# Patient Record
Sex: Female | Born: 1985
Health system: Southern US, Community
[De-identification: ages and names within clinical notes are randomized; demographics above are authoritative.]

## PROBLEM LIST (undated history)

## (undated) DIAGNOSIS — E785 Hyperlipidemia, unspecified: Secondary | ICD-10-CM

## (undated) DIAGNOSIS — E119 Type 2 diabetes mellitus without complications: Secondary | ICD-10-CM

## (undated) DIAGNOSIS — D259 Leiomyoma of uterus, unspecified: Secondary | ICD-10-CM

## (undated) DIAGNOSIS — N920 Excessive and frequent menstruation with regular cycle: Secondary | ICD-10-CM

## (undated) DIAGNOSIS — D649 Anemia, unspecified: Secondary | ICD-10-CM

## (undated) DIAGNOSIS — F909 Attention-deficit hyperactivity disorder, unspecified type: Secondary | ICD-10-CM

## (undated) DIAGNOSIS — D219 Benign neoplasm of connective and other soft tissue, unspecified: Secondary | ICD-10-CM

## (undated) DIAGNOSIS — N946 Dysmenorrhea, unspecified: Secondary | ICD-10-CM

## (undated) DIAGNOSIS — D500 Iron deficiency anemia secondary to blood loss (chronic): Secondary | ICD-10-CM

## (undated) HISTORY — DX: Excessive and frequent menstruation with regular cycle: N92.0

## (undated) HISTORY — PX: WISDOM TOOTH EXTRACTION: SHX21

## (undated) HISTORY — DX: Type 2 diabetes mellitus without complications: E11.9

## (undated) HISTORY — PX: NO PAST SURGERIES: SHX2092

## (undated) HISTORY — DX: Benign neoplasm of connective and other soft tissue, unspecified: D21.9

## (undated) HISTORY — DX: Anemia, unspecified: D64.9

---

## 2004-05-11 ENCOUNTER — Ambulatory Visit: Payer: Self-pay | Admitting: Nurse Practitioner

## 2005-07-09 ENCOUNTER — Ambulatory Visit: Payer: Self-pay | Admitting: Nurse Practitioner

## 2005-07-09 ENCOUNTER — Other Ambulatory Visit: Admission: RE | Admit: 2005-07-09 | Discharge: 2005-07-09 | Payer: Self-pay | Admitting: Family Medicine

## 2005-11-07 ENCOUNTER — Ambulatory Visit: Payer: Self-pay | Admitting: Nurse Practitioner

## 2005-11-09 ENCOUNTER — Ambulatory Visit: Payer: Self-pay | Admitting: Nurse Practitioner

## 2008-01-12 ENCOUNTER — Encounter (INDEPENDENT_AMBULATORY_CARE_PROVIDER_SITE_OTHER): Payer: Self-pay | Admitting: Family Medicine

## 2008-01-12 ENCOUNTER — Ambulatory Visit: Payer: Self-pay | Admitting: Internal Medicine

## 2008-01-12 LAB — CONVERTED CEMR LAB
Basophils Absolute: 0 10*3/uL (ref 0.0–0.1)
Basophils Relative: 1 % (ref 0–1)
Eosinophils Absolute: 0.1 10*3/uL (ref 0.0–0.7)
Eosinophils Relative: 2 % (ref 0–5)
HCT: 30.8 % — ABNORMAL LOW (ref 36.0–46.0)
Hemoglobin: 9.2 g/dL — ABNORMAL LOW (ref 12.0–15.0)
Lymphocytes Relative: 32 % (ref 12–46)
Lymphs Abs: 1.6 10*3/uL (ref 0.7–4.0)
MCHC: 29.9 g/dL — ABNORMAL LOW (ref 30.0–36.0)
MCV: 70 fL — ABNORMAL LOW (ref 78.0–100.0)
Monocytes Absolute: 0.5 10*3/uL (ref 0.1–1.0)
Monocytes Relative: 10 % (ref 3–12)
Neutro Abs: 2.8 10*3/uL (ref 1.7–7.7)
Neutrophils Relative %: 56 % (ref 43–77)
Platelets: 276 10*3/uL (ref 150–400)
RBC: 4.4 M/uL (ref 3.87–5.11)
RDW: 17.2 % — ABNORMAL HIGH (ref 11.5–15.5)
WBC: 4.9 10*3/uL (ref 4.0–10.5)

## 2008-01-13 ENCOUNTER — Ambulatory Visit: Payer: Self-pay | Admitting: *Deleted

## 2008-01-15 ENCOUNTER — Ambulatory Visit (HOSPITAL_COMMUNITY): Admission: RE | Admit: 2008-01-15 | Discharge: 2008-01-15 | Payer: Self-pay | Admitting: Internal Medicine

## 2008-03-05 ENCOUNTER — Ambulatory Visit: Payer: Self-pay | Admitting: Family Medicine

## 2008-03-05 ENCOUNTER — Encounter (INDEPENDENT_AMBULATORY_CARE_PROVIDER_SITE_OTHER): Payer: Self-pay | Admitting: Family Medicine

## 2008-03-05 LAB — CONVERTED CEMR LAB
ALT: 11 units/L (ref 0–35)
AST: 16 units/L (ref 0–37)
Albumin: 4.5 g/dL (ref 3.5–5.2)
Alkaline Phosphatase: 41 units/L (ref 39–117)
BUN: 11 mg/dL (ref 6–23)
Basophils Absolute: 0 10*3/uL (ref 0.0–0.1)
Basophils Relative: 1 % (ref 0–1)
CO2: 23 meq/L (ref 19–32)
Calcium: 9.2 mg/dL (ref 8.4–10.5)
Chlamydia, DNA Probe: NEGATIVE
Chloride: 104 meq/L (ref 96–112)
Creatinine, Ser: 1.15 mg/dL (ref 0.40–1.20)
Eosinophils Absolute: 0.1 10*3/uL (ref 0.0–0.7)
Eosinophils Relative: 1 % (ref 0–5)
GC Probe Amp, Genital: NEGATIVE
Glucose, Bld: 80 mg/dL (ref 70–99)
HCT: 29.7 % — ABNORMAL LOW (ref 36.0–46.0)
Hemoglobin: 8.6 g/dL — ABNORMAL LOW (ref 12.0–15.0)
Lymphocytes Relative: 30 % (ref 12–46)
Lymphs Abs: 1.2 10*3/uL (ref 0.7–4.0)
MCHC: 29 g/dL — ABNORMAL LOW (ref 30.0–36.0)
MCV: 70.2 fL — ABNORMAL LOW (ref 78.0–100.0)
Monocytes Absolute: 0.5 10*3/uL (ref 0.1–1.0)
Monocytes Relative: 13 % — ABNORMAL HIGH (ref 3–12)
Neutro Abs: 2.2 10*3/uL (ref 1.7–7.7)
Neutrophils Relative %: 55 % (ref 43–77)
Platelets: 238 10*3/uL (ref 150–400)
Potassium: 4 meq/L (ref 3.5–5.3)
RBC: 4.23 M/uL (ref 3.87–5.11)
RDW: 17.4 % — ABNORMAL HIGH (ref 11.5–15.5)
Sodium: 139 meq/L (ref 135–145)
Total Bilirubin: 0.3 mg/dL (ref 0.3–1.2)
Total Protein: 7.8 g/dL (ref 6.0–8.3)
WBC: 4 10*3/uL (ref 4.0–10.5)

## 2008-10-06 IMAGING — RF DG UGI W/ SMALL BOWEL HIGH DENSITY
14 of 24 series · 14 of 24 positions shown · non-contrast
Comparison: None available

CLINICAL DATA: Positive H pylori, persistent reflux

UPPER GI W/ SMALL BOWEL HIGH DENSITY
TECHNIQUE: Upper GI series performed with high density barium and
effervescent agent. Thin barium also used. Subsequently, serial
images of the small bowel were obtained including spot views of the
terminal ileum.

[Series 1: run · 1 of 1 slices shown (1 of 14)]
[im 1/1]
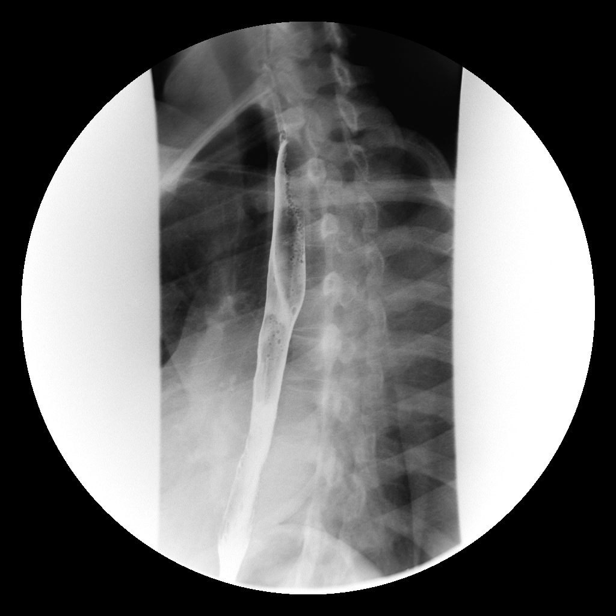

[Series 3: run · 1 of 1 slices shown (2 of 14)]
[im 1/1]
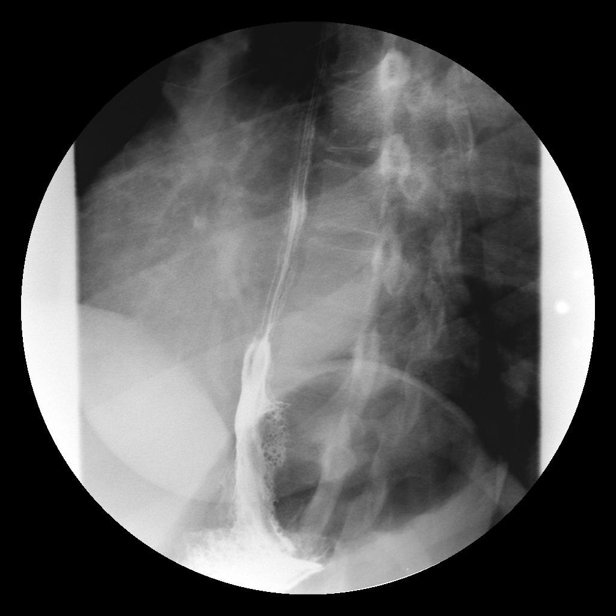

[Series 5: run · 1 of 1 slices shown (3 of 14)]
[im 1/1]
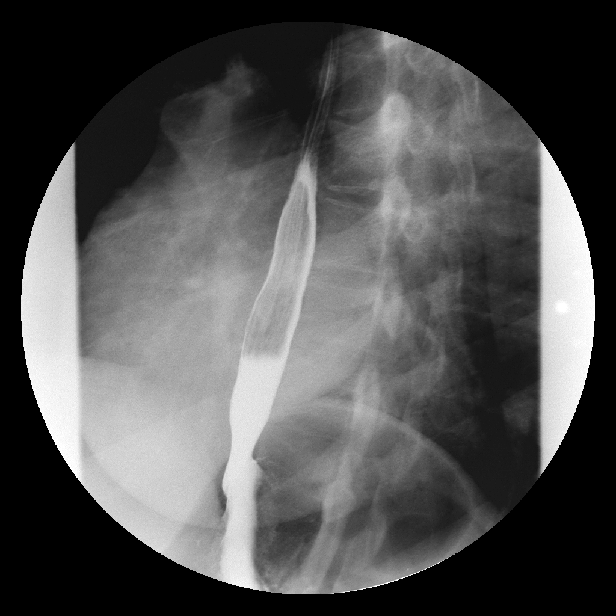

[Series 7: run · 1 of 1 slices shown (4 of 14)]
[im 1/1]
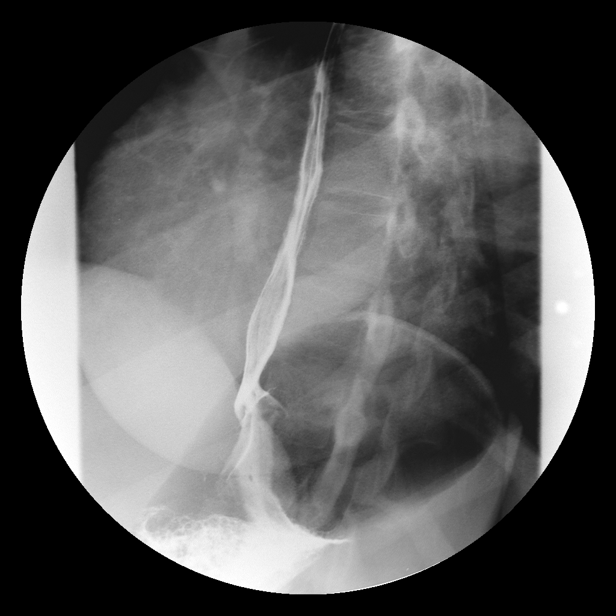

[Series 8: run · 1 of 1 slices shown (5 of 14)]
[im 1/1]
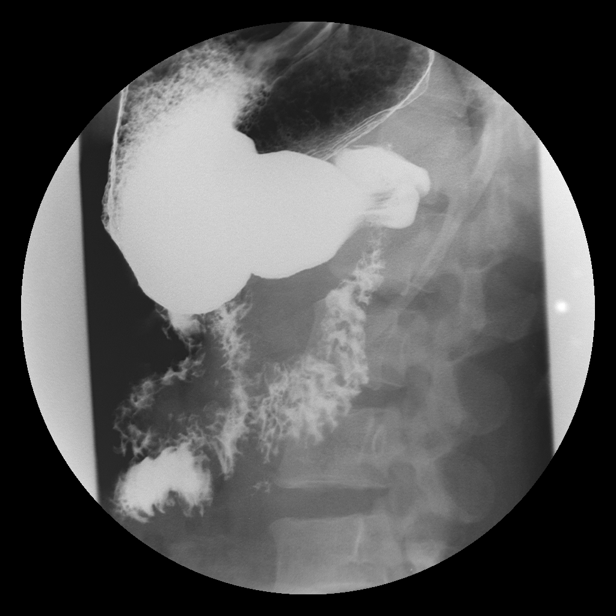

[Series 10: run · 1 of 1 slices shown (6 of 14)]
[im 1/1]
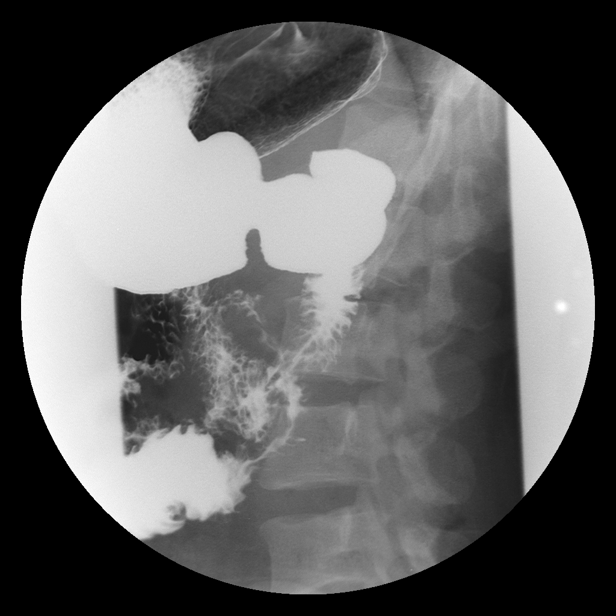

[Series 12: run · 1 of 1 slices shown (7 of 14)]
[im 1/1]
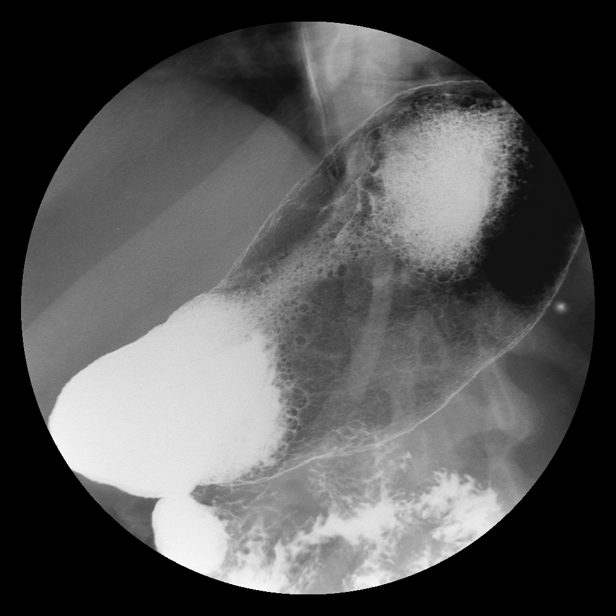

[Series 13: run · 1 of 1 slices shown (8 of 14)]
[im 1/1]
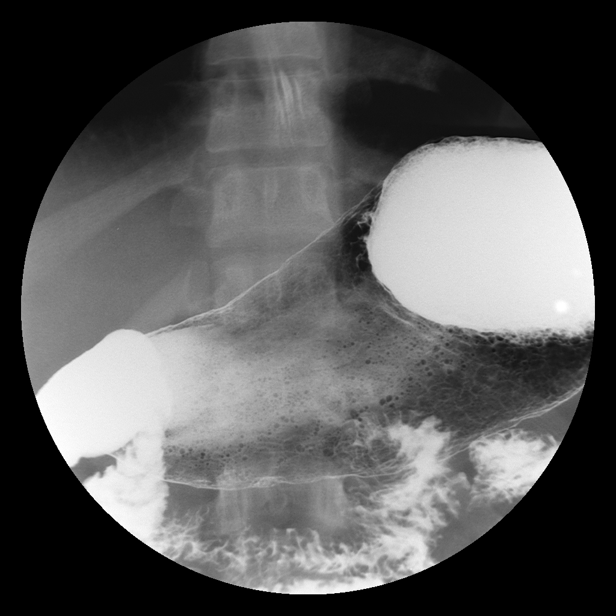

[Series 15: run · 1 of 1 slices shown (9 of 14)]
[im 1/1]
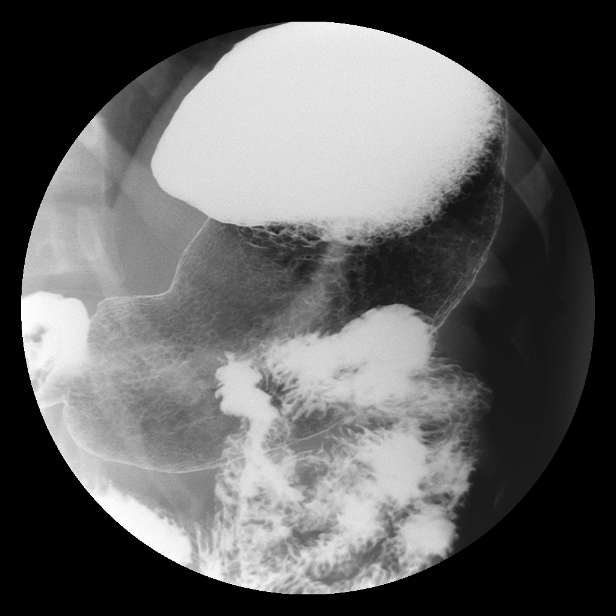

[Series 17: run · 1 of 1 slices shown (10 of 14)]
[im 1/1]
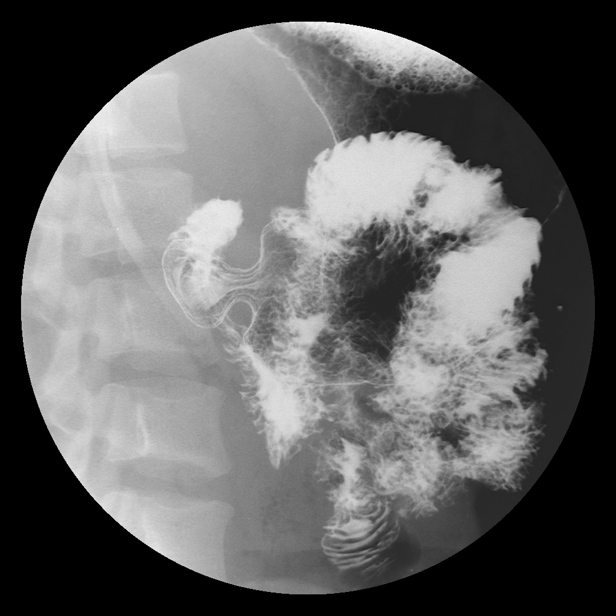

[Series 19: run · 1 of 1 slices shown (11 of 14)]
[im 1/1]
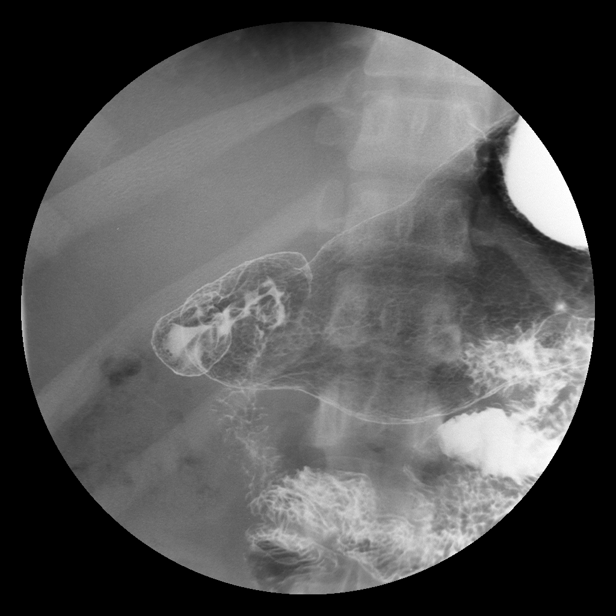

[Series 20: run · 1 of 1 slices shown (12 of 14)]
[im 1/1]
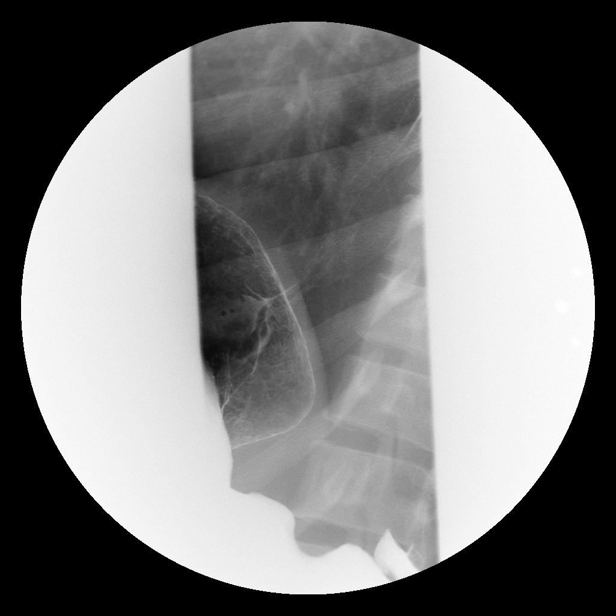

[Series 22: run · 1 of 1 slices shown (13 of 14)]
[im 1/1]
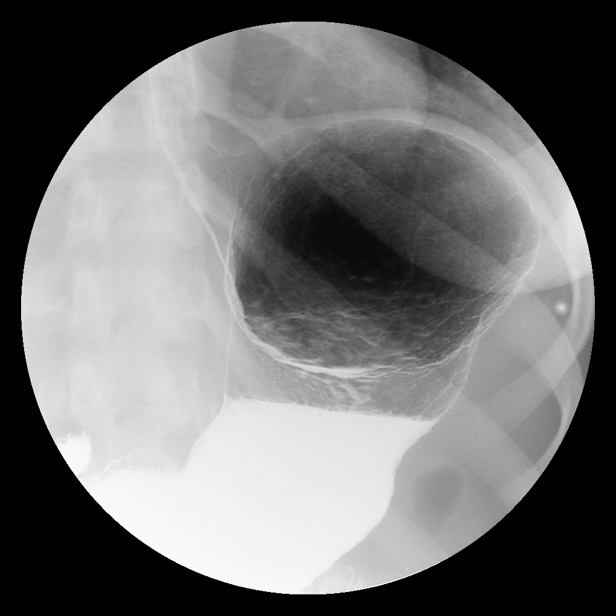

[Series 24: run · 1 of 1 slices shown (14 of 14)]
[im 1/1]
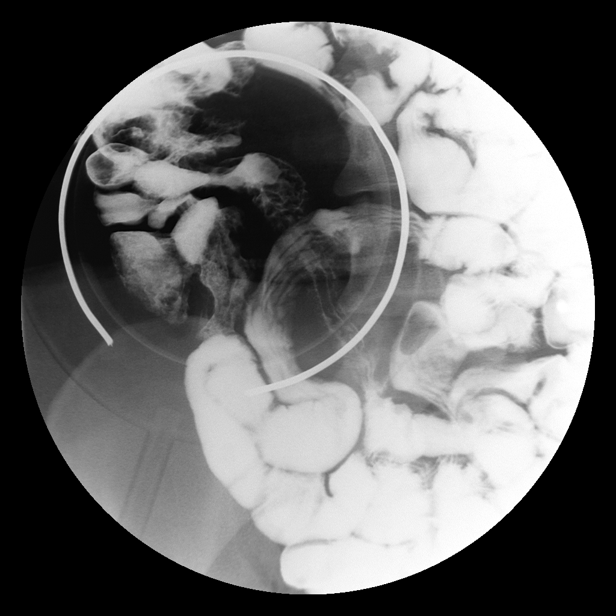

[14 of 24 positions shown; findings below may reference images not displayed]

FINDINGS: Esophagus:  No esophageal mucosal irregularity.  Normal esophageal
motility.  No evidence of distal stricture.  No hiatal hernia.  No
reflux demonstrated during this exam.  13 mm barium tablet passed
GE junction easily.

The stomach - duodenum:  No mucosal irregularity or mass in the
stomach or duodenum.  No evidence of obstruction.  Normal duodenal
C-loop.

Small bowel:  The small bowel transit time equal 40 minutes within
the with normal limits. No evidence of mass or mucosal irregularity
of the small bowel.  The terminal ileum is normal without evidence
of inflammation.
IMPRESSION: 1.  Normal upper GI series and small-bowel follow-through.
2..  No evidence of gastroesophageal reflux during this exam.

## 2009-12-27 ENCOUNTER — Ambulatory Visit: Payer: Self-pay | Admitting: Oncology

## 2009-12-30 LAB — MORPHOLOGY: PLT EST: ADEQUATE

## 2009-12-30 LAB — COMPREHENSIVE METABOLIC PANEL
ALT: 15 U/L (ref 0–35)
AST: 19 U/L (ref 0–37)
Chloride: 103 mEq/L (ref 96–112)
Creatinine, Ser: 0.95 mg/dL (ref 0.40–1.20)
Sodium: 137 mEq/L (ref 135–145)
Total Bilirubin: 0.4 mg/dL (ref 0.3–1.2)
Total Protein: 7.3 g/dL (ref 6.0–8.3)

## 2009-12-30 LAB — IRON AND TIBC
TIBC: 399 ug/dL (ref 250–470)
UIBC: 339 ug/dL

## 2009-12-30 LAB — TSH: TSH: 1.788 u[IU]/mL (ref 0.350–4.500)

## 2009-12-30 LAB — CBC WITH DIFFERENTIAL/PLATELET
BASO%: 0.6 % (ref 0.0–2.0)
LYMPH%: 32.7 % (ref 14.0–49.7)
MCHC: 29.9 g/dL — ABNORMAL LOW (ref 31.5–36.0)
MONO#: 0.3 10*3/uL (ref 0.1–0.9)
MONO%: 8 % (ref 0.0–14.0)
NEUT#: 1.9 10*3/uL (ref 1.5–6.5)
Platelets: 243 10*3/uL (ref 145–400)
RBC: 4.54 10*6/uL (ref 3.70–5.45)
RDW: 24.8 % — ABNORMAL HIGH (ref 11.2–14.5)
WBC: 3.4 10*3/uL — ABNORMAL LOW (ref 3.9–10.3)
nRBC: 0 % (ref 0–0)

## 2009-12-30 LAB — FERRITIN: Ferritin: 14 ng/mL (ref 10–291)

## 2009-12-30 LAB — FOLATE: Folate: >20 ng/mL

## 2010-01-03 LAB — HEMOGLOBINOPATHY EVALUATION
Hemoglobin Other: 0 % (ref 0.0–0.0)
Hgb A2 Quant: 2.2 % (ref 2.2–3.2)
Hgb A: 97.8 % (ref 96.8–97.8)
Hgb F Quant: 0 % (ref 0.0–2.0)
Hgb S Quant: 0 % (ref 0.0–0.0)

## 2010-03-29 ENCOUNTER — Ambulatory Visit: Payer: Self-pay | Admitting: Oncology

## 2010-03-31 LAB — COMPREHENSIVE METABOLIC PANEL
ALT: 15 U/L (ref 0–35)
AST: 18 U/L (ref 0–37)
Albumin: 4 g/dL (ref 3.5–5.2)
Alkaline Phosphatase: 29 U/L — ABNORMAL LOW (ref 39–117)
BUN: 9 mg/dL (ref 6–23)
CO2: 24 mEq/L (ref 19–32)
Calcium: 9.2 mg/dL (ref 8.4–10.5)
Chloride: 104 mEq/L (ref 96–112)
Creatinine, Ser: 0.91 mg/dL (ref 0.40–1.20)
Glucose, Bld: 100 mg/dL — ABNORMAL HIGH (ref 70–99)
Potassium: 4 mEq/L (ref 3.5–5.3)
Sodium: 136 mEq/L (ref 135–145)
Total Bilirubin: 0.2 mg/dL — ABNORMAL LOW (ref 0.3–1.2)
Total Protein: 6.7 g/dL (ref 6.0–8.3)

## 2010-03-31 LAB — CBC WITH DIFFERENTIAL/PLATELET
BASO%: 0.4 % (ref 0.0–2.0)
Basophils Absolute: 0 10*3/uL (ref 0.0–0.1)
EOS%: 1.8 % (ref 0.0–7.0)
Eosinophils Absolute: 0.1 10*3/uL (ref 0.0–0.5)
HCT: 33.7 % — ABNORMAL LOW (ref 34.8–46.6)
HGB: 11.6 g/dL (ref 11.6–15.9)
LYMPH%: 31.2 % (ref 14.0–49.7)
MCH: 28.6 pg (ref 25.1–34.0)
MCHC: 34.5 g/dL (ref 31.5–36.0)
MCV: 82.9 fL (ref 79.5–101.0)
MONO#: 0.5 10*3/uL (ref 0.1–0.9)
MONO%: 9.4 % (ref 0.0–14.0)
NEUT#: 2.9 10*3/uL (ref 1.5–6.5)
NEUT%: 57.2 % (ref 38.4–76.8)
Platelets: 269 10*3/uL (ref 145–400)
RBC: 4.06 10*6/uL (ref 3.70–5.45)
RDW: 13.9 % (ref 11.2–14.5)
WBC: 5.1 10*3/uL (ref 3.9–10.3)
lymph#: 1.6 10*3/uL (ref 0.9–3.3)

## 2010-03-31 LAB — IRON AND TIBC
%SAT: 72 % — ABNORMAL HIGH (ref 20–55)
Iron: 291 ug/dL — ABNORMAL HIGH (ref 42–145)
TIBC: 404 ug/dL (ref 250–470)
UIBC: 113 ug/dL

## 2010-03-31 LAB — HCG, SERUM, QUALITATIVE: Preg, Serum: NEGATIVE

## 2010-03-31 LAB — FERRITIN: Ferritin: 8 ng/mL — ABNORMAL LOW (ref 10–291)

## 2010-06-23 ENCOUNTER — Ambulatory Visit: Payer: Self-pay | Admitting: Oncology

## 2010-06-30 LAB — IRON AND TIBC
%SAT: 11 % — ABNORMAL LOW (ref 20–55)
Iron: 44 ug/dL (ref 42–145)
TIBC: 387 ug/dL (ref 250–470)
UIBC: 343 ug/dL

## 2010-06-30 LAB — FERRITIN: Ferritin: 14 ng/mL (ref 10–291)

## 2010-06-30 LAB — CBC WITH DIFFERENTIAL/PLATELET
BASO%: 0.3 % (ref 0.0–2.0)
Basophils Absolute: 0 10*3/uL (ref 0.0–0.1)
EOS%: 1.4 % (ref 0.0–7.0)
Eosinophils Absolute: 0.1 10*3/uL (ref 0.0–0.5)
HCT: 33 % — ABNORMAL LOW (ref 34.8–46.6)
HGB: 11.2 g/dL — ABNORMAL LOW (ref 11.6–15.9)
LYMPH%: 27.4 % (ref 14.0–49.7)
MCH: 29 pg (ref 25.1–34.0)
MCHC: 34 g/dL (ref 31.5–36.0)
MCV: 85.1 fL (ref 79.5–101.0)
MONO#: 0.3 10*3/uL (ref 0.1–0.9)
MONO%: 6.2 % (ref 0.0–14.0)
NEUT#: 3 10*3/uL (ref 1.5–6.5)
NEUT%: 64.7 % (ref 38.4–76.8)
Platelets: 303 10*3/uL (ref 145–400)
RBC: 3.87 10*6/uL (ref 3.70–5.45)
RDW: 12.3 % (ref 11.2–14.5)
WBC: 4.6 10*3/uL (ref 3.9–10.3)
lymph#: 1.3 10*3/uL (ref 0.9–3.3)

## 2010-06-30 LAB — COMPREHENSIVE METABOLIC PANEL
ALT: 15 U/L (ref 0–35)
AST: 15 U/L (ref 0–37)
Albumin: 4.2 g/dL (ref 3.5–5.2)
Alkaline Phosphatase: 30 U/L — ABNORMAL LOW (ref 39–117)
BUN: 12 mg/dL (ref 6–23)
CO2: 24 mEq/L (ref 19–32)
Calcium: 8.9 mg/dL (ref 8.4–10.5)
Chloride: 104 mEq/L (ref 96–112)
Creatinine, Ser: 1.07 mg/dL (ref 0.40–1.20)
Glucose, Bld: 121 mg/dL — ABNORMAL HIGH (ref 70–99)
Potassium: 3.9 mEq/L (ref 3.5–5.3)
Sodium: 139 mEq/L (ref 135–145)
Total Bilirubin: 0.3 mg/dL (ref 0.3–1.2)
Total Protein: 6.9 g/dL (ref 6.0–8.3)

## 2011-08-23 ENCOUNTER — Other Ambulatory Visit: Payer: Self-pay | Admitting: Family Medicine

## 2012-08-14 ENCOUNTER — Encounter (HOSPITAL_COMMUNITY): Payer: Self-pay | Admitting: Cardiology

## 2012-08-14 ENCOUNTER — Emergency Department (HOSPITAL_COMMUNITY)
Admission: EM | Admit: 2012-08-14 | Discharge: 2012-08-14 | Disposition: A | Discharge status: Home / Self Care | Payer: No Typology Code available for payment source | Attending: Emergency Medicine | Admitting: Emergency Medicine

## 2012-08-14 DIAGNOSIS — Y9389 Activity, other specified: Secondary | ICD-10-CM | POA: Insufficient documentation

## 2012-08-14 DIAGNOSIS — Y92410 Unspecified street and highway as the place of occurrence of the external cause: Secondary | ICD-10-CM | POA: Insufficient documentation

## 2012-08-14 DIAGNOSIS — IMO0002 Reserved for concepts with insufficient information to code with codable children: Secondary | ICD-10-CM | POA: Insufficient documentation

## 2012-08-14 MED ORDER — MELOXICAM 15 MG PO TABS: 15.0000 mg | ORAL_TABLET | Freq: Every day | ORAL | Status: DC
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
## 2012-08-15 NOTE — ED Provider Notes (Signed)
Medical screening examination/treatment/procedure(s) were performed by non-physician practitioner and as supervising physician I was immediately available for consultation/collaboration.  Rosell Khouri, MD 08/15/12 0049 

## 2013-02-07 ENCOUNTER — Encounter (HOSPITAL_COMMUNITY): Payer: Self-pay | Admitting: *Deleted

## 2013-02-07 ENCOUNTER — Emergency Department (HOSPITAL_COMMUNITY)
Admission: EM | Admit: 2013-02-07 | Discharge: 2013-02-07 | Disposition: A | Discharge status: Home / Self Care | Payer: PRIVATE HEALTH INSURANCE | Attending: Emergency Medicine | Admitting: Emergency Medicine

## 2013-02-07 DIAGNOSIS — Y9210 Unspecified residential institution as the place of occurrence of the external cause: Secondary | ICD-10-CM | POA: Insufficient documentation

## 2013-02-07 DIAGNOSIS — X500XXA Overexertion from strenuous movement or load, initial encounter: Secondary | ICD-10-CM | POA: Insufficient documentation

## 2013-02-07 DIAGNOSIS — S63509A Unspecified sprain of unspecified wrist, initial encounter: Secondary | ICD-10-CM | POA: Insufficient documentation

## 2013-02-07 DIAGNOSIS — Y93F2 Activity, caregiving, lifting: Secondary | ICD-10-CM | POA: Insufficient documentation

## 2013-02-07 DIAGNOSIS — S66911A Strain of unspecified muscle, fascia and tendon at wrist and hand level, right hand, initial encounter: Secondary | ICD-10-CM

## 2013-02-07 MED ORDER — IBUPROFEN 800 MG PO TABS: 800.0000 mg | ORAL_TABLET | Freq: Three times a day (TID) | ORAL | Status: DC

## 2013-02-07 NOTE — ED Provider Notes (Signed)
  CSN: [id number]     Arrival date & time 02/07/13  1618 History  This chart was scribed for non-physician practitioner, Fayrene Helper, PA-C working with Gavin Pound. Oletta Lamas, MD by Greggory Stallion, ED scribe. This patient was seen in room TR09C/TR09C and the patient's care was started at 4:37 PM.   Chief Complaint  Patient presents with  . Wrist Pain   The history is provided by the patient. No language interpreter was used.    HPI Comments: Tonya Gilbert is a 27 y.o. female who presents to the Emergency Department complaining of sudden onset, constant right wrist pain that started about 3 hours ago. 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 She did not hear a snap or pop in her wrist. Pt states the pain is increased when pushing and turning beds. She has wrapped it and put ice on it with some relief. Pt has never injured this wrist before.   History reviewed. No pertinent past medical history. History reviewed. No pertinent past surgical history. No family history on file. History  Substance Use Topics  . Smoking status: Never Smoker   . Smokeless tobacco: Not on file  . Alcohol Use: No   OB History   Grav Para Term Preterm Abortions TAB SAB Ect Mult Living                 Review of Systems  Musculoskeletal: Positive for arthralgias.  All other systems reviewed and are negative.    Allergies  Bee venom  Home Medications   Current Outpatient Rx  Name  Route  Sig  Dispense  Refill  . ibuprofen (ADVIL,MOTRIN) 200 MG tablet   Oral   Take 400 mg by mouth every 6 (six) hours as needed for pain.          BP 123/85  Pulse 67  Temp(Src) 98.6 F (37 C) (Oral)  Resp 16  SpO2 100%  LMP 02/01/2013  Physical Exam  Nursing note and vitals reviewed. Constitutional: She is oriented to person, place, and time. She appears well-developed and well-nourished. No distress.  HENT:  Head: Normocephalic and atraumatic.  Eyes: EOM are normal.  Neck: Neck supple. No  tracheal deviation present.  Cardiovascular: Normal rate.   Pulmonary/Chest: Effort normal. No respiratory distress.  Musculoskeletal: Normal range of motion.  Tenderness to ulnar aspects of right wrist. Normal wrist flexion and extension with some pain on lateral movement. Some pain with supination. No overlying skin changes. No deformity noted. Good grip strength.   Neurological: She is alert and oriented to person, place, and time.  Skin: Skin is warm and dry.  Psychiatric: She has a normal mood and affect. Her behavior is normal.    ED Course   Procedures (including critical care time)  DIAGNOSTIC STUDIES: Oxygen Saturation is 100% on RA, normal by my interpretation.    COORDINATION OF CARE: 5:10 PM-Discussed treatment plan which includes ibuprofen and re wrapping her wrist with pt at bedside and pt agreed to plan.   Labs Reviewed - No data to display No results found. 1. Wrist strain, right, initial encounter     MDM  BP 123/85  Pulse 67  Temp(Src) 98.6 F (37 C) (Oral)  Resp 16  SpO2 100%  LMP 02/01/2013  I personally performed the services described in this documentation, which was scribed in my presence. The recorded information has been reviewed and is accurate.    Fayrene Helper, PA-C 02/07/13 1712
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
## 2013-02-08 NOTE — ED Provider Notes (Signed)
Medical screening examination/treatment/procedure(s) were performed by non-physician practitioner and as supervising physician I was immediately available for consultation/collaboration.   Aliyyah Riese Y. Joniel Graumann, MD 02/08/13 1738 

## 2013-03-11 ENCOUNTER — Emergency Department (HOSPITAL_COMMUNITY)
Admission: EM | Admit: 2013-03-11 | Discharge: 2013-03-11 | Disposition: A | Discharge status: Home / Self Care | Payer: Self-pay | Attending: Emergency Medicine | Admitting: Emergency Medicine

## 2013-03-11 ENCOUNTER — Encounter (HOSPITAL_COMMUNITY): Payer: Self-pay | Admitting: Physical Medicine and Rehabilitation

## 2013-03-11 DIAGNOSIS — Z3202 Encounter for pregnancy test, result negative: Secondary | ICD-10-CM | POA: Insufficient documentation

## 2013-03-11 DIAGNOSIS — Z791 Long term (current) use of non-steroidal anti-inflammatories (NSAID): Secondary | ICD-10-CM | POA: Insufficient documentation

## 2013-03-11 DIAGNOSIS — R55 Syncope and collapse: Secondary | ICD-10-CM | POA: Insufficient documentation

## 2013-03-11 DIAGNOSIS — R42 Dizziness and giddiness: Secondary | ICD-10-CM | POA: Insufficient documentation

## 2013-03-11 DIAGNOSIS — D649 Anemia, unspecified: Secondary | ICD-10-CM | POA: Insufficient documentation

## 2013-03-11 LAB — COMPREHENSIVE METABOLIC PANEL
ALT: 15 U/L (ref 0–35)
Albumin: 3.9 g/dL (ref 3.5–5.2)
Alkaline Phosphatase: 45 U/L (ref 39–117)
Chloride: 98 mEq/L (ref 96–112)
Potassium: 3.7 mEq/L (ref 3.5–5.1)
Sodium: 133 mEq/L — ABNORMAL LOW (ref 135–145)
Total Bilirubin: 0.1 mg/dL — ABNORMAL LOW (ref 0.3–1.2)
Total Protein: 8 g/dL (ref 6.0–8.3)

## 2013-03-11 LAB — CBC WITH DIFFERENTIAL/PLATELET
Basophils Absolute: 0.1 10*3/uL (ref 0.0–0.1)
Basophils Relative: 1 % (ref 0–1)
Eosinophils Absolute: 0.1 10*3/uL (ref 0.0–0.7)
HCT: 29 % — ABNORMAL LOW (ref 36.0–46.0)
Hemoglobin: 8.4 g/dL — ABNORMAL LOW (ref 12.0–15.0)
Lymphocytes Relative: 39 % (ref 12–46)
MCHC: 29 g/dL — ABNORMAL LOW (ref 30.0–36.0)
Monocytes Relative: 10 % (ref 3–12)
Neutrophils Relative %: 48 % (ref 43–77)
WBC: 5.7 10*3/uL (ref 4.0–10.5)

## 2013-03-11 NOTE — ED Notes (Signed)
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 Upon reaching destination, patient began to feel dizzy (as if the room was spinning) and felt as if she was going to pass out. Was assisted by staff. Sat down, rested and drank some water. Felt better. Denies any diaphoresis, chills, nausea, vomiting or headache. LMP 2 weeks ago. Last meal was at 1430. No hx seizures or syncope. Patient AAOx4, resp e/u, neuro intact and NAD.

## 2013-03-11 NOTE — ED Provider Notes (Signed)
CSN: [id number]     Arrival date & time 03/11/13  1526 History   First MD Initiated Contact with Patient 03/11/13 1658     Chief Complaint  Patient presents with  . Near Syncope   (Consider location/radiation/quality/duration/timing/severity/associated sxs/prior Treatment) HPI Comments: Pt states she walked up several flights of stairs, became hot, dizzy (room spinning), and felt like she would pass out.  No SOB, CP, palpitations, n/v, d/a, recent illness, injury, h/a, numbness, weakness.  Symptoms since resolved, pt able to ambulate to bathroom w/o symtpoms. Hx of similar 2-3 times a months when weather is hot.   Patient is a 27 y.o. female presenting with neurologic complaint.  Neurologic Problem This is a new problem. The current episode started 1 to 2 hours ago. The problem occurs rarely. The problem has been resolved. Pertinent negatives include no chest pain, no abdominal pain, no headaches and no shortness of breath. The symptoms are aggravated by standing. The symptoms are relieved by lying down. She has tried water for the symptoms. The treatment provided moderate relief.    No past medical history on file. No past surgical history on file. No family history on file. History  Substance Use Topics  . Smoking status: Never Smoker   . Smokeless tobacco: Not on file  . Alcohol Use: No   OB History   Grav Para Term Preterm Abortions TAB SAB Ect Mult Living                 Review of Systems  Constitutional: Negative for fever, chills, diaphoresis, activity change, appetite change and fatigue.  HENT: Negative for congestion, sore throat, facial swelling, rhinorrhea, neck pain and neck stiffness.   Eyes: Negative for photophobia and discharge.  Respiratory: Negative for cough, chest tightness and shortness of breath.   Cardiovascular: Negative for chest pain, palpitations and leg swelling.  Gastrointestinal: Negative for nausea, vomiting, abdominal pain and diarrhea.  Endocrine:  Negative for polydipsia and polyuria.  Genitourinary: Negative for dysuria, frequency, difficulty urinating and pelvic pain.  Musculoskeletal: Negative for back pain and arthralgias.  Skin: Negative for color change and wound.  Allergic/Immunologic: Negative for immunocompromised state.  Neurological: Positive for dizziness. Negative for facial asymmetry, weakness, numbness and headaches.  Hematological: Does not bruise/bleed easily.  Psychiatric/Behavioral: Negative for confusion and agitation.    Allergies  Bee venom  Home Medications   Current Outpatient Rx  Name  Route  Sig  Dispense  Refill  . ibuprofen (ADVIL,MOTRIN) 800 MG tablet   Oral   Take 1 tablet (800 mg total) by mouth 3 (three) times daily.   21 tablet   0    BP 119/73  Pulse 70  Temp(Src) 98.6 F (37 C) (Oral)  Resp 18  SpO2 100% Physical Exam  Constitutional: She is oriented to person, place, and time. She appears well-developed and well-nourished. No distress.  HENT:  Head: Normocephalic and atraumatic.  Mouth/Throat: No oropharyngeal exudate.  Eyes: Pupils are equal, round, and reactive to light.  Neck: Normal range of motion. Neck supple.  Cardiovascular: Normal rate, regular rhythm and normal heart sounds.  Exam reveals no gallop and no friction rub.   No murmur heard. Pulmonary/Chest: Effort normal and breath sounds normal. No respiratory distress. She has no wheezes. She has no rales.  Abdominal: Soft. Bowel sounds are normal. She exhibits no distension and no mass. There is no tenderness. There is no rebound and no guarding.  Musculoskeletal: Normal range of motion. She exhibits no edema and  no tenderness.  Neurological: She is alert and oriented to person, place, and time. She displays no tremor. No cranial nerve deficit or sensory deficit. She exhibits normal muscle tone. Coordination and gait normal. GCS eye subscore is 4. GCS verbal subscore is 5. GCS motor subscore is 6.  Skin: Skin is warm and  dry.  Psychiatric: She has a normal mood and affect.    ED Course  Procedures (including critical care time) Labs Review Labs Reviewed  CBC WITH DIFFERENTIAL - Abnormal; Notable for the following:    Hemoglobin 8.4 (*)    HCT 29.0 (*)    MCV 63.3 (*)    MCH 18.3 (*)    MCHC 29.0 (*)    RDW 19.2 (*)    All other components within normal limits  COMPREHENSIVE METABOLIC PANEL - Abnormal; Notable for the following:    Sodium 133 (*)    Glucose, Bld 114 (*)    Total Bilirubin 0.1 (*)    All other components within normal limits  GLUCOSE, CAPILLARY - Abnormal; Notable for the following:    Glucose-Capillary 112 (*)    All other components within normal limits  POCT PREGNANCY, URINE   Imaging Review No results found.  MDM   1. Near syncope   2. Anemia    Pt is a 27 y.o. female with Pmhx as above including anemia, who presents with near syncopal episode. Pt states she walked up several flights of stairs, became hot, dizzy (room spinning), and felt like she would pass out.  No SOB, CP, palpitations, n/v, d/a, recent illness, injury, h/a, numbness, weakness.  Symptoms since resolved, pt able to ambulate to bathroom w/o symtpoms. Hx of similar 2-3 times a months when weather is hot. On PE, VSS, pt in NAD, neuro exam unremarkable including Dix-Hallpike. Orthostatics negative. CBC, BMP, glu, POC preg ordered from triage, were unremarkable except for Hb 8.4.  Pt will start taking be multivitamin regularly and will f/u with PCP to discuss whether she should be back on iron.     1. Near syncope   2. Anemia         Shanna Cisco, MD 03/12/13 [phone]

## 2013-03-11 NOTE — ED Notes (Signed)
MD at bedside. 

## 2013-03-11 NOTE — ED Notes (Signed)
Pt presents to department for evaluation of near syncope. Pt was working when she became very dizzy, another staff member helped ease her to floor before falling. States she almost passed out. Now states she feels very tired. She is alert and oriented x4. Denies pain.

## 2013-12-21 ENCOUNTER — Telehealth: Payer: Self-pay | Admitting: Hematology and Oncology

## 2013-12-21 NOTE — Telephone Encounter (Signed)
LEFT MESSAGE FOR PATIENT AND GVE NP APPT FOR 07/06 @ 2 W/DR. [place].  [place] PACKET MAILED

## 2013-12-28 ENCOUNTER — Ambulatory Visit: Payer: Self-pay | Admitting: Hematology and Oncology

## 2013-12-28 ENCOUNTER — Ambulatory Visit: Payer: Self-pay

## 2015-07-01 DIAGNOSIS — E559 Vitamin D deficiency, unspecified: Secondary | ICD-10-CM | POA: Diagnosis not present

## 2015-07-01 DIAGNOSIS — E785 Hyperlipidemia, unspecified: Secondary | ICD-10-CM | POA: Diagnosis not present

## 2015-07-01 DIAGNOSIS — R739 Hyperglycemia, unspecified: Secondary | ICD-10-CM | POA: Diagnosis not present

## 2015-07-01 DIAGNOSIS — E669 Obesity, unspecified: Secondary | ICD-10-CM | POA: Diagnosis not present

## 2015-07-01 DIAGNOSIS — R7303 Prediabetes: Secondary | ICD-10-CM | POA: Diagnosis not present

## 2015-07-01 DIAGNOSIS — R635 Abnormal weight gain: Secondary | ICD-10-CM | POA: Diagnosis not present

## 2015-07-08 DIAGNOSIS — D649 Anemia, unspecified: Secondary | ICD-10-CM | POA: Diagnosis not present

## 2015-07-25 DIAGNOSIS — E559 Vitamin D deficiency, unspecified: Secondary | ICD-10-CM | POA: Diagnosis not present

## 2015-07-25 DIAGNOSIS — R7303 Prediabetes: Secondary | ICD-10-CM | POA: Diagnosis not present

## 2015-07-25 DIAGNOSIS — R739 Hyperglycemia, unspecified: Secondary | ICD-10-CM | POA: Diagnosis not present

## 2015-07-25 DIAGNOSIS — D649 Anemia, unspecified: Secondary | ICD-10-CM | POA: Diagnosis not present

## 2015-07-25 DIAGNOSIS — R635 Abnormal weight gain: Secondary | ICD-10-CM | POA: Diagnosis not present

## 2015-07-25 DIAGNOSIS — E669 Obesity, unspecified: Secondary | ICD-10-CM | POA: Diagnosis not present

## 2015-07-25 DIAGNOSIS — E785 Hyperlipidemia, unspecified: Secondary | ICD-10-CM | POA: Diagnosis not present

## 2015-08-24 DIAGNOSIS — D649 Anemia, unspecified: Secondary | ICD-10-CM | POA: Diagnosis not present

## 2015-08-24 DIAGNOSIS — R7303 Prediabetes: Secondary | ICD-10-CM | POA: Diagnosis not present

## 2015-08-24 DIAGNOSIS — E785 Hyperlipidemia, unspecified: Secondary | ICD-10-CM | POA: Diagnosis not present

## 2015-08-24 DIAGNOSIS — E669 Obesity, unspecified: Secondary | ICD-10-CM | POA: Diagnosis not present

## 2015-08-24 DIAGNOSIS — R635 Abnormal weight gain: Secondary | ICD-10-CM | POA: Diagnosis not present

## 2015-08-24 DIAGNOSIS — R739 Hyperglycemia, unspecified: Secondary | ICD-10-CM | POA: Diagnosis not present

## 2015-08-24 DIAGNOSIS — E559 Vitamin D deficiency, unspecified: Secondary | ICD-10-CM | POA: Diagnosis not present

## 2015-09-01 MED FILL — PHENTERMINE 37.5 MG TABLET: 37.5 | 30 days supply | Qty: 30 | Fill #0

## 2015-09-23 ENCOUNTER — Telehealth: Payer: Self-pay | Admitting: Hematology and Oncology

## 2015-10-05 ENCOUNTER — Telehealth: Payer: Self-pay | Admitting: *Deleted

## 2015-10-05 DIAGNOSIS — E559 Vitamin D deficiency, unspecified: Secondary | ICD-10-CM | POA: Diagnosis not present

## 2015-10-05 DIAGNOSIS — R739 Hyperglycemia, unspecified: Secondary | ICD-10-CM | POA: Diagnosis not present

## 2015-10-05 DIAGNOSIS — D649 Anemia, unspecified: Secondary | ICD-10-CM | POA: Diagnosis not present

## 2015-10-05 DIAGNOSIS — E669 Obesity, unspecified: Secondary | ICD-10-CM | POA: Diagnosis not present

## 2015-10-05 DIAGNOSIS — E785 Hyperlipidemia, unspecified: Secondary | ICD-10-CM | POA: Diagnosis not present

## 2015-10-05 DIAGNOSIS — R635 Abnormal weight gain: Secondary | ICD-10-CM | POA: Diagnosis not present

## 2015-10-05 DIAGNOSIS — R7303 Prediabetes: Secondary | ICD-10-CM | POA: Diagnosis not present

## 2015-10-05 NOTE — Telephone Encounter (Signed)
Patient called and left message regarding referral for iron treatment. I have forwarded message to the lead scheduler

## 2015-10-06 ENCOUNTER — Telehealth: Payer: Self-pay | Admitting: Hematology and Oncology

## 2015-10-06 NOTE — Telephone Encounter (Signed)
Returned patient call re being referred for an iron infusion. Left message for patient asking that she call me re her initial message. No new patient referral information at this time for patient.

## 2015-10-12 DIAGNOSIS — D500 Iron deficiency anemia secondary to blood loss (chronic): Secondary | ICD-10-CM | POA: Diagnosis not present

## 2015-10-24 ENCOUNTER — Ambulatory Visit: Payer: Self-pay | Admitting: Hematology

## 2015-10-25 ENCOUNTER — Ambulatory Visit (HOSPITAL_BASED_OUTPATIENT_CLINIC_OR_DEPARTMENT_OTHER): Payer: 59 | Admitting: Hematology

## 2015-10-25 ENCOUNTER — Ambulatory Visit (HOSPITAL_BASED_OUTPATIENT_CLINIC_OR_DEPARTMENT_OTHER): Payer: 59

## 2015-10-25 ENCOUNTER — Encounter: Payer: Self-pay | Admitting: Hematology

## 2015-10-25 ENCOUNTER — Telehealth: Payer: Self-pay | Admitting: Hematology

## 2015-10-25 ENCOUNTER — Telehealth: Payer: Self-pay | Admitting: *Deleted

## 2015-10-25 VITALS — BP 118/77 | HR 85 | Temp 98.3°F | Resp 17 | Ht 65.0 in | Wt 193.3 lb

## 2015-10-25 DIAGNOSIS — D509 Iron deficiency anemia, unspecified: Secondary | ICD-10-CM | POA: Diagnosis not present

## 2015-10-25 DIAGNOSIS — D473 Essential (hemorrhagic) thrombocythemia: Secondary | ICD-10-CM | POA: Diagnosis not present

## 2015-10-25 DIAGNOSIS — D75838 Other thrombocytosis: Secondary | ICD-10-CM

## 2015-10-25 DIAGNOSIS — R7989 Other specified abnormal findings of blood chemistry: Secondary | ICD-10-CM

## 2015-10-25 LAB — COMPREHENSIVE METABOLIC PANEL
ALT: 15 U/L (ref 0–55)
AST: 16 U/L (ref 5–34)
Albumin: 3.9 g/dL (ref 3.5–5.0)
Alkaline Phosphatase: 51 U/L (ref 40–150)
Anion Gap: 8 mEq/L (ref 3–11)
BUN: 14.8 mg/dL (ref 7.0–26.0)
CO2: 27 meq/L (ref 22–29)
Calcium: 9.5 mg/dL (ref 8.4–10.4)
Chloride: 102 mEq/L (ref 98–109)
Creatinine: 1.2 mg/dL — ABNORMAL HIGH (ref 0.6–1.1)
EGFR: 71 mL/min/{1.73_m2} — AB (ref >90)
GLUCOSE: 94 mg/dL (ref 70–140)
Potassium: 3.9 mEq/L (ref 3.5–5.1)
SODIUM: 138 meq/L (ref 136–145)
TOTAL PROTEIN: 7.7 g/dL (ref 6.4–8.3)

## 2015-10-25 LAB — CBC & DIFF AND RETIC
BASO%: 0.2 % (ref 0.0–2.0)
Basophils Absolute: 0 10*3/uL (ref 0.0–0.1)
EOS ABS: 0.1 10*3/uL (ref 0.0–0.5)
EOS%: 2.3 % (ref 0.0–7.0)
HCT: 38.2 % (ref 34.8–46.6)
HEMOGLOBIN: 12.5 g/dL (ref 11.6–15.9)
Immature Retic Fract: 3.8 % (ref 1.60–10.00)
LYMPH%: 29.4 % (ref 14.0–49.7)
MCH: 26.6 pg (ref 25.1–34.0)
MCHC: 32.7 g/dL (ref 31.5–36.0)
MCV: 81.3 fL (ref 79.5–101.0)
MONO#: 0.5 10*3/uL (ref 0.1–0.9)
MONO%: 8.4 % (ref 0.0–14.0)
NEUT%: 59.7 % (ref 38.4–76.8)
NEUTROS ABS: 3.7 10*3/uL (ref 1.5–6.5)
Platelets: 290 10*3/uL (ref 145–400)
RBC: 4.7 10*6/uL (ref 3.70–5.45)
RDW: 13.9 % (ref 11.2–14.5)
RETIC %: 1.65 % (ref 0.70–2.10)
Retic Ct Abs: 77.55 10*3/uL (ref 33.70–90.70)
WBC: 6.2 10*3/uL (ref 3.9–10.3)
lymph#: 1.8 10*3/uL (ref 0.9–3.3)

## 2015-10-25 NOTE — Telephone Encounter (Signed)
Per staff message and POF I have scheduled appts. Advised scheduler of appts. JMW  

## 2015-10-25 NOTE — Telephone Encounter (Signed)
per pfo to sch pt appt-gave pt copy of avs-MW sch fera-sent back to lab

## 2015-10-26 ENCOUNTER — Encounter: Payer: Self-pay | Admitting: *Deleted

## 2015-10-26 LAB — IRON AND TIBC
%SAT: 45 % (ref 21–57)
Iron: 158 ug/dL — ABNORMAL HIGH (ref 41–142)
TIBC: 355 ug/dL (ref 236–444)
UIBC: 197 ug/dL (ref 120–384)

## 2015-10-26 LAB — FERRITIN: Ferritin: 12 ng/ml (ref 9–269)

## 2015-10-26 MED ORDER — FERROUS SULFATE 325 (65 FE) MG PO TABS: 325.0000 mg | ORAL_TABLET | Freq: Two times a day (BID) | ORAL | Status: DC

## 2015-10-26 MED ORDER — THERA VITAL M PO TABS: 1.0000 | ORAL_TABLET | Freq: Every day | ORAL | Status: DC

## 2015-10-26 NOTE — Progress Notes (Signed)
[name]    HEMATOLOGY/ONCOLOGY CONSULTATION NOTE  Date of Service: 10/26/2015  PCP: Doristine Section Bonsu MD CHIEF COMPLAINTS/PURPOSE OF CONSULTATION:  Iron deficiency anemia.  HISTORY OF PRESENTING ILLNESS:   Tonya Gilbert is a wonderful 30 y.o. female who has been referred to [place] by Dr Benito Mccreedy MD for evaluation and management of  Iron deficiency anemia.  Patient is from [place] (speaks [place]) she is conversant in [place]. Has a history of obesity, iron deficiency anemia, vitamin D deficiency and dyslipidemia and was noted by her primary care physician on labs on 07/08/2015 to have significant anemia with a hemoglobin of 6.9 MCV 58 RDW of 19.3 and platelet count of 421. At the time her ferritin level was 3 and iron saturation of 3%. Vitamin B12 was 1289 folate 18.6. TSH was within normal limits at 0.65. [name]  She notes that she was started on ferrous sulfate 1 tablet daily that she has been taking in a compliant fashion. Notes some mild pica symptoms with ice craving. Minimal fatigue but does not feel it is limiting her work. Has been trying to work out regularly.   Notes that she was given a referral to gastroenterology for a colonoscopy but that they mentioned that they would like to get stool studies first.  Notes regular menstrual periods lasting 7 days with 1-2 heavy days of blood loss. No change in bowel habits. No overt blood in the stools or black stools. No overt hematuria. No other evidence of overt blood loss.  Notes that she has a fairly balanced diet with no dietary restrictions. No fevers/chills/night sweats/unexpected weight loss.  She is on phentermine for weight loss for her obesity.  No other acute new symptoms.  MEDICAL HISTORY:   #1 obesity #2 iron deficiency anemia #3 hypercholesterolemia not on medications  #4 obesity - on phentermine for weight loss #5 history of vitamin D deficiency #6 history of pre-diabetes  SURGICAL HISTORY: No previous  surgeries  SOCIAL HISTORY: Social History   Social History  . Marital Status: Single    Spouse Name: N/A  . Number of Children: N/A  . Years of Education: N/A   Occupational History  . Not on file.   Social History Main Topics  . Smoking status: Never Smoker   . Smokeless tobacco: Not on file  . Alcohol Use: No  . Drug Use: No  . Sexual Activity: Not on file   Other Topics Concern  . Not on file   Social History Narrative    FAMILY HISTORY: No family history of blood disorders or cancer that she is aware of.  ALLERGIES:  is allergic to bee venom.  MEDICATIONS:  Current Outpatient Prescriptions  Medication Sig Dispense Refill  . ferrous sulfate 325 (65 FE) MG tablet Take 1 tablet (325 mg total) by mouth 2 (two) times daily with a meal. 180 tablet 3  . Multiple Vitamins-Minerals (MULTIVITAMIN) tablet Take 1 tablet by mouth daily.    . phentermine (ADIPEX-P) 37.5 MG tablet   2   No current facility-administered medications for this visit.    REVIEW OF SYSTEMS:    10 Point review of Systems was done is negative except as noted above.  PHYSICAL EXAMINATION: ECOG PERFORMANCE STATUS: 1 - Symptomatic but completely ambulatory  . Filed Vitals:   10/25/15 1518  BP: 118/77  Pulse: 85  Temp: 98.3 F (36.8 C)  Resp: 17   Filed Weights   10/25/15 1518  Weight: 193 lb 4.8 oz (87.68 kg)   .  Body mass index is 32.17 kg/(m^2).  GENERAL:alert, in no acute distress and comfortable SKIN: skin color, texture, turgor are normal, no rashes or significant lesions EYES: normal, conjunctiva are pink and non-injected, sclera clear OROPHARYNX:no exudate, no erythema and lips, buccal mucosa, and tongue normal  NECK: supple, no JVD, thyroid normal size, non-tender, without nodularity LYMPH:  no palpable lymphadenopathy in the cervical, axillary or inguinal LUNGS: clear to auscultation with normal respiratory effort HEART: regular rate & rhythm,  no murmurs and no lower  extremity edema ABDOMEN: abdomen soft, non-tender, normoactive bowel sounds , no palpable hepatosplenomegaly Musculoskeletal: no cyanosis of digits and no clubbing  PSYCH: alert & oriented x 3 with fluent speech NEURO: no focal motor/sensory deficits  LABORATORY DATA:  I have reviewed the data as listed  . CBC Latest Ref Rng 10/25/2015 03/11/2013 06/30/2010  WBC 3.9 - 10.3 10e3/uL 6.2 5.7 4.6  Hemoglobin 11.6 - 15.9 g/dL 12.5 8.4(L) 11.2(L)  Hematocrit 34.8 - 46.6 % 38.2 29.0(L) 33.0(L)  Platelets 145 - 400 10e3/uL 290 400 303   . CBC    Component Value Date/Time   WBC 6.2 10/25/2015 1608   WBC 5.7 03/11/2013 1723   RBC 4.70 10/25/2015 1608   RBC 4.58 03/11/2013 1723   HGB 12.5 10/25/2015 1608   HGB 8.4* 03/11/2013 1723   HCT 38.2 10/25/2015 1608   HCT 29.0* 03/11/2013 1723   PLT 290 10/25/2015 1608   PLT 400 03/11/2013 1723   MCV 81.3 10/25/2015 1608   MCV 63.3* 03/11/2013 1723   MCH 26.6 10/25/2015 1608   MCH 18.3* 03/11/2013 1723   MCHC 32.7 10/25/2015 1608   MCHC 29.0* 03/11/2013 1723   RDW 13.9 10/25/2015 1608   RDW 19.2* 03/11/2013 1723   LYMPHSABS 1.8 10/25/2015 1608   LYMPHSABS 2.2 03/11/2013 1723   MONOABS 0.5 10/25/2015 1608   MONOABS 0.6 03/11/2013 1723   EOSABS 0.1 10/25/2015 1608   EOSABS 0.1 03/11/2013 1723   BASOSABS 0.0 10/25/2015 1608   BASOSABS 0.1 03/11/2013 1723     . CMP Latest Ref Rng 10/25/2015 03/11/2013 06/30/2010  Glucose 70 - 140 mg/dl 94 114(H) 121(H)  BUN 7.0 - 26.0 mg/dL 14.8 11 12   Creatinine 0.6 - 1.1 mg/dL 1.2(H) 0.82 1.07  Sodium 136 - 145 mEq/L 138 133(L) 139  Potassium 3.5 - 5.1 mEq/L 3.9 3.7 3.9  Chloride 96 - 112 mEq/L - 98 104  CO2 22 - 29 mEq/L 27 25 24   Calcium 8.4 - 10.4 mg/dL 9.5 9.4 8.9  Total Protein 6.4 - 8.3 g/dL 7.7 8.0 6.9  Total Bilirubin 0.20 - 1.20 mg/dL <0.30 0.1(L) 0.3  Alkaline Phos 40 - 150 U/L 51 45 30(L)  AST 5 - 34 U/L 16 24 15   ALT 0 - 55 U/L 15 15 15      RADIOGRAPHIC STUDIES: I have personally  reviewed the radiological images as listed and agreed with the findings in the report. No results found.  ASSESSMENT & PLAN:   29 year old female from [place] with  #1 Iron deficiency anemia Patient had a hemoglobin of 6.9 with an MCV of 58, ferritin level of 3 with 3% iron saturation in January 2017. Had some mild reactive thrombocytosis at the time and normal WBC. She has been on iron replacement with oral ferrous sulfate 1 tablet by mouth daily. Labs today show that her hemoglobin has normalized at 12.5 and the MCV has normalized to 81.3  #2 reactive thrombocytosis -now normalized after iron replacement . Plan -Patient's labs  have significantly improved with oral iron replacement .this suggests no issues with oral iron absorption .she is tolerating oral iron okay at this point .no significant overt ongoing blood loss. No significant symptoms of iron deficiency at this time . -Would recommend that the patient continue oral ferrous sulfate 1 tablet by mouth twice a day with meals and preferably with orange juice to her ferritin levels are close to 100 to ensure adequate stores . -Patient has already been evaluated by gastroenterology and is in the process of getting stool studies for occult blood .she notes that they will decide on endoscopies based on that . -Will recommend taking a daily multivitamin . -The fact that her hemoglobin and microcytosis completely corrected with iron replacement rules out the possibility of an underlying hemoglobinopathy.  We'll have the patient follow up with her primary care physician in 2 months with repeat labs Cbc, CMP, ferritin to ensure adequate iron replacement .  Please reconsult [place] if any new questions or concerns arise .  All of the patients questions were answered with apparent satisfaction. The patient knows to call the clinic with any problems, questions or concerns.  I spent 45 minutes counseling the patient face to face. The total time  spent in the appointment was 45 minutes and more than 50% was on counseling and direct patient cares.    Sullivan Lone MD [place] AAHIVMS [facility] Hematology/Oncology Physician [facility]  (Office):       [phone] (Work cell):  [phone] (Fax):           [phone]

## 2015-10-26 NOTE — Progress Notes (Signed)
Called patient and left voicemail for her to return my call concerning cancelled infusion/MD appointment due to her labs returning to normal

## 2015-10-28 ENCOUNTER — Ambulatory Visit: Payer: Self-pay

## 2015-11-04 ENCOUNTER — Ambulatory Visit: Payer: Self-pay

## 2015-11-11 ENCOUNTER — Ambulatory Visit: Payer: Self-pay

## 2015-11-14 MED FILL — PHENTERMINE 37.5 MG TABLET: 37.5 | 30 days supply | Qty: 30 | Fill #1

## 2015-11-17 DIAGNOSIS — R635 Abnormal weight gain: Secondary | ICD-10-CM | POA: Diagnosis not present

## 2015-11-17 DIAGNOSIS — R7303 Prediabetes: Secondary | ICD-10-CM | POA: Diagnosis not present

## 2015-11-17 DIAGNOSIS — E785 Hyperlipidemia, unspecified: Secondary | ICD-10-CM | POA: Diagnosis not present

## 2015-11-17 DIAGNOSIS — R739 Hyperglycemia, unspecified: Secondary | ICD-10-CM | POA: Diagnosis not present

## 2015-11-17 DIAGNOSIS — D649 Anemia, unspecified: Secondary | ICD-10-CM | POA: Diagnosis not present

## 2015-11-17 DIAGNOSIS — E559 Vitamin D deficiency, unspecified: Secondary | ICD-10-CM | POA: Diagnosis not present

## 2015-11-17 DIAGNOSIS — E669 Obesity, unspecified: Secondary | ICD-10-CM | POA: Diagnosis not present

## 2015-12-20 ENCOUNTER — Ambulatory Visit: Payer: Self-pay | Admitting: Hematology

## 2015-12-20 ENCOUNTER — Other Ambulatory Visit: Payer: Self-pay

## 2016-01-04 MED FILL — CLINDAMYCIN HCL 150 MG CAPS: 150 | 10 days supply | Qty: 30 | Fill #0

## 2016-01-04 MED FILL — metroNIDAZOLE 500 MG TABS: 500 | 10 days supply | Qty: 30 | Fill #0

## 2016-01-11 MED FILL — PHENTERMINE 37.5 MG TABLET: 37.5 | 30 days supply | Qty: 30 | Fill #2

## 2016-03-02 DIAGNOSIS — R635 Abnormal weight gain: Secondary | ICD-10-CM | POA: Diagnosis not present

## 2016-03-02 DIAGNOSIS — E559 Vitamin D deficiency, unspecified: Secondary | ICD-10-CM | POA: Diagnosis not present

## 2016-03-02 DIAGNOSIS — D649 Anemia, unspecified: Secondary | ICD-10-CM | POA: Diagnosis not present

## 2016-03-02 DIAGNOSIS — E669 Obesity, unspecified: Secondary | ICD-10-CM | POA: Diagnosis not present

## 2016-03-02 DIAGNOSIS — R7303 Prediabetes: Secondary | ICD-10-CM | POA: Diagnosis not present

## 2016-03-02 DIAGNOSIS — E785 Hyperlipidemia, unspecified: Secondary | ICD-10-CM | POA: Diagnosis not present

## 2016-03-02 DIAGNOSIS — R739 Hyperglycemia, unspecified: Secondary | ICD-10-CM | POA: Diagnosis not present

## 2016-03-05 MED FILL — PHENTERMINE 37.5 MG TABLET: 37.5 | 30 days supply | Qty: 30 | Fill #0

## 2016-03-21 DIAGNOSIS — Z6830 Body mass index (BMI) 30.0-30.9, adult: Secondary | ICD-10-CM | POA: Diagnosis not present

## 2016-03-21 DIAGNOSIS — Z1389 Encounter for screening for other disorder: Secondary | ICD-10-CM | POA: Diagnosis not present

## 2016-03-21 DIAGNOSIS — Z01419 Encounter for gynecological examination (general) (routine) without abnormal findings: Secondary | ICD-10-CM | POA: Diagnosis not present

## 2016-03-21 DIAGNOSIS — Z130 Encounter for screening for diseases of the blood and blood-forming organs and certain disorders involving the immune mechanism: Secondary | ICD-10-CM | POA: Diagnosis not present

## 2016-03-21 DIAGNOSIS — Z113 Encounter for screening for infections with a predominantly sexual mode of transmission: Secondary | ICD-10-CM | POA: Diagnosis not present

## 2016-04-17 ENCOUNTER — Ambulatory Visit: Payer: Self-pay

## 2016-04-17 ENCOUNTER — Other Ambulatory Visit: Payer: Self-pay | Admitting: Occupational Medicine

## 2016-04-17 DIAGNOSIS — M79645 Pain in left finger(s): Secondary | ICD-10-CM

## 2016-05-07 MED FILL — PHENTERMINE 37.5 MG TABLET: 37.5 | 30 days supply | Qty: 30 | Fill #1

## 2016-05-29 DIAGNOSIS — D649 Anemia, unspecified: Secondary | ICD-10-CM | POA: Diagnosis not present

## 2016-05-29 DIAGNOSIS — E785 Hyperlipidemia, unspecified: Secondary | ICD-10-CM | POA: Diagnosis not present

## 2016-05-29 DIAGNOSIS — R739 Hyperglycemia, unspecified: Secondary | ICD-10-CM | POA: Diagnosis not present

## 2016-05-29 DIAGNOSIS — R7303 Prediabetes: Secondary | ICD-10-CM | POA: Diagnosis not present

## 2016-05-29 DIAGNOSIS — R632 Polyphagia: Secondary | ICD-10-CM | POA: Diagnosis not present

## 2016-05-29 DIAGNOSIS — E669 Obesity, unspecified: Secondary | ICD-10-CM | POA: Diagnosis not present

## 2016-05-29 DIAGNOSIS — E559 Vitamin D deficiency, unspecified: Secondary | ICD-10-CM | POA: Diagnosis not present

## 2016-05-29 DIAGNOSIS — R635 Abnormal weight gain: Secondary | ICD-10-CM | POA: Diagnosis not present

## 2016-05-30 MED FILL — VYVANSE 10 MG CAPSULE: 10 | 30 days supply | Qty: 30 | Fill #0

## 2016-06-27 DIAGNOSIS — R0981 Nasal congestion: Secondary | ICD-10-CM | POA: Diagnosis not present

## 2016-06-27 DIAGNOSIS — R635 Abnormal weight gain: Secondary | ICD-10-CM | POA: Diagnosis not present

## 2016-06-27 DIAGNOSIS — R632 Polyphagia: Secondary | ICD-10-CM | POA: Diagnosis not present

## 2016-06-27 DIAGNOSIS — E669 Obesity, unspecified: Secondary | ICD-10-CM | POA: Diagnosis not present

## 2016-06-27 DIAGNOSIS — D649 Anemia, unspecified: Secondary | ICD-10-CM | POA: Diagnosis not present

## 2016-06-27 DIAGNOSIS — R739 Hyperglycemia, unspecified: Secondary | ICD-10-CM | POA: Diagnosis not present

## 2016-06-27 DIAGNOSIS — E785 Hyperlipidemia, unspecified: Secondary | ICD-10-CM | POA: Diagnosis not present

## 2016-06-27 DIAGNOSIS — R7303 Prediabetes: Secondary | ICD-10-CM | POA: Diagnosis not present

## 2016-06-27 DIAGNOSIS — E559 Vitamin D deficiency, unspecified: Secondary | ICD-10-CM | POA: Diagnosis not present

## 2016-07-09 MED FILL — VYVANSE 10 MG CAPSULE: 10 | 30 days supply | Qty: 30 | Fill #0

## 2016-07-25 DIAGNOSIS — R0981 Nasal congestion: Secondary | ICD-10-CM | POA: Diagnosis not present

## 2016-07-25 DIAGNOSIS — N760 Acute vaginitis: Secondary | ICD-10-CM | POA: Diagnosis not present

## 2016-07-25 DIAGNOSIS — Z113 Encounter for screening for infections with a predominantly sexual mode of transmission: Secondary | ICD-10-CM | POA: Diagnosis not present

## 2016-07-25 DIAGNOSIS — E669 Obesity, unspecified: Secondary | ICD-10-CM | POA: Diagnosis not present

## 2016-07-25 DIAGNOSIS — R739 Hyperglycemia, unspecified: Secondary | ICD-10-CM | POA: Diagnosis not present

## 2016-07-25 DIAGNOSIS — Z124 Encounter for screening for malignant neoplasm of cervix: Secondary | ICD-10-CM | POA: Diagnosis not present

## 2016-07-25 DIAGNOSIS — R7303 Prediabetes: Secondary | ICD-10-CM | POA: Diagnosis not present

## 2016-07-25 DIAGNOSIS — R635 Abnormal weight gain: Secondary | ICD-10-CM | POA: Diagnosis not present

## 2016-07-25 DIAGNOSIS — D649 Anemia, unspecified: Secondary | ICD-10-CM | POA: Diagnosis not present

## 2016-07-25 DIAGNOSIS — R632 Polyphagia: Secondary | ICD-10-CM | POA: Diagnosis not present

## 2016-07-25 DIAGNOSIS — E785 Hyperlipidemia, unspecified: Secondary | ICD-10-CM | POA: Diagnosis not present

## 2016-07-25 DIAGNOSIS — E559 Vitamin D deficiency, unspecified: Secondary | ICD-10-CM | POA: Diagnosis not present

## 2016-07-25 MED FILL — VANDAZOLE VAGINAL 0.75% GEL: 0.75 | 5 days supply | Qty: 70 | Fill #0

## 2016-11-08 DIAGNOSIS — D649 Anemia, unspecified: Secondary | ICD-10-CM | POA: Diagnosis not present

## 2016-11-08 DIAGNOSIS — H539 Unspecified visual disturbance: Secondary | ICD-10-CM | POA: Diagnosis not present

## 2016-11-08 DIAGNOSIS — Z131 Encounter for screening for diabetes mellitus: Secondary | ICD-10-CM | POA: Diagnosis not present

## 2016-11-08 DIAGNOSIS — E785 Hyperlipidemia, unspecified: Secondary | ICD-10-CM | POA: Diagnosis not present

## 2016-11-08 DIAGNOSIS — Z0000 Encounter for general adult medical examination without abnormal findings: Secondary | ICD-10-CM | POA: Diagnosis not present

## 2016-11-08 DIAGNOSIS — Z0110 Encounter for examination of ears and hearing without abnormal findings: Secondary | ICD-10-CM | POA: Diagnosis not present

## 2016-11-08 DIAGNOSIS — R635 Abnormal weight gain: Secondary | ICD-10-CM | POA: Diagnosis not present

## 2016-11-08 DIAGNOSIS — R632 Polyphagia: Secondary | ICD-10-CM | POA: Diagnosis not present

## 2016-11-08 DIAGNOSIS — E559 Vitamin D deficiency, unspecified: Secondary | ICD-10-CM | POA: Diagnosis not present

## 2016-11-08 DIAGNOSIS — E669 Obesity, unspecified: Secondary | ICD-10-CM | POA: Diagnosis not present

## 2016-11-08 DIAGNOSIS — Z01419 Encounter for gynecological examination (general) (routine) without abnormal findings: Secondary | ICD-10-CM | POA: Diagnosis not present

## 2016-12-03 DIAGNOSIS — E669 Obesity, unspecified: Secondary | ICD-10-CM | POA: Diagnosis not present

## 2016-12-03 DIAGNOSIS — E785 Hyperlipidemia, unspecified: Secondary | ICD-10-CM | POA: Diagnosis not present

## 2016-12-03 DIAGNOSIS — E559 Vitamin D deficiency, unspecified: Secondary | ICD-10-CM | POA: Diagnosis not present

## 2016-12-03 DIAGNOSIS — R635 Abnormal weight gain: Secondary | ICD-10-CM | POA: Diagnosis not present

## 2016-12-03 DIAGNOSIS — F988 Other specified behavioral and emotional disorders with onset usually occurring in childhood and adolescence: Secondary | ICD-10-CM | POA: Diagnosis not present

## 2016-12-03 DIAGNOSIS — D649 Anemia, unspecified: Secondary | ICD-10-CM | POA: Diagnosis not present

## 2016-12-04 MED FILL — AMPHETAMINE SALTS 10 MG TAB: 10 | 30 days supply | Qty: 60 | Fill #0

## 2017-01-01 DIAGNOSIS — R635 Abnormal weight gain: Secondary | ICD-10-CM | POA: Diagnosis not present

## 2017-01-01 DIAGNOSIS — D649 Anemia, unspecified: Secondary | ICD-10-CM | POA: Diagnosis not present

## 2017-01-01 DIAGNOSIS — E559 Vitamin D deficiency, unspecified: Secondary | ICD-10-CM | POA: Diagnosis not present

## 2017-01-01 DIAGNOSIS — E669 Obesity, unspecified: Secondary | ICD-10-CM | POA: Diagnosis not present

## 2017-01-01 DIAGNOSIS — F988 Other specified behavioral and emotional disorders with onset usually occurring in childhood and adolescence: Secondary | ICD-10-CM | POA: Diagnosis not present

## 2017-01-01 DIAGNOSIS — E785 Hyperlipidemia, unspecified: Secondary | ICD-10-CM | POA: Diagnosis not present

## 2017-01-15 MED FILL — DEXTROAMP-AMPHETAMIN 20 MG: 20 | 30 days supply | Qty: 60 | Fill #0

## 2017-03-04 DIAGNOSIS — E785 Hyperlipidemia, unspecified: Secondary | ICD-10-CM | POA: Diagnosis not present

## 2017-03-04 DIAGNOSIS — R635 Abnormal weight gain: Secondary | ICD-10-CM | POA: Diagnosis not present

## 2017-03-04 DIAGNOSIS — E669 Obesity, unspecified: Secondary | ICD-10-CM | POA: Diagnosis not present

## 2017-03-04 DIAGNOSIS — F988 Other specified behavioral and emotional disorders with onset usually occurring in childhood and adolescence: Secondary | ICD-10-CM | POA: Diagnosis not present

## 2017-03-04 DIAGNOSIS — D649 Anemia, unspecified: Secondary | ICD-10-CM | POA: Diagnosis not present

## 2017-03-04 DIAGNOSIS — E559 Vitamin D deficiency, unspecified: Secondary | ICD-10-CM | POA: Diagnosis not present

## 2017-03-05 MED FILL — DEXTROAMP-AMPHETAMIN 20 MG: 20 | 30 days supply | Qty: 60 | Fill #0

## 2017-03-20 DIAGNOSIS — R635 Abnormal weight gain: Secondary | ICD-10-CM | POA: Diagnosis not present

## 2017-03-20 DIAGNOSIS — D649 Anemia, unspecified: Secondary | ICD-10-CM | POA: Diagnosis not present

## 2017-03-20 DIAGNOSIS — F988 Other specified behavioral and emotional disorders with onset usually occurring in childhood and adolescence: Secondary | ICD-10-CM | POA: Diagnosis not present

## 2017-03-20 DIAGNOSIS — E669 Obesity, unspecified: Secondary | ICD-10-CM | POA: Diagnosis not present

## 2017-03-20 DIAGNOSIS — Z298 Encounter for other specified prophylactic measures: Secondary | ICD-10-CM | POA: Diagnosis not present

## 2017-03-20 DIAGNOSIS — E785 Hyperlipidemia, unspecified: Secondary | ICD-10-CM | POA: Diagnosis not present

## 2017-03-20 DIAGNOSIS — E559 Vitamin D deficiency, unspecified: Secondary | ICD-10-CM | POA: Diagnosis not present

## 2017-04-11 MED FILL — DEXTROAMP-AMPHETAMIN 20 MG: 20 | 30 days supply | Qty: 60 | Fill #0

## 2017-04-29 MED FILL — ATOVAQUONE-PROGUANIL 250-10: 250-100 | 36 days supply | Qty: 36 | Fill #0

## 2017-04-29 MED FILL — AZITHROMYCIN 500 MG TABLET: 500 | 3 days supply | Qty: 3 | Fill #0

## 2017-05-09 MED FILL — DEXTROAMP-AMPHETAMIN 20 MG: 20 | 30 days supply | Qty: 60 | Fill #0

## 2017-07-03 DIAGNOSIS — E559 Vitamin D deficiency, unspecified: Secondary | ICD-10-CM | POA: Diagnosis not present

## 2017-07-03 DIAGNOSIS — F988 Other specified behavioral and emotional disorders with onset usually occurring in childhood and adolescence: Secondary | ICD-10-CM | POA: Diagnosis not present

## 2017-07-03 DIAGNOSIS — D649 Anemia, unspecified: Secondary | ICD-10-CM | POA: Diagnosis not present

## 2017-07-03 DIAGNOSIS — E785 Hyperlipidemia, unspecified: Secondary | ICD-10-CM | POA: Diagnosis not present

## 2017-07-03 DIAGNOSIS — D72819 Decreased white blood cell count, unspecified: Secondary | ICD-10-CM | POA: Diagnosis not present

## 2017-07-03 DIAGNOSIS — E669 Obesity, unspecified: Secondary | ICD-10-CM | POA: Diagnosis not present

## 2017-07-03 DIAGNOSIS — R635 Abnormal weight gain: Secondary | ICD-10-CM | POA: Diagnosis not present

## 2017-10-09 DIAGNOSIS — E669 Obesity, unspecified: Secondary | ICD-10-CM | POA: Diagnosis not present

## 2017-10-09 DIAGNOSIS — R635 Abnormal weight gain: Secondary | ICD-10-CM | POA: Diagnosis not present

## 2017-10-09 DIAGNOSIS — E559 Vitamin D deficiency, unspecified: Secondary | ICD-10-CM | POA: Diagnosis not present

## 2017-10-09 DIAGNOSIS — F988 Other specified behavioral and emotional disorders with onset usually occurring in childhood and adolescence: Secondary | ICD-10-CM | POA: Diagnosis not present

## 2017-10-09 DIAGNOSIS — D72819 Decreased white blood cell count, unspecified: Secondary | ICD-10-CM | POA: Diagnosis not present

## 2017-10-09 DIAGNOSIS — Z0110 Encounter for examination of ears and hearing without abnormal findings: Secondary | ICD-10-CM | POA: Diagnosis not present

## 2017-10-09 DIAGNOSIS — Z0100 Encounter for examination of eyes and vision without abnormal findings: Secondary | ICD-10-CM | POA: Diagnosis not present

## 2017-10-09 DIAGNOSIS — E785 Hyperlipidemia, unspecified: Secondary | ICD-10-CM | POA: Diagnosis not present

## 2017-10-09 DIAGNOSIS — Z0000 Encounter for general adult medical examination without abnormal findings: Secondary | ICD-10-CM | POA: Diagnosis not present

## 2017-10-09 DIAGNOSIS — Z136 Encounter for screening for cardiovascular disorders: Secondary | ICD-10-CM | POA: Diagnosis not present

## 2017-10-09 DIAGNOSIS — D649 Anemia, unspecified: Secondary | ICD-10-CM | POA: Diagnosis not present

## 2018-02-19 DIAGNOSIS — E785 Hyperlipidemia, unspecified: Secondary | ICD-10-CM | POA: Diagnosis not present

## 2018-02-19 DIAGNOSIS — R635 Abnormal weight gain: Secondary | ICD-10-CM | POA: Diagnosis not present

## 2018-02-19 DIAGNOSIS — E669 Obesity, unspecified: Secondary | ICD-10-CM | POA: Diagnosis not present

## 2018-02-19 DIAGNOSIS — D649 Anemia, unspecified: Secondary | ICD-10-CM | POA: Diagnosis not present

## 2018-02-19 DIAGNOSIS — D72819 Decreased white blood cell count, unspecified: Secondary | ICD-10-CM | POA: Diagnosis not present

## 2018-02-19 DIAGNOSIS — F988 Other specified behavioral and emotional disorders with onset usually occurring in childhood and adolescence: Secondary | ICD-10-CM | POA: Diagnosis not present

## 2018-02-19 DIAGNOSIS — E559 Vitamin D deficiency, unspecified: Secondary | ICD-10-CM | POA: Diagnosis not present

## 2018-03-11 ENCOUNTER — Telehealth: Payer: Self-pay | Admitting: Hematology

## 2018-03-11 NOTE — Telephone Encounter (Signed)
LVm for pt regarding upcoming appts per 9/13 sch message

## 2018-03-13 MED FILL — DEXTROAMP-AMPHETAMIN 20 MG: 20 | 30 days supply | Qty: 60 | Fill #0

## 2018-03-24 ENCOUNTER — Inpatient Hospital Stay: Payer: 59 | Attending: Hematology | Admitting: Hematology

## 2018-04-16 MED FILL — DEXTROAMP-AMPHETAMIN 20 MG: 20 | 30 days supply | Qty: 60 | Fill #0

## 2018-05-21 DIAGNOSIS — E785 Hyperlipidemia, unspecified: Secondary | ICD-10-CM | POA: Diagnosis not present

## 2018-05-21 DIAGNOSIS — E669 Obesity, unspecified: Secondary | ICD-10-CM | POA: Diagnosis not present

## 2018-05-21 DIAGNOSIS — D649 Anemia, unspecified: Secondary | ICD-10-CM | POA: Diagnosis not present

## 2018-05-21 DIAGNOSIS — R635 Abnormal weight gain: Secondary | ICD-10-CM | POA: Diagnosis not present

## 2018-05-21 DIAGNOSIS — E559 Vitamin D deficiency, unspecified: Secondary | ICD-10-CM | POA: Diagnosis not present

## 2018-05-21 DIAGNOSIS — F988 Other specified behavioral and emotional disorders with onset usually occurring in childhood and adolescence: Secondary | ICD-10-CM | POA: Diagnosis not present

## 2018-05-21 DIAGNOSIS — D72819 Decreased white blood cell count, unspecified: Secondary | ICD-10-CM | POA: Diagnosis not present

## 2018-05-21 MED FILL — FUSION PLUS CAPSULE: 30 days supply | Qty: 30 | Fill #0

## 2018-06-10 ENCOUNTER — Telehealth: Payer: Self-pay | Admitting: Hematology & Oncology

## 2018-06-10 NOTE — Telephone Encounter (Signed)
lmom for pt to return call to office re new patient appt. Mailed appt letter 06/24/18 at 1030 am

## 2018-06-23 ENCOUNTER — Telehealth: Payer: Self-pay | Admitting: Hematology & Oncology

## 2018-06-23 NOTE — Telephone Encounter (Signed)
Received "return to sender" letter from post office regarding new patient appt letter that was mailed to patient on Dec 17,. 2019. lmom for pt to return call to office to confirm 06/24/18 appt at 1030 am

## 2018-06-24 ENCOUNTER — Inpatient Hospital Stay: Payer: 59 | Attending: Hematology & Oncology | Admitting: Hematology & Oncology

## 2018-06-24 ENCOUNTER — Inpatient Hospital Stay: Payer: 59

## 2018-06-27 ENCOUNTER — Telehealth: Payer: Self-pay | Admitting: Hematology

## 2018-06-27 NOTE — Telephone Encounter (Signed)
Spoke with patient re 1/28 f/u. Date per patient - off day.

## 2018-07-22 ENCOUNTER — Inpatient Hospital Stay: Payer: 59

## 2018-07-22 ENCOUNTER — Telehealth: Payer: Self-pay

## 2018-07-22 ENCOUNTER — Inpatient Hospital Stay: Payer: 59 | Attending: Hematology & Oncology | Admitting: Hematology

## 2018-07-22 VITALS — BP 120/84 | HR 80 | Temp 98.7°F | Resp 18 | Ht 66.0 in | Wt 190.6 lb

## 2018-07-22 DIAGNOSIS — D509 Iron deficiency anemia, unspecified: Secondary | ICD-10-CM | POA: Diagnosis not present

## 2018-07-22 DIAGNOSIS — D500 Iron deficiency anemia secondary to blood loss (chronic): Secondary | ICD-10-CM

## 2018-07-22 DIAGNOSIS — D72819 Decreased white blood cell count, unspecified: Secondary | ICD-10-CM

## 2018-07-22 DIAGNOSIS — R7989 Other specified abnormal findings of blood chemistry: Secondary | ICD-10-CM | POA: Diagnosis not present

## 2018-07-22 LAB — CMP (CANCER CENTER ONLY)
ALT: 15 U/L (ref 0–44)
AST: 18 U/L (ref 15–41)
Albumin: 4.1 g/dL (ref 3.5–5.0)
Alkaline Phosphatase: 42 U/L (ref 38–126)
Anion gap: 8 (ref 5–15)
BUN: 11 mg/dL (ref 6–20)
CO2: 27 mmol/L (ref 22–32)
Calcium: 9.4 mg/dL (ref 8.9–10.3)
Chloride: 104 mmol/L (ref 98–111)
Creatinine: 0.93 mg/dL (ref 0.44–1.00)
GFR, Est AFR Am: >60 mL/min (ref >60)
GFR, Estimated: >60 mL/min (ref >60)
GLUCOSE: 97 mg/dL (ref 70–99)
Potassium: 3.9 mmol/L (ref 3.5–5.1)
Sodium: 139 mmol/L (ref 135–145)
TOTAL PROTEIN: 7.7 g/dL (ref 6.5–8.1)
Total Bilirubin: 0.2 mg/dL — ABNORMAL LOW (ref 0.3–1.2)

## 2018-07-22 LAB — VITAMIN B12: Vitamin B-12: 931 pg/mL — ABNORMAL HIGH (ref 180–914)

## 2018-07-22 LAB — CBC WITH DIFFERENTIAL/PLATELET
Abs Immature Granulocytes: 0 10*3/uL (ref 0.00–0.07)
Basophils Absolute: 0 10*3/uL (ref 0.0–0.1)
Basophils Relative: 1 %
Eosinophils Absolute: 0.1 10*3/uL (ref 0.0–0.5)
Eosinophils Relative: 2 %
HCT: 31.7 % — ABNORMAL LOW (ref 36.0–46.0)
Hemoglobin: 8.5 g/dL — ABNORMAL LOW (ref 12.0–15.0)
IMMATURE GRANULOCYTES: 0 %
Lymphocytes Relative: 43 %
Lymphs Abs: 2 10*3/uL (ref 0.7–4.0)
MCH: 18.4 pg — ABNORMAL LOW (ref 26.0–34.0)
MCHC: 26.8 g/dL — ABNORMAL LOW (ref 30.0–36.0)
MCV: 68.6 fL — ABNORMAL LOW (ref 80.0–100.0)
Monocytes Absolute: 0.4 10*3/uL (ref 0.1–1.0)
Monocytes Relative: 8 %
NEUTROS ABS: 2.2 10*3/uL (ref 1.7–7.7)
Neutrophils Relative %: 46 %
Platelets: 463 10*3/uL — ABNORMAL HIGH (ref 150–400)
RBC: 4.62 MIL/uL (ref 3.87–5.11)
RDW: 26.7 % — ABNORMAL HIGH (ref 11.5–15.5)
WBC: 4.7 10*3/uL (ref 4.0–10.5)
nRBC: 0 % (ref 0.0–0.2)

## 2018-07-22 NOTE — Progress Notes (Signed)
[name]    HEMATOLOGY/ONCOLOGY CLINIC NOTE  Date of Service: 07/22/2018  PCP: Doristine Section Bonsu MD CHIEF COMPLAINTS/PURPOSE OF CONSULTATION:  Iron deficiency anemia.  HISTORY OF PRESENTING ILLNESS:   Tonya Gilbert is a wonderful 33 y.o. female who has been referred to [place] by Dr Benito Mccreedy MD for evaluation and management of  Iron deficiency anemia.  Patient is from [place] (speaks [place]) she is conversant in [place]. Has a history of obesity, iron deficiency anemia, vitamin D deficiency and dyslipidemia and was noted by her primary care physician on labs on 07/08/2015 to have significant anemia with a hemoglobin of 6.9 MCV 58 RDW of 19.3 and platelet count of 421. At the time her ferritin level was 3 and iron saturation of 3%. Vitamin B12 was 1289 folate 18.6. TSH was within normal limits at 0.65. [name]  She notes that she was started on ferrous sulfate 1 tablet daily that she has been taking in a compliant fashion. Notes some mild pica symptoms with ice craving. Minimal fatigue but does not feel it is limiting her work. Has been trying to work out regularly.   Notes that she was given a referral to gastroenterology for a colonoscopy but that they mentioned that they would like to get stool studies first.  Notes regular menstrual periods lasting 7 days with 1-2 heavy days of blood loss. No change in bowel habits. No overt blood in the stools or black stools. No overt hematuria. No other evidence of overt blood loss.  Notes that she has a fairly balanced diet with no dietary restrictions. No fevers/chills/night sweats/unexpected weight loss.  She is on phentermine for weight loss for her obesity.  No other acute new symptoms.  Interval History:   Tonya Gilbert returns today for management and evaluation of her iron deficiency anemia. The patient's last visit with [place] was on 10/25/15. The pt reports that she is doing well overall.   The pt notes that she has not been having  heavy periods, however she notes that her periods last 7 days and are heavy during the first day. The pt denies taking PO iron replacement until the last couple months when she restarted. The pt denies having any blood transfusions recently.   The pt denies any blood in the stools or black stools, but does note that her stools are more dark. She denies any dietary restrictions. The pt notes that she has not had labs since August 2019, with her PCP, as noted below. The pt notes that during her periods she gets a headache and feels light headed occasionally, denies other light headedness or dizziness. The pt takes 2 advil on the first day of her periods, and denies other regular use of NSAIDs. She is not taking a multivitamin. She is taking Phentermine, not every day, and notes that she has not lost much weight.   The pt denies problems tolerating previous IV iron infusions.   The pt went to [place] 6 [place] recently.   Most recent lab results from 02/19/18 of Anemia Profile B revealed all values WNL except for Iron serum at 13, 3% Iron Saturation, Ferritin of 2, WBC at 2.2k, HGB at 6.9, HCT at 23.7, MCV at 59, MCH at 14.7, MCHC at 24.9, RDW at 21.4, PLT at 540k, ANC at 800  On review of systems, pt reports stable energy levels, occasional headaches with dizziness, long periods, and denies abdominal pains, blood in the stools, black stools, light headedness, fatigue, and any  other symptoms.    MEDICAL HISTORY:   #1 obesity #2 iron deficiency anemia #3 hypercholesterolemia not on medications  #4 obesity - on phentermine for weight loss #5 history of vitamin D deficiency #6 history of pre-diabetes  SURGICAL HISTORY: No previous surgeries  SOCIAL HISTORY: Social History   Socioeconomic History  . Marital status: Single    Spouse name: Not on file  . Number of children: Not on file  . Years of education: Not on file  . Highest education level: Not on file  Occupational History  . Not on  file  Social Needs  . Financial resource strain: Not on file  . Food insecurity:    Worry: Not on file    Inability: Not on file  . Transportation needs:    Medical: Not on file    Non-medical: Not on file  Tobacco Use  . Smoking status: Never Smoker  Substance and Sexual Activity  . Alcohol use: No  . Drug use: No  . Sexual activity: Not on file  Lifestyle  . Physical activity:    Days per week: Not on file    Minutes per session: Not on file  . Stress: Not on file  Relationships  . Social connections:    Talks on phone: Not on file    Gets together: Not on file    Attends religious service: Not on file    Active member of club or organization: Not on file    Attends meetings of clubs or organizations: Not on file    Relationship status: Not on file  . Intimate partner violence:    Fear of current or ex partner: Not on file    Emotionally abused: Not on file    Physically abused: Not on file    Forced sexual activity: Not on file  Other Topics Concern  . Not on file  Social History Narrative  . Not on file    FAMILY HISTORY: No family history of blood disorders or cancer that she is aware of.  ALLERGIES:  is allergic to bee venom.  MEDICATIONS:  Current Outpatient Medications  Medication Sig Dispense Refill  . ferrous sulfate 325 (65 FE) MG tablet Take 1 tablet (325 mg total) by mouth 2 (two) times daily with a meal. 180 tablet 3  . Multiple Vitamins-Minerals (MULTIVITAMIN) tablet Take 1 tablet by mouth daily.    . phentermine (ADIPEX-P) 37.5 MG tablet   2   No current facility-administered medications for this visit.     REVIEW OF SYSTEMS:    A 10+ POINT REVIEW OF SYSTEMS WAS OBTAINED including neurology, dermatology, psychiatry, cardiac, respiratory, lymph, extremities, GI, GU, Musculoskeletal, constitutional, breasts, reproductive, HEENT.  All pertinent positives are noted in the HPI.  All others are negative.   PHYSICAL EXAMINATION: ECOG PERFORMANCE  STATUS: 1 - Symptomatic but completely ambulatory  . Vitals:   07/22/18 1419  BP: 120/84  Pulse: 80  Resp: 18  Temp: 98.7 F (37.1 C)  SpO2: 100%   Filed Weights   07/22/18 1419  Weight: 190 lb 9.6 oz (86.5 kg)   .Body mass index is 30.76 kg/m.  GENERAL:alert, in no acute distress and comfortable SKIN: no acute rashes, no significant lesions EYES: conjunctiva are pink and non-injected, sclera anicteric OROPHARYNX: MMM, no exudates, no oropharyngeal erythema or ulceration NECK: supple, no JVD LYMPH:  no palpable lymphadenopathy in the cervical, axillary or inguinal regions LUNGS: clear to auscultation b/l with normal respiratory effort HEART: regular rate &  rhythm ABDOMEN:  normoactive bowel sounds , non tender, not distended. No palpable hepatosplenomegaly.  Extremity: no pedal edema PSYCH: alert & oriented x 3 with fluent speech NEURO: no focal motor/sensory deficits   LABORATORY DATA:  I have reviewed the data as listed  . CBC Latest Ref Rng & Units 10/25/2015 03/11/2013 06/30/2010  WBC 3.9 - 10.3 10e3/uL 6.2 5.7 4.6  Hemoglobin 11.6 - 15.9 g/dL 12.5 8.4(L) 11.2(L)  Hematocrit 34.8 - 46.6 % 38.2 29.0(L) 33.0(L)  Platelets 145 - 400 10e3/uL 290 400 303   . CBC    Component Value Date/Time   WBC 6.2 10/25/2015 1608   WBC 5.7 03/11/2013 1723   RBC 4.70 10/25/2015 1608   RBC 4.58 03/11/2013 1723   HGB 12.5 10/25/2015 1608   HCT 38.2 10/25/2015 1608   PLT 290 10/25/2015 1608   MCV 81.3 10/25/2015 1608   MCH 26.6 10/25/2015 1608   MCH 18.3 (L) 03/11/2013 1723   MCHC 32.7 10/25/2015 1608   MCHC 29.0 (L) 03/11/2013 1723   RDW 13.9 10/25/2015 1608   LYMPHSABS 1.8 10/25/2015 1608   MONOABS 0.5 10/25/2015 1608   EOSABS 0.1 10/25/2015 1608   BASOSABS 0.0 10/25/2015 1608     . CMP Latest Ref Rng & Units 10/25/2015 03/11/2013 06/30/2010  Glucose 70 - 140 mg/dl 94 114(H) 121(H)  BUN 7.0 - 26.0 mg/dL 14.8 11 12   Creatinine 0.6 - 1.1 mg/dL 1.2(H) 0.82 1.07  Sodium 136 -  145 mEq/L 138 133(L) 139  Potassium 3.5 - 5.1 mEq/L 3.9 3.7 3.9  Chloride 96 - 112 mEq/L - 98 104  CO2 22 - 29 mEq/L 27 25 24   Calcium 8.4 - 10.4 mg/dL 9.5 9.4 8.9  Total Protein 6.4 - 8.3 g/dL 7.7 8.0 6.9  Total Bilirubin 0.20 - 1.20 mg/dL <0.30 0.1(L) 0.3  Alkaline Phos 40 - 150 U/L 51 45 30(L)  AST 5 - 34 U/L 16 24 15   ALT 0 - 55 U/L 15 15 15    02/20/18 Anemia Profile B:    RADIOGRAPHIC STUDIES: I have personally reviewed the radiological images as listed and agreed with the findings in the report. No results found.  ASSESSMENT & PLAN:   33 y.o. female from [place] with  #1 Iron deficiency anemia Patient had a hemoglobin of 6.9 with an MCV of 58, ferritin level of 3 with 3% iron saturation in January 2017. Had some mild reactive thrombocytosis at the time and normal WBC. She has been on iron replacement with oral ferrous sulfate 1 tablet by mouth daily. Labs today show that her hemoglobin has normalized at 12.5 and the MCV has normalized to 81.3  #2 reactive thrombocytosis -now normalized after iron replacement .  PLAN: -Discussed pt labwork from 02/19/18; Ferritin at 2, HGB at 6.9, 3% Iron saturation  -Pt prefers not to have a blood transfusion at this time, but will let me know if she becomes more light headed or dizzy -Will repeat blood tests today -Will set pt up for IV Injectafer weekly x2 doses -Begin taking multivitamin every day for at least the next 3 months -Patient's labs previously significantly improved with oral iron replacement .which suggests no issues with oral iron absorption.  -Continue PO Ferrous sulfate 1 tablet by mouth BID with meals and preferably with orange juice to her ferritin levels are close to 100 to ensure adequate stores . -Patient has been evaluated by gastroenterology -The fact that her hemoglobin and microcytosis completely corrected with iron replacement rules out  the possibility of an underlying hemoglobinopathy. -Will see the pt  back 2 months    Labs today Please schedule for IV Injectafer weekly x 2 doses ASAP RTC with Dr Irene Limbo in 2 months with labs   All of the patients questions were answered with apparent satisfaction. The patient knows to call the clinic with any problems, questions or concerns.  The total time spent in the appt was 25 minutes and more than 50% was on counseling and direct patient cares.    Sullivan Lone MD [place] AAHIVMS [facility] Hematology/Oncology Physician [facility]  (Office):       [phone] (Work cell):  [phone] (Fax):           [phone]  I, Baldwin [place], am acting as a scribe for Dr. Sullivan Lone.   .I have reviewed the above documentation for accuracy and completeness, and I agree with the above. Brunetta Genera MD   ADDENDUM  Labs done today reviewed  . CBC    Component Value Date/Time   WBC 4.7 07/22/2018 1523   RBC 4.62 07/22/2018 1523   HGB 8.5 (L) 07/22/2018 1523   HGB 12.5 10/25/2015 1608   HCT 31.7 (L) 07/22/2018 1523   HCT 38.2 10/25/2015 1608   PLT 463 (H) 07/22/2018 1523   PLT 290 10/25/2015 1608   MCV 68.6 (L) 07/22/2018 1523   MCV 81.3 10/25/2015 1608   MCH 18.4 (L) 07/22/2018 1523   MCHC 26.8 (L) 07/22/2018 1523   RDW 26.7 (H) 07/22/2018 1523   RDW 13.9 10/25/2015 1608   LYMPHSABS 2.0 07/22/2018 1523   LYMPHSABS 1.8 10/25/2015 1608   MONOABS 0.4 07/22/2018 1523   MONOABS 0.5 10/25/2015 1608   EOSABS 0.1 07/22/2018 1523   EOSABS 0.1 10/25/2015 1608   BASOSABS 0.0 07/22/2018 1523   BASOSABS 0.0 10/25/2015 1608    Lab Results  Component Value Date   IRON 31 (L) 07/22/2018   TIBC 444 07/22/2018   IRONPCTSAT 7 (L) 07/22/2018   (Iron and TIBC)  Lab Results  Component Value Date   FERRITIN 4 (L) 07/22/2018     Component     Latest Ref Rng & Units 07/22/2018  Intrinsic Factor     0.0 - 1.1 AU/mL 1.0  Parietal Cell Antibody-IgG     0.0 - 20.0 Units 4.5  Vitamin B12     180 - 914 pg/mL 931 (H)

## 2018-07-22 NOTE — Telephone Encounter (Signed)
Printed avs and calender of upcoming appointment. Per 12/18 los 

## 2018-07-23 LAB — ANTI-PARIETAL ANTIBODY: PARIETAL CELL ANTIBODY-IGG: 4.5 U (ref 0.0–20.0)

## 2018-07-23 LAB — IRON AND TIBC
Iron: 31 ug/dL — ABNORMAL LOW (ref 41–142)
Saturation Ratios: 7 % — ABNORMAL LOW (ref 21–57)
TIBC: 444 ug/dL (ref 236–444)
UIBC: 413 ug/dL — ABNORMAL HIGH (ref 120–384)

## 2018-07-23 LAB — INTRINSIC FACTOR ANTIBODIES: INTRINSIC FACTOR: 1 [AU]/ml (ref 0.0–1.1)

## 2018-07-23 LAB — FERRITIN: Ferritin: 4 ng/mL — ABNORMAL LOW (ref 11–307)

## 2018-07-30 ENCOUNTER — Inpatient Hospital Stay: Payer: 59 | Attending: Hematology & Oncology

## 2018-07-30 VITALS — BP 146/97 | HR 81 | Temp 98.4°F | Resp 17

## 2018-07-30 DIAGNOSIS — D508 Other iron deficiency anemias: Secondary | ICD-10-CM

## 2018-07-30 DIAGNOSIS — D509 Iron deficiency anemia, unspecified: Secondary | ICD-10-CM | POA: Diagnosis not present

## 2018-07-30 MED ORDER — SODIUM CHLORIDE 0.9 % IV SOLN
750.0000 mg | Freq: Once | INTRAVENOUS | Status: AC
  Administered 2018-07-30: 750 mg via INTRAVENOUS
  Filled 2018-07-30: qty 15

## 2018-07-30 MED ORDER — SODIUM CHLORIDE 0.9 % IV SOLN
Freq: Once | INTRAVENOUS | Status: AC
  Administered 2018-07-30: 16:00:00 via INTRAVENOUS
  Filled 2018-07-30: qty 250

## 2018-07-30 NOTE — Patient Instructions (Signed)

## 2018-08-06 ENCOUNTER — Inpatient Hospital Stay: Payer: 59

## 2018-08-06 VITALS — BP 146/96 | HR 70 | Temp 99.1°F | Resp 16

## 2018-08-06 DIAGNOSIS — D509 Iron deficiency anemia, unspecified: Secondary | ICD-10-CM | POA: Diagnosis not present

## 2018-08-06 DIAGNOSIS — D508 Other iron deficiency anemias: Secondary | ICD-10-CM

## 2018-08-06 MED ORDER — SODIUM CHLORIDE 0.9 % IV SOLN
Freq: Once | INTRAVENOUS | Status: AC
  Administered 2018-08-06: 15:00:00 via INTRAVENOUS
  Filled 2018-08-06: qty 250

## 2018-08-06 MED ORDER — SODIUM CHLORIDE 0.9 % IV SOLN
750.0000 mg | Freq: Once | INTRAVENOUS | Status: AC
  Administered 2018-08-06: 750 mg via INTRAVENOUS
  Filled 2018-08-06: qty 15

## 2018-08-06 NOTE — Patient Instructions (Signed)

## 2018-09-09 MED FILL — FUSION PLUS CAPSULE: 30 days supply | Qty: 30 | Fill #1

## 2018-09-15 ENCOUNTER — Other Ambulatory Visit: Payer: Self-pay | Admitting: *Deleted

## 2018-09-15 DIAGNOSIS — D500 Iron deficiency anemia secondary to blood loss (chronic): Secondary | ICD-10-CM

## 2018-09-15 NOTE — Progress Notes (Signed)
[name]    HEMATOLOGY/ONCOLOGY CLINIC NOTE  Date of Service: 09/16/2018  PCP: Doristine Section Bonsu MD CHIEF COMPLAINTS/PURPOSE OF CONSULTATION:   Iron deficiency anemia.  HISTORY OF PRESENTING ILLNESS:   Tonya Gilbert is a wonderful 33 y.o. female who has been referred to [place] by Dr Benito Mccreedy MD for evaluation and management of  Iron deficiency anemia.  Patient is from [place] (speaks [place]) she is conversant in [place]. Has a history of obesity, iron deficiency anemia, vitamin D deficiency and dyslipidemia and was noted by her primary care physician on labs on 07/08/2015 to have significant anemia with a hemoglobin of 6.9 MCV 58 RDW of 19.3 and platelet count of 421. At the time her ferritin level was 3 and iron saturation of 3%. Vitamin B12 was 1289 folate 18.6. TSH was within normal limits at 0.65. [name]  She notes that she was started on ferrous sulfate 1 tablet daily that she has been taking in a compliant fashion. Notes some mild pica symptoms with ice craving. Minimal fatigue but does not feel it is limiting her work. Has been trying to work out regularly.   Notes that she was given a referral to gastroenterology for a colonoscopy but that they mentioned that they would like to get stool studies first.  Notes regular menstrual periods lasting 7 days with 1-2 heavy days of blood loss. No change in bowel habits. No overt blood in the stools or black stools. No overt hematuria. No other evidence of overt blood loss.  Notes that she has a fairly balanced diet with no dietary restrictions. No fevers/chills/night sweats/unexpected weight loss.  She is on phentermine for weight loss for her obesity.  No other acute new symptoms.  Interval History:   Tonya Gilbert returns today for management and evaluation of her iron deficiency anemia. The patient's last visit with [place] was on 07/22/18. The pt reports that she is doing well overall.   The pt reports that she has been enjoying  good energy levels. She endorses regular periods. She denies developing any new concerns. The pt notes that she has continued taking PO Ferrous Sulfate once a day and denies any problems taking this.  Lab results today (09/16/18) of CBC w/diff is as follows: all values are WNL except for RDW at 19.9. 09/16/18 Ferritin is 96 09/16/18 Iron & TIBC is 38%  On review of systems, pt reports good energy levels, and denies leg swelling, abdominal pains, concerns for infections, and any other symptoms.  MEDICAL HISTORY:   #1 obesity #2 iron deficiency anemia #3 hypercholesterolemia not on medications  #4 obesity - on phentermine for weight loss #5 history of vitamin D deficiency #6 history of pre-diabetes  SURGICAL HISTORY: No previous surgeries  SOCIAL HISTORY: Social History   Socioeconomic History  . Marital status: Single    Spouse name: Not on file  . Number of children: Not on file  . Years of education: Not on file  . Highest education level: Not on file  Occupational History  . Not on file  Social Needs  . Financial resource strain: Not on file  . Food insecurity:    Worry: Not on file    Inability: Not on file  . Transportation needs:    Medical: Not on file    Non-medical: Not on file  Tobacco Use  . Smoking status: Never Smoker  Substance and Sexual Activity  . Alcohol use: No  . Drug use: No  . Sexual activity: Not  on file  Lifestyle  . Physical activity:    Days per week: Not on file    Minutes per session: Not on file  . Stress: Not on file  Relationships  . Social connections:    Talks on phone: Not on file    Gets together: Not on file    Attends religious service: Not on file    Active member of club or organization: Not on file    Attends meetings of clubs or organizations: Not on file    Relationship status: Not on file  . Intimate partner violence:    Fear of current or ex partner: Not on file    Emotionally abused: Not on file    Physically  abused: Not on file    Forced sexual activity: Not on file  Other Topics Concern  . Not on file  Social History Narrative  . Not on file    FAMILY HISTORY: No family history of blood disorders or cancer that she is aware of.  ALLERGIES:  is allergic to bee venom.  MEDICATIONS:  Current Outpatient Medications  Medication Sig Dispense Refill  . ferrous sulfate 325 (65 FE) MG tablet Take 1 tablet (325 mg total) by mouth 2 (two) times daily with a meal. 180 tablet 3  . Multiple Vitamins-Minerals (MULTIVITAMIN) tablet Take 1 tablet by mouth daily.    . phentermine (ADIPEX-P) 37.5 MG tablet   2   No current facility-administered medications for this visit.     REVIEW OF SYSTEMS:    A 10+ POINT REVIEW OF SYSTEMS WAS OBTAINED including neurology, dermatology, psychiatry, cardiac, respiratory, lymph, extremities, GI, GU, Musculoskeletal, constitutional, breasts, reproductive, HEENT.  All pertinent positives are noted in the HPI.  All others are negative.   PHYSICAL EXAMINATION: ECOG PERFORMANCE STATUS: 1 - Symptomatic but completely ambulatory  . Vitals:   09/16/18 1439  BP: 139/89  Pulse: 63  Resp: 18  Temp: 97.8 F (36.6 C)  SpO2: 100%   Filed Weights   09/16/18 1439  Weight: 198 lb 4.8 oz (89.9 kg)   .Body mass index is 32.01 kg/m.  GENERAL:alert, in no acute distress and comfortable SKIN: no acute rashes, no significant lesions EYES: conjunctiva are pink and non-injected, sclera anicteric OROPHARYNX: MMM, no exudates, no oropharyngeal erythema or ulceration NECK: supple, no JVD LYMPH:  no palpable lymphadenopathy in the cervical, axillary or inguinal regions LUNGS: clear to auscultation b/l with normal respiratory effort HEART: regular rate & rhythm ABDOMEN:  normoactive bowel sounds , non tender, not distended. No palpable hepatosplenomegaly.  Extremity: no pedal edema PSYCH: alert & oriented x 3 with fluent speech NEURO: no focal motor/sensory deficits    LABORATORY DATA:  I have reviewed the data as listed  . CBC Latest Ref Rng & Units 09/16/2018 07/22/2018 10/25/2015  WBC 4.0 - 10.5 K/uL 4.0 4.7 6.2  Hemoglobin 12.0 - 15.0 g/dL 12.4 8.5(L) 12.5  Hematocrit 36.0 - 46.0 % 40.4 31.7(L) 38.2  Platelets 150 - 400 K/uL 220 463(H) 290   . CBC    Component Value Date/Time   WBC 4.0 09/16/2018 1424   WBC 4.7 07/22/2018 1523   RBC 4.73 09/16/2018 1424   HGB 12.4 09/16/2018 1424   HGB 12.5 10/25/2015 1608   HCT 40.4 09/16/2018 1424   HCT 38.2 10/25/2015 1608   PLT 220 09/16/2018 1424   PLT 290 10/25/2015 1608   MCV 85.4 09/16/2018 1424   MCV 81.3 10/25/2015 1608   MCH 26.2 09/16/2018 1424  MCHC 30.7 09/16/2018 1424   RDW 19.9 (H) 09/16/2018 1424   RDW 13.9 10/25/2015 1608   LYMPHSABS 1.6 09/16/2018 1424   LYMPHSABS 1.8 10/25/2015 1608   MONOABS 0.3 09/16/2018 1424   MONOABS 0.5 10/25/2015 1608   EOSABS 0.1 09/16/2018 1424   EOSABS 0.1 10/25/2015 1608   BASOSABS 0.0 09/16/2018 1424   BASOSABS 0.0 10/25/2015 1608     . CMP Latest Ref Rng & Units 07/22/2018 10/25/2015 03/11/2013  Glucose 70 - 99 mg/dL 97 94 114(H)  BUN 6 - 20 mg/dL 11 14.8 11  Creatinine 0.44 - 1.00 mg/dL 0.93 1.2(H) 0.82  Sodium 135 - 145 mmol/L 139 138 133(L)  Potassium 3.5 - 5.1 mmol/L 3.9 3.9 3.7  Chloride 98 - 111 mmol/L 104 - 98  CO2 22 - 32 mmol/L 27 27 25   Calcium 8.9 - 10.3 mg/dL 9.4 9.5 9.4  Total Protein 6.5 - 8.1 g/dL 7.7 7.7 8.0  Total Bilirubin 0.3 - 1.2 mg/dL 0.2(L) <0.30 0.1(L)  Alkaline Phos 38 - 126 U/L 42 51 45  AST 15 - 41 U/L 18 16 24   ALT 0 - 44 U/L 15 15 15    . Lab Results  Component Value Date   IRON 99 09/16/2018   TIBC 256 09/16/2018   IRONPCTSAT 38 09/16/2018   (Iron and TIBC)  Lab Results  Component Value Date   FERRITIN 96 09/16/2018    02/20/18 Anemia Profile B:    RADIOGRAPHIC STUDIES: I have personally reviewed the radiological images as listed and agreed with the findings in the report. No results found.   ASSESSMENT & PLAN:   33 y.o. female from [place] with  #1 Iron deficiency anemia Patient had a hemoglobin of 6.9 with an MCV of 58, ferritin level of 3 with 3% iron saturation in January 2017. Had some mild reactive thrombocytosis at the time and normal WBC. She has been on iron replacement with oral ferrous sulfate 1 tablet by mouth daily. Labs today show that her hemoglobin has normalized at 12.5 and the MCV has normalized to 81.3 -The fact that her hemoglobin and microcytosis completely corrected with iron replacement rules out the possibility of an underlying hemoglobinopathy.  #2 reactive thrombocytosis -now normalized after iron replacement .  PLAN: -Discussed pt labwork today, 09/16/18; HGB normalized to 12.4 -09/16/18 Ferritin is adequate at 96 -Continue taking 325mg  Ferrous Sulfate daily -Continue taking multivitamin every day -Recommend repeating Ferritin and CBC with PCP again in 6 months. Pt reports seeing her PCP every 3 months. -Will be happy to see pt back sooner if her labs with PCP reveal worsened anemia or iron deficiency -Patient has been evaluated by gastroenterology -Patient's labs previously significantly improved with oral iron replacement .which suggests no issues with oral iron absorption. -Will see the pt back in one year   RTC with Dr Irene Limbo with labs in 12 months   All of the patients questions were answered with apparent satisfaction. The patient knows to call the clinic with any problems, questions or concerns.  The total time spent in the appt was 25 minutes and more than 50% was on counseling and direct patient cares.    Sullivan Lone MD [place] AAHIVMS [facility] Hematology/Oncology Physician [facility]  (Office):       [phone] (Work cell):  [phone] (Fax):           [phone]  I, Baldwin Jamaica, am acting as a scribe for Dr. Sullivan Lone.   .I have reviewed  the above documentation for accuracy and completeness, and I agree  with the above. Brunetta Genera MD

## 2018-09-16 ENCOUNTER — Inpatient Hospital Stay: Payer: 59 | Attending: Hematology & Oncology | Admitting: Hematology

## 2018-09-16 ENCOUNTER — Telehealth: Payer: Self-pay | Admitting: Hematology

## 2018-09-16 ENCOUNTER — Other Ambulatory Visit: Payer: Self-pay

## 2018-09-16 ENCOUNTER — Inpatient Hospital Stay: Payer: 59

## 2018-09-16 VITALS — BP 139/89 | HR 63 | Temp 97.8°F | Resp 18 | Ht 66.0 in | Wt 198.3 lb

## 2018-09-16 DIAGNOSIS — D509 Iron deficiency anemia, unspecified: Secondary | ICD-10-CM | POA: Insufficient documentation

## 2018-09-16 DIAGNOSIS — E669 Obesity, unspecified: Secondary | ICD-10-CM | POA: Diagnosis not present

## 2018-09-16 DIAGNOSIS — Z79899 Other long term (current) drug therapy: Secondary | ICD-10-CM | POA: Insufficient documentation

## 2018-09-16 DIAGNOSIS — E7800 Pure hypercholesterolemia, unspecified: Secondary | ICD-10-CM | POA: Insufficient documentation

## 2018-09-16 DIAGNOSIS — E559 Vitamin D deficiency, unspecified: Secondary | ICD-10-CM | POA: Diagnosis not present

## 2018-09-16 DIAGNOSIS — E785 Hyperlipidemia, unspecified: Secondary | ICD-10-CM | POA: Diagnosis not present

## 2018-09-16 DIAGNOSIS — R7303 Prediabetes: Secondary | ICD-10-CM | POA: Insufficient documentation

## 2018-09-16 DIAGNOSIS — D500 Iron deficiency anemia secondary to blood loss (chronic): Secondary | ICD-10-CM

## 2018-09-16 LAB — CBC WITH DIFFERENTIAL (CANCER CENTER ONLY)
Abs Immature Granulocytes: 0 10*3/uL (ref 0.00–0.07)
Basophils Absolute: 0 10*3/uL (ref 0.0–0.1)
Basophils Relative: 1 %
Eosinophils Absolute: 0.1 10*3/uL (ref 0.0–0.5)
Eosinophils Relative: 2 %
HCT: 40.4 % (ref 36.0–46.0)
HEMOGLOBIN: 12.4 g/dL (ref 12.0–15.0)
Immature Granulocytes: 0 %
Lymphocytes Relative: 41 %
Lymphs Abs: 1.6 10*3/uL (ref 0.7–4.0)
MCH: 26.2 pg (ref 26.0–34.0)
MCHC: 30.7 g/dL (ref 30.0–36.0)
MCV: 85.4 fL (ref 80.0–100.0)
Monocytes Absolute: 0.3 10*3/uL (ref 0.1–1.0)
Monocytes Relative: 8 %
Neutro Abs: 2 10*3/uL (ref 1.7–7.7)
Neutrophils Relative %: 48 %
Platelet Count: 220 10*3/uL (ref 150–400)
RBC: 4.73 MIL/uL (ref 3.87–5.11)
RDW: 19.9 % — ABNORMAL HIGH (ref 11.5–15.5)
WBC Count: 4 10*3/uL (ref 4.0–10.5)
nRBC: 0 % (ref 0.0–0.2)

## 2018-09-16 LAB — IRON AND TIBC
Iron: 99 ug/dL (ref 41–142)
Saturation Ratios: 38 % (ref 21–57)
TIBC: 256 ug/dL (ref 236–444)
UIBC: 158 ug/dL (ref 120–384)

## 2018-09-16 LAB — FERRITIN: Ferritin: 96 ng/mL (ref 11–307)

## 2018-09-16 NOTE — Telephone Encounter (Signed)
Scheduled appt per 3/24 los.  Printed and mailed calendar.

## 2018-10-10 DIAGNOSIS — Z0000 Encounter for general adult medical examination without abnormal findings: Secondary | ICD-10-CM | POA: Diagnosis not present

## 2018-10-10 DIAGNOSIS — Z131 Encounter for screening for diabetes mellitus: Secondary | ICD-10-CM | POA: Diagnosis not present

## 2018-10-10 DIAGNOSIS — Z0110 Encounter for examination of ears and hearing without abnormal findings: Secondary | ICD-10-CM | POA: Diagnosis not present

## 2018-10-10 DIAGNOSIS — Z0100 Encounter for examination of eyes and vision without abnormal findings: Secondary | ICD-10-CM | POA: Diagnosis not present

## 2018-10-10 DIAGNOSIS — Z113 Encounter for screening for infections with a predominantly sexual mode of transmission: Secondary | ICD-10-CM | POA: Diagnosis not present

## 2018-10-29 MED FILL — FUSION PLUS CAPSULE: 30 days supply | Qty: 30 | Fill #2

## 2018-12-29 MED FILL — FUSION PLUS CAPSULE: 30 days supply | Qty: 30 | Fill #3

## 2019-03-25 MED FILL — FUSION PLUS CAPSULE: 30 days supply | Qty: 30 | Fill #4

## 2019-03-27 NOTE — Progress Notes (Signed)
This encounter was created in error - please disregard.

## 2019-08-19 MED FILL — FUSION PLUS CAPSULE: 30 days supply | Qty: 30 | Fill #0

## 2019-09-16 ENCOUNTER — Inpatient Hospital Stay: Payer: 59

## 2019-09-16 ENCOUNTER — Telehealth: Payer: Self-pay | Admitting: Hematology

## 2019-09-16 ENCOUNTER — Inpatient Hospital Stay: Payer: 59 | Admitting: Hematology

## 2019-09-16 NOTE — Telephone Encounter (Signed)
R/s appt per 3/24 sch message - pt aware of appt date and time

## 2019-10-08 NOTE — Progress Notes (Signed)
[name]    HEMATOLOGY/ONCOLOGY CLINIC NOTE  Date of Service: 10/09/2019  PCP: Doristine Section Bonsu MD  CHIEF COMPLAINTS/PURPOSE OF CONSULTATION:   Iron deficiency anemia.  HISTORY OF PRESENTING ILLNESS:   Tonya Gilbert is a wonderful 34 y.o. female who has been referred to [place] by Dr Benito Mccreedy MD for evaluation and management of  Iron deficiency anemia.  Patient is from [place] (speaks [place]) she is conversant in [place]. Has a history of obesity, iron deficiency anemia, vitamin D deficiency and dyslipidemia and was noted by her primary care physician on labs on 07/08/2015 to have significant anemia with a hemoglobin of 6.9 MCV 58 RDW of 19.3 and platelet count of 421. At the time her ferritin level was 3 and iron saturation of 3%. Vitamin B12 was 1289 folate 18.6. TSH was within normal limits at 0.65. [name]  She notes that she was started on ferrous sulfate 1 tablet daily that she has been taking in a compliant fashion. Notes some mild pica symptoms with ice craving. Minimal fatigue but does not feel it is limiting her work. Has been trying to work out regularly.   Notes that she was given a referral to gastroenterology for a colonoscopy but that they mentioned that they would like to get stool studies first.  Notes regular menstrual periods lasting 7 days with 1-2 heavy days of blood loss. No change in bowel habits. No overt blood in the stools or black stools. No overt hematuria. No other evidence of overt blood loss.  Notes that she has a fairly balanced diet with no dietary restrictions. No fevers/chills/night sweats/unexpected weight loss.  She is on phentermine for weight loss for her obesity.  No other acute new symptoms.  Interval History:   Tonya Gilbert returns today for management and evaluation of her iron deficiency anemia. The patient's last visit with [place] was on 09/16/2018. The pt reports that she is doing well overall.  The pt reports she is good. She has not  been feeling fatigued. Pt ran out of Iron pills last month. She has had normal periods lasting about 7 days where the first 3 days are heavy. Pt has not seen the gastroenterologist recently.  Lab results today (10/09/19) of CBC w/diff and CMP is as follows: all values are WNL except for Hemoglobin at 8.2, HCT at 29.5, MCV at 70.1, MCH at 19.5, MCHC at 27.8, RDW at 16.5  On review of systems, pt reports healthy appetite and denies changes in bowl habits, black/blood in stool, abdominal pain, weight changes, pedal edema, light headedness, dizziness  and any other symptoms.   MEDICAL HISTORY:   #1 obesity #2 iron deficiency anemia #3 hypercholesterolemia not on medications  #4 obesity - on phentermine for weight loss #5 history of vitamin D deficiency #6 history of pre-diabetes  SURGICAL HISTORY: No previous surgeries  SOCIAL HISTORY: Social History   Socioeconomic History  . Marital status: Single    Spouse name: Not on file  . Number of children: Not on file  . Years of education: Not on file  . Highest education level: Not on file  Occupational History  . Not on file  Tobacco Use  . Smoking status: Never Smoker  Substance and Sexual Activity  . Alcohol use: No  . Drug use: No  . Sexual activity: Not on file  Other Topics Concern  . Not on file  Social History Narrative  . Not on file   Social Determinants of Health  Financial Resource Strain:   . Difficulty of Paying Living Expenses:   Food Insecurity:   . Worried About Charity fundraiser in the Last Year:   . Arboriculturist in the Last Year:   Transportation Needs:   . Film/video editor (Medical):   [name] Lack of Transportation (Non-Medical):   Physical Activity:   . Days of Exercise per Week:   . Minutes of Exercise per Session:   Stress:   . Feeling of Stress :   Social Connections:   . Frequency of Communication with Friends and Family:   . Frequency of Social Gatherings with Friends and Family:   .  Attends Religious Services:   . Active Member of Clubs or Organizations:   . Attends Archivist Meetings:   [name] Marital Status:   Intimate Partner Violence:   . Fear of Current or Ex-Partner:   . Emotionally Abused:   [name] Physically Abused:   . Sexually Abused:     FAMILY HISTORY: No family history of blood disorders or cancer that she is aware of.  ALLERGIES:  is allergic to bee venom.  MEDICATIONS:  Current Outpatient Medications  Medication Sig Dispense Refill  . Multiple Vitamins-Minerals (MULTIVITAMIN) tablet Take 1 tablet by mouth daily.    . ferrous sulfate 325 (65 FE) MG tablet Take 1 tablet (325 mg total) by mouth 2 (two) times daily with a meal. (Patient not taking: Reported on 10/09/2019) 180 tablet 3  . phentermine (ADIPEX-P) 37.5 MG tablet   2   No current facility-administered medications for this visit.    REVIEW OF SYSTEMS:   A 10+ POINT REVIEW OF SYSTEMS WAS OBTAINED including neurology, dermatology, psychiatry, cardiac, respiratory, lymph, extremities, GI, GU, Musculoskeletal, constitutional, breasts, reproductive, HEENT.  All pertinent positives are noted in the HPI.  All others are negative.   PHYSICAL EXAMINATION: ECOG PERFORMANCE STATUS: 1 - Symptomatic but completely ambulatory  . Vitals:   10/09/19 0933  BP: 119/81  Pulse: 89  Resp: 17  Temp: 98 F (36.7 C)  SpO2: 100%   Filed Weights   10/09/19 0933  Weight: 224 lb 9.6 oz (101.9 kg)   .Body mass index is 36.25 kg/m.   GENERAL:alert, in no acute distress and comfortable SKIN: no acute rashes, no significant lesions EYES: conjunctiva are pink and non-injected, sclera anicteric OROPHARYNX: MMM, no exudates, no oropharyngeal erythema or ulceration NECK: supple, no JVD LYMPH:  no palpable lymphadenopathy in the cervical, axillary or inguinal regions LUNGS: clear to auscultation b/l with normal respiratory effort HEART: regular rate & rhythm ABDOMEN:  normoactive bowel sounds , non  tender, not distended. Extremity: no pedal edema PSYCH: alert & oriented x 3 with fluent speech NEURO: no focal motor/sensory deficits   LABORATORY DATA:  I have reviewed the data as listed  . CBC Latest Ref Rng & Units 10/09/2019 09/16/2018 07/22/2018  WBC 4.0 - 10.5 K/uL 4.9 4.0 4.7  Hemoglobin 12.0 - 15.0 g/dL 8.2(L) 12.4 8.5(L)  Hematocrit 36.0 - 46.0 % 29.5(L) 40.4 31.7(L)  Platelets 150 - 400 K/uL 371 220 463(H)   . CBC    Component Value Date/Time   WBC 4.9 10/09/2019 0918   RBC 4.21 10/09/2019 0918   HGB 8.2 (L) 10/09/2019 0918   HGB 12.4 09/16/2018 1424   HGB 12.5 10/25/2015 1608   HCT 29.5 (L) 10/09/2019 0918   HCT 38.2 10/25/2015 1608   PLT 371 10/09/2019 0918   PLT 220 09/16/2018 1424   PLT  290 10/25/2015 1608   MCV 70.1 (L) 10/09/2019 0918   MCV 81.3 10/25/2015 1608   MCH 19.5 (L) 10/09/2019 0918   MCHC 27.8 (L) 10/09/2019 0918   RDW 16.5 (H) 10/09/2019 0918   RDW 13.9 10/25/2015 1608   LYMPHSABS 1.6 10/09/2019 0918   LYMPHSABS 1.8 10/25/2015 1608   MONOABS 0.4 10/09/2019 0918   MONOABS 0.5 10/25/2015 1608   EOSABS 0.1 10/09/2019 0918   EOSABS 0.1 10/25/2015 1608   BASOSABS 0.0 10/09/2019 0918   BASOSABS 0.0 10/25/2015 1608     . CMP Latest Ref Rng & Units 07/22/2018 10/25/2015 03/11/2013  Glucose 70 - 99 mg/dL 97 94 114(H)  BUN 6 - 20 mg/dL 11 14.8 11  Creatinine 0.44 - 1.00 mg/dL 0.93 1.2(H) 0.82  Sodium 135 - 145 mmol/L 139 138 133(L)  Potassium 3.5 - 5.1 mmol/L 3.9 3.9 3.7  Chloride 98 - 111 mmol/L 104 - 98  CO2 22 - 32 mmol/L 27 27 25   Calcium 8.9 - 10.3 mg/dL 9.4 9.5 9.4  Total Protein 6.5 - 8.1 g/dL 7.7 7.7 8.0  Total Bilirubin 0.3 - 1.2 mg/dL 0.2(L) <0.30 0.1(L)  Alkaline Phos 38 - 126 U/L 42 51 45  AST 15 - 41 U/L 18 16 24   ALT 0 - 44 U/L 15 15 15    . Lab Results  Component Value Date   IRON 99 09/16/2018   TIBC 256 09/16/2018   IRONPCTSAT 38 09/16/2018   (Iron and TIBC)  Lab Results  Component Value Date   FERRITIN 96  09/16/2018    02/20/18 Anemia Profile B:    RADIOGRAPHIC STUDIES: I have personally reviewed the radiological images as listed and agreed with the findings in the report. No results found.  ASSESSMENT & PLAN:   34 y.o. female from [place] with  #1 Iron deficiency anemia Patient had a hemoglobin of 6.9 with an MCV of 58, ferritin level of 3 with 3% iron saturation in January 2017. Had some mild reactive thrombocytosis at the time and normal WBC. She has been on iron replacement with oral ferrous sulfate 1 tablet by mouth daily. Labs today show that her hemoglobin has normalized at 12.5 and the MCV has normalized to 81.3 -The fact that her hemoglobin and microcytosis completely corrected with iron replacement rules out the possibility of an underlying hemoglobinopathy.  #2 reactive thrombocytosis -now normalized after iron replacement .  PLAN: -Discussed pt labwork today, 10/09/19; of CBC w/diff and CMP is as follows: all values are WNL except for Hemoglobin at 8.2, HCT at 29.5, MCV at 70.1, MCH at 19.5, MCHC at 27.8, RDW at 16.5 -Advised losing lots of blood due to menorrhagia. -Continue taking multivitamin every day -Recommends seeing OB/GYN- will give referral  -Recommends f/u with PCP Dr. Vista Lawman -Recommends giving IV Iron 2 infusions 1 week apart -Will see back in 3 months   FOLLOW UP: IV Injectafer weekly x 2 doses RTC with Dr Irene Limbo with labs in 3 months Referral to Gynecology for menorrhagia  The total time spent in the appt was 20 minutes and more than 50% was on counseling and direct patient cares.  All of the patient's questions were answered with apparent satisfaction. The patient knows to call the clinic with any problems, questions or concerns.    Sullivan Lone MD [place] AAHIVMS [facility] Hematology/Oncology Physician [facility]  (Office):       [phone] (Work cell):  [phone] (Fax):  [phone]  Carron Curie am acting  as a Education administrator for Dr. Sullivan Lone.   .I have reviewed the above documentation for accuracy and completeness, and I agree with the above. Brunetta Genera MD

## 2019-10-09 ENCOUNTER — Inpatient Hospital Stay: Payer: 59 | Attending: Hematology

## 2019-10-09 ENCOUNTER — Other Ambulatory Visit: Payer: Self-pay

## 2019-10-09 ENCOUNTER — Inpatient Hospital Stay: Payer: 59 | Admitting: Hematology

## 2019-10-09 ENCOUNTER — Telehealth: Payer: Self-pay | Admitting: Hematology

## 2019-10-09 VITALS — BP 119/81 | HR 89 | Temp 98.0°F | Resp 17 | Ht 66.0 in | Wt 224.6 lb

## 2019-10-09 DIAGNOSIS — D509 Iron deficiency anemia, unspecified: Secondary | ICD-10-CM

## 2019-10-09 DIAGNOSIS — N920 Excessive and frequent menstruation with regular cycle: Secondary | ICD-10-CM

## 2019-10-09 DIAGNOSIS — R7989 Other specified abnormal findings of blood chemistry: Secondary | ICD-10-CM | POA: Insufficient documentation

## 2019-10-09 DIAGNOSIS — D500 Iron deficiency anemia secondary to blood loss (chronic): Secondary | ICD-10-CM | POA: Diagnosis not present

## 2019-10-09 DIAGNOSIS — E785 Hyperlipidemia, unspecified: Secondary | ICD-10-CM | POA: Insufficient documentation

## 2019-10-09 DIAGNOSIS — E669 Obesity, unspecified: Secondary | ICD-10-CM | POA: Diagnosis not present

## 2019-10-09 LAB — CBC WITH DIFFERENTIAL/PLATELET
Abs Immature Granulocytes: 0.01 10*3/uL (ref 0.00–0.07)
Basophils Absolute: 0 10*3/uL (ref 0.0–0.1)
Basophils Relative: 1 %
Eosinophils Absolute: 0.1 10*3/uL (ref 0.0–0.5)
Eosinophils Relative: 2 %
HCT: 29.5 % — ABNORMAL LOW (ref 36.0–46.0)
Hemoglobin: 8.2 g/dL — ABNORMAL LOW (ref 12.0–15.0)
Immature Granulocytes: 0 %
Lymphocytes Relative: 33 %
Lymphs Abs: 1.6 10*3/uL (ref 0.7–4.0)
MCH: 19.5 pg — ABNORMAL LOW (ref 26.0–34.0)
MCHC: 27.8 g/dL — ABNORMAL LOW (ref 30.0–36.0)
MCV: 70.1 fL — ABNORMAL LOW (ref 80.0–100.0)
Monocytes Absolute: 0.4 10*3/uL (ref 0.1–1.0)
Monocytes Relative: 9 %
Neutro Abs: 2.7 10*3/uL (ref 1.7–7.7)
Neutrophils Relative %: 55 %
Platelets: 371 10*3/uL (ref 150–400)
RBC: 4.21 MIL/uL (ref 3.87–5.11)
RDW: 16.5 % — ABNORMAL HIGH (ref 11.5–15.5)
WBC: 4.9 10*3/uL (ref 4.0–10.5)
nRBC: 0 % (ref 0.0–0.2)

## 2019-10-09 NOTE — Patient Instructions (Signed)
Thank you for choosing [facility] to provide your oncology and hematology care.   Should you have questions after your visit to the [facility] ([facility]), please contact this office at [phone] between 8:30 AM and 4:30 PM.  Voice mails left after 4:00 PM may not be returned until the following business day.  Calls received after 4:30 PM will be answered by an off-site Nurse Triage Line.    Prescription Refills:  Please have your pharmacy contact us directly for most prescription requests.  Contact the office directly for refills of narcotics (pain medications). Allow 48-72 hours for refills.  Appointments: Please contact the [facility] scheduling department [phone] for questions regarding [facility] appointment scheduling.  Contact the schedulers with any scheduling changes so that your appointment can be rescheduled in a timely manner.   Central Scheduling for [place] [phone] - Call to schedule procedures such as PET scans, CT scans, MRI, Ultrasound, etc.  To afford each patient quality time with our providers, please arrive 30 minutes before your scheduled appointment time.  If you arrive late for your appointment, you may be asked to reschedule.  We strive to give you quality time with our providers, and arriving late affects you and other patients whose appointments are after yours. If you are a no show for multiple scheduled visits, you may be dismissed from the clinic at the providers discretion.     Resources: [facility] Social Workers [phone] for additional information on assistance programs or assistance connecting with community support programs   [place] DSS  336-641-3447: Information regarding food stamps, Medicaid, and utility assistance [place] 336-333-6589   [place] [employer]'s shared-ride transportation service for eligible riders who have a disability that prevents them from riding the fixed route bus.   Medicare Rights Center  800-333-4114 Helps people with Medicare understand their rights and benefits, navigate the Medicare system, and secure the quality healthcare they deserve [employer] 800-227-2345 Assists patients locate various types of support and financial assistance Cancer Care: 1-800-813-HOPE (4673) Provides financial assistance, online support groups, medication/co-pay assistance.   Transportation Assistance for appointments at [facility]: Transportation Coordinator [phone]  Again, thank you for choosing [facility] for your care.       

## 2019-10-09 NOTE — Telephone Encounter (Signed)
Scheduled appt per 4/16 los.  Printed calendar  

## 2019-10-16 ENCOUNTER — Other Ambulatory Visit: Payer: Self-pay

## 2019-10-16 ENCOUNTER — Inpatient Hospital Stay: Payer: 59

## 2019-10-16 VITALS — BP 142/95 | HR 93 | Temp 98.5°F | Resp 18

## 2019-10-16 DIAGNOSIS — E785 Hyperlipidemia, unspecified: Secondary | ICD-10-CM | POA: Diagnosis not present

## 2019-10-16 DIAGNOSIS — N920 Excessive and frequent menstruation with regular cycle: Secondary | ICD-10-CM | POA: Diagnosis not present

## 2019-10-16 DIAGNOSIS — D509 Iron deficiency anemia, unspecified: Secondary | ICD-10-CM

## 2019-10-16 DIAGNOSIS — D508 Other iron deficiency anemias: Secondary | ICD-10-CM

## 2019-10-16 DIAGNOSIS — R7989 Other specified abnormal findings of blood chemistry: Secondary | ICD-10-CM | POA: Diagnosis not present

## 2019-10-16 DIAGNOSIS — E669 Obesity, unspecified: Secondary | ICD-10-CM | POA: Diagnosis not present

## 2019-10-16 MED ORDER — SODIUM CHLORIDE 0.9 % IV SOLN
Freq: Once | INTRAVENOUS | Status: AC
  Filled 2019-10-16: qty 250

## 2019-10-16 MED ORDER — ACETAMINOPHEN 325 MG PO TABS
ORAL_TABLET | ORAL | Status: AC
  Filled 2019-10-16: qty 2

## 2019-10-16 MED ORDER — ACETAMINOPHEN 325 MG PO TABS
650.0000 mg | ORAL_TABLET | Freq: Once | ORAL | Status: AC
  Administered 2019-10-16: 650 mg via ORAL

## 2019-10-16 MED ORDER — LORATADINE 10 MG PO TABS
ORAL_TABLET | ORAL | Status: AC
  Filled 2019-10-16: qty 1

## 2019-10-16 MED ORDER — SODIUM CHLORIDE 0.9 % IV SOLN
750.0000 mg | Freq: Once | INTRAVENOUS | Status: AC
  Administered 2019-10-16: 750 mg via INTRAVENOUS
  Filled 2019-10-16: qty 15

## 2019-10-16 MED ORDER — LORATADINE 10 MG PO TABS
10.0000 mg | ORAL_TABLET | Freq: Once | ORAL | Status: AC
  Administered 2019-10-16: 10 mg via ORAL

## 2019-10-16 NOTE — Patient Instructions (Signed)

## 2019-10-23 ENCOUNTER — Inpatient Hospital Stay: Payer: 59

## 2019-10-23 ENCOUNTER — Other Ambulatory Visit: Payer: Self-pay

## 2019-10-23 VITALS — BP 151/96 | HR 88 | Temp 99.1°F | Resp 17

## 2019-10-23 DIAGNOSIS — N920 Excessive and frequent menstruation with regular cycle: Secondary | ICD-10-CM | POA: Diagnosis not present

## 2019-10-23 DIAGNOSIS — D509 Iron deficiency anemia, unspecified: Secondary | ICD-10-CM | POA: Diagnosis not present

## 2019-10-23 DIAGNOSIS — E669 Obesity, unspecified: Secondary | ICD-10-CM | POA: Diagnosis not present

## 2019-10-23 DIAGNOSIS — E785 Hyperlipidemia, unspecified: Secondary | ICD-10-CM | POA: Diagnosis not present

## 2019-10-23 DIAGNOSIS — R7989 Other specified abnormal findings of blood chemistry: Secondary | ICD-10-CM | POA: Diagnosis not present

## 2019-10-23 DIAGNOSIS — D508 Other iron deficiency anemias: Secondary | ICD-10-CM

## 2019-10-23 MED ORDER — SODIUM CHLORIDE 0.9 % IV SOLN
750.0000 mg | Freq: Once | INTRAVENOUS | Status: AC
  Administered 2019-10-23: 750 mg via INTRAVENOUS
  Filled 2019-10-23: qty 15

## 2019-10-23 MED ORDER — ACETAMINOPHEN 325 MG PO TABS
ORAL_TABLET | ORAL | Status: AC
  Filled 2019-10-23: qty 2

## 2019-10-23 MED ORDER — LORATADINE 10 MG PO TABS
ORAL_TABLET | ORAL | Status: AC
  Filled 2019-10-23: qty 1

## 2019-10-23 MED ORDER — ACETAMINOPHEN 325 MG PO TABS
650.0000 mg | ORAL_TABLET | Freq: Once | ORAL | Status: AC
  Administered 2019-10-23: 650 mg via ORAL

## 2019-10-23 MED ORDER — LORATADINE 10 MG PO TABS
10.0000 mg | ORAL_TABLET | Freq: Once | ORAL | Status: AC
  Administered 2019-10-23: 10 mg via ORAL

## 2019-10-23 MED ORDER — SODIUM CHLORIDE 0.9 % IV SOLN
Freq: Once | INTRAVENOUS | Status: AC
  Filled 2019-10-23: qty 250

## 2019-11-20 ENCOUNTER — Encounter: Payer: Self-pay | Admitting: Obstetrics and Gynecology

## 2019-11-20 ENCOUNTER — Ambulatory Visit (INDEPENDENT_AMBULATORY_CARE_PROVIDER_SITE_OTHER): Payer: 59 | Admitting: Obstetrics and Gynecology

## 2019-11-20 ENCOUNTER — Other Ambulatory Visit: Payer: Self-pay

## 2019-11-20 VITALS — BP 114/60 | HR 80 | Temp 96.8°F | Ht 66.0 in | Wt 220.0 lb

## 2019-11-20 DIAGNOSIS — R635 Abnormal weight gain: Secondary | ICD-10-CM | POA: Diagnosis not present

## 2019-11-20 DIAGNOSIS — N946 Dysmenorrhea, unspecified: Secondary | ICD-10-CM

## 2019-11-20 DIAGNOSIS — E785 Hyperlipidemia, unspecified: Secondary | ICD-10-CM | POA: Diagnosis not present

## 2019-11-20 DIAGNOSIS — N920 Excessive and frequent menstruation with regular cycle: Secondary | ICD-10-CM

## 2019-11-20 DIAGNOSIS — Z862 Personal history of diseases of the blood and blood-forming organs and certain disorders involving the immune mechanism: Secondary | ICD-10-CM

## 2019-11-20 DIAGNOSIS — N852 Hypertrophy of uterus: Secondary | ICD-10-CM | POA: Diagnosis not present

## 2019-11-20 DIAGNOSIS — D72819 Decreased white blood cell count, unspecified: Secondary | ICD-10-CM | POA: Diagnosis not present

## 2019-11-20 DIAGNOSIS — E559 Vitamin D deficiency, unspecified: Secondary | ICD-10-CM | POA: Diagnosis not present

## 2019-11-20 DIAGNOSIS — E669 Obesity, unspecified: Secondary | ICD-10-CM | POA: Diagnosis not present

## 2019-11-20 DIAGNOSIS — D649 Anemia, unspecified: Secondary | ICD-10-CM | POA: Diagnosis not present

## 2019-11-20 MED ORDER — NORETHIN ACE-ETH ESTRAD-FE 1-20 MG-MCG PO TABS
1.0000 | ORAL_TABLET | Freq: Every day | ORAL | 0 refills | Status: DC
Start: 2019-11-20 — End: 2020-01-05

## 2019-11-20 MED FILL — BLISOVI FE 1/20 1-20 MG-MCG: 1-20 | 84 days supply | Qty: 84 | Fill #0

## 2019-11-20 NOTE — Progress Notes (Signed)
34 y.o. [place] or African American Hispanic or Latino female here for a consultation from Dr Irene Limbo for menorrhagia leading to anemia.  She has been followed by Dr Irene Limbo in Hematology for severe iron deficiency anemia and has had 2 iron transfusions in April, 2021. Last CBC in 4/20 showed a hgb of 8.2 (prior to transfusions). Hematology note from 10/09/19 reviewed Period Cycle (Days): 27 Period Duration (Days): 7 Period Pattern: Regular Menstrual Flow: Moderate Menstrual Control: Tampon, Thin pad Menstrual Control Change Freq (Hours): 3-4 Dysmenorrhea: (!) Moderate Dysmenorrhea Symptoms: Cramping On her heavy day she could potentially saturate a super tampon in 2 hours. No BTB. Never sexually active.  She was on OCP's for a summer, she gained weight. Lots of irregular bleeding so she stopped it.   Patient's last menstrual period was 10/30/2019.          Sexually active: No.  The current method of family planning is abstinence.    Exercising: No.  The patient does not participate in regular exercise at present. Smoker:  no  Health Maintenance: Pap:  2018 normal  History of abnormal Pap:  no MMG:  NA TDaP:  Not sure  Gardasil: thinks so   reports that she has never smoked. She has never used smokeless tobacco. She reports that she does not drink alcohol or use drugs. 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  Past Medical History:  Diagnosis Date  . Anemia     History reviewed. No pertinent surgical history.  Current Outpatient Medications  Medication Sig Dispense Refill  . ferrous sulfate 325 (65 FE) MG tablet Take 1 tablet (325 mg total) by mouth 2 (two) times daily with a meal. 180 tablet 3  . Multiple Vitamins-Minerals (MULTIVITAMIN) tablet Take 1 tablet by mouth daily.    . phentermine (ADIPEX-P) 37.5 MG tablet   2   No current facility-administered medications for this visit.    Family History  Problem Relation Age of Onset  . Hypertension Mother     Review of  Systems  Genitourinary: Positive for menstrual problem.  All other systems reviewed and are negative.   Exam:   BP 114/60   Pulse 80   Temp (!) 96.8 F (36 C)   Ht 5\' 6"  (1.676 m)   Wt 220 lb (99.8 kg)   LMP 10/30/2019   BMI 35.51 kg/m   Weight change: @WEIGHTCHANGE @ Height:   Height: 5\' 6"  (167.6 cm)  Ht Readings from Last 3 Encounters:  11/20/19 5\' 6"  (1.676 m)  10/09/19 5\' 6"  (1.676 m)  09/16/18 5\' 6"  (1.676 m)    General appearance: alert, cooperative and appears stated age Head: Normocephalic, without obvious abnormality, atraumatic Neck: no adenopathy, supple, symmetrical, trachea midline and thyroid normal to inspection and palpation Lungs: clear to auscultation bilaterally Cardiovascular: regular rate and rhythm Abdomen: soft, non-tender; non distended,  no masses,  no organomegaly Extremities: extremities normal, atraumatic, no cyanosis or edema Skin: Skin color, texture, turgor normal. No rashes or lesions Lymph nodes: Cervical, supraclavicular, and axillary nodes normal. No abnormal inguinal nodes palpated Neurologic: Grossly normal   Pelvic: External genitalia:  no lesions              Urethra:  normal appearing urethra with no masses, tenderness or lesions              Bartholins and Skenes: normal                 Vagina: normal appearing vagina with normal  color and discharge, no lesions              Cervix: no lesions               Bimanual Exam:  Uterus:  anteverted, mildly enlarged, irregular, mobile, not tender              Adnexa: no mass, fullness, tenderness               Rectovaginal: deferred  Gae Dry chaperoned for the exam.  A:  Menorrhagia  Dysmenorrhea  Severe iron def anemia  Suspect fibroid uterus on exam  P:   Discussed options of OCP's and mirena IUD to slow her flow.   She would like to try OCP's. No contraindications, risks reviewed.  Return in 3 months for a pill check  Will return for pelvic ultrasound  If her anemia  doesn't improve with improvement in her cycles then she will definitely need to f/u with GI   CC: Dr Irene Limbo Note sent

## 2019-11-20 NOTE — Patient Instructions (Signed)

## 2019-11-24 ENCOUNTER — Telehealth: Payer: Self-pay | Admitting: Obstetrics and Gynecology

## 2019-11-24 MED FILL — FUSION PLUS CAPSULE: 30 days supply | Qty: 30 | Fill #0

## 2019-11-24 NOTE — Telephone Encounter (Signed)
Call to patient. Per DPR, OK to leave message on voicemail.   Left voicemail requesting a return call to [facility] to review benefits and schedule recommended Pelvic ultrasound with Jill Jertson, MD 

## 2019-11-25 NOTE — Telephone Encounter (Signed)
Spoke with patient regarding benefits for recommended ultrasound. Patient is aware that ultrasound is transvaginal. Patient acknowledges understanding of information presented. Patient is aware of cancellation policy. Patient scheduled appointment for 12/22/2019 at 1230PM with Sumner Boast, MD. Encounter closed.

## 2019-11-25 NOTE — Telephone Encounter (Signed)
Patient returned call

## 2019-12-17 ENCOUNTER — Telehealth: Payer: Self-pay | Admitting: Obstetrics and Gynecology

## 2019-12-17 NOTE — Telephone Encounter (Signed)
Call to patient regarding appointment schedule for 12/22/2019. Left voicemail informing patient that sonographer will not be in the office and needing to reschedule appointment. Requesting a return call to [facility] at [phone].

## 2019-12-22 ENCOUNTER — Other Ambulatory Visit: Payer: 59

## 2019-12-22 ENCOUNTER — Other Ambulatory Visit: Payer: 59 | Admitting: Obstetrics and Gynecology

## 2019-12-22 NOTE — Telephone Encounter (Signed)
Patient reschedule PUS for 01/05/2020 at 2pm. Encounter closed.

## 2020-01-01 ENCOUNTER — Inpatient Hospital Stay: Payer: 59

## 2020-01-01 ENCOUNTER — Inpatient Hospital Stay: Payer: 59 | Admitting: Hematology

## 2020-01-04 ENCOUNTER — Telehealth: Payer: Self-pay | Admitting: Hematology

## 2020-01-04 NOTE — Telephone Encounter (Signed)
Unable to reach pt per 7/9 sch msg to reschedule appt. Mailed letter with new appt date and time

## 2020-01-05 ENCOUNTER — Other Ambulatory Visit: Payer: Self-pay | Admitting: Obstetrics and Gynecology

## 2020-01-05 ENCOUNTER — Ambulatory Visit (INDEPENDENT_AMBULATORY_CARE_PROVIDER_SITE_OTHER): Payer: 59

## 2020-01-05 ENCOUNTER — Other Ambulatory Visit: Payer: Self-pay

## 2020-01-05 ENCOUNTER — Encounter: Payer: Self-pay | Admitting: Obstetrics and Gynecology

## 2020-01-05 ENCOUNTER — Ambulatory Visit (INDEPENDENT_AMBULATORY_CARE_PROVIDER_SITE_OTHER): Payer: 59 | Admitting: Obstetrics and Gynecology

## 2020-01-05 VITALS — BP 118/74 | HR 88 | Ht 66.0 in | Wt 210.0 lb

## 2020-01-05 DIAGNOSIS — N852 Hypertrophy of uterus: Secondary | ICD-10-CM

## 2020-01-05 DIAGNOSIS — N946 Dysmenorrhea, unspecified: Secondary | ICD-10-CM

## 2020-01-05 DIAGNOSIS — N920 Excessive and frequent menstruation with regular cycle: Secondary | ICD-10-CM | POA: Diagnosis not present

## 2020-01-05 DIAGNOSIS — D259 Leiomyoma of uterus, unspecified: Secondary | ICD-10-CM

## 2020-01-05 DIAGNOSIS — Z862 Personal history of diseases of the blood and blood-forming organs and certain disorders involving the immune mechanism: Secondary | ICD-10-CM

## 2020-01-05 MED ORDER — NORETHIN ACE-ETH ESTRAD-FE 1-20 MG-MCG PO TABS: 1.0000 | ORAL_TABLET | Freq: Every day | ORAL | 0 refills | Status: DC

## 2020-01-05 NOTE — Progress Notes (Signed)
GYNECOLOGY  VISIT   HPI: 34 y.o.   Single Black or African American Hispanic or Latino  female   [name] with Patient's last menstrual period was 12/15/2019.   here for  Ultrasound consult. She was seen at the end of May for a consultation from Dr Irene Limbo secondary to menorrhagia leading to anemia. Pelvic exam was concerning for a fibroid uterus.    She was started on OCP's to try and decrease her blood flow. She just started the 2nd week of the second pack. Her first cycle on OCP's was lighter than normal, but had more clots. She bleed x 7 days, changed her pad in 3 hours, not full. Cramps were less this cycle. She still had pre cycle cramps.  She had light spotting 2 days ago. She is taking her pills at the same time every day.  Last iron transfusion was 10/23/19.   Not currently sexually active.   GYNECOLOGIC HISTORY: Patient's last menstrual period was 12/15/2019. Contraception:Loestrin FE Menopausal hormone therapy: na         OB History    Gravida  0   Para  0   Term  0   Preterm  0   AB  0   Living  0     SAB  0   TAB  0   Ectopic  0   Multiple  0   Live Births  0              Patient Active Problem List   Diagnosis Date Noted  . Iron deficiency anemia 10/25/2015  . 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 Anemia     No past surgical history on file.  Current Outpatient Medications  Medication Sig Dispense Refill  . APPLE CIDER VINEGAR PO Take by mouth.    . ferrous sulfate 325 (65 FE) MG tablet Take 1 tablet (325 mg total) by mouth 2 (two) times daily with a meal. 180 tablet 3  . Multiple Vitamins-Minerals (MULTIVITAMIN) tablet Take 1 tablet by mouth daily.    . norethindrone-ethinyl estradiol (LOESTRIN FE) 1-20 MG-MCG tablet Take 1 tablet by mouth daily. 3 Package 0   No current facility-administered medications for this visit.     ALLERGIES: Bee venom and  Penicillins  Family History  Problem Relation Age of Onset  . Hypertension Mother     Social History   Socioeconomic History  . Marital status: Single    Spouse name: Not on file  . Number of children: Not on file  . Years of education: Not on file  . Highest education level: Not on file  Occupational History  . Not on file  Tobacco Use  . Smoking status: Never Smoker  . Smokeless tobacco: Never Used  Vaping Use  . Vaping Use: Never used  Substance and Sexual Activity  . Alcohol use: No  . Drug use: No  . Sexual activity: Not Currently  Other Topics Concern  . Not on file  Social History Narrative  . Not on file   Social Determinants of Health   Financial Resource Strain:   . Difficulty of Paying Living Expenses:   Food Insecurity:   . Worried About Charity fundraiser in the Last Year:   . Arboriculturist in the Last Year:   Transportation Needs:   . Film/video editor (Medical):   [name] Lack of Transportation (Non-Medical):  Physical Activity:   . Days of Exercise per Week:   . Minutes of Exercise per Session:   Stress:   . Feeling of Stress :   Social Connections:   . Frequency of Communication with Friends and Family:   . Frequency of Social Gatherings with Friends and Family:   . Attends Religious Services:   . Active Member of Clubs or Organizations:   . Attends Archivist Meetings:   [name] Marital Status:   Intimate Partner Violence:   . Fear of Current or Ex-Partner:   . Emotionally Abused:   [name] Physically Abused:   . Sexually Abused:     Review of Systems  All other systems reviewed and are negative.   PHYSICAL EXAMINATION:    BP 118/74   Pulse 88   Ht 5\' 6"  (1.676 m)   Wt 210 lb (95.3 kg)   LMP 12/15/2019   SpO2 98%   BMI 33.89 kg/m     General appearance: alert, cooperative and appears stated age  Ultrasound images reviewed with the patient  ASSESSMENT Menorrhagia leading to anemia Just had her first cycle on OCP's,  bleeding was lighter Fibroid uterus    PLAN Will continue with OCP's F/U in 6 weeks CBC, Ferritin today Discussed possibility of the mirena IUD, or Oriahnn    In addition to reviewing the ultrasound images, ~10 minutes was spent discussing fibroids and possible treatment options.

## 2020-01-05 NOTE — Patient Instructions (Addendum)
Uterine Fibroids  Uterine fibroids (leiomyomas) are noncancerous (benign) tumors that can develop in the uterus. Fibroids may also develop in the fallopian tubes, cervix, or tissues (ligaments) near the uterus. You may have one or many fibroids. Fibroids vary in size, weight, and where they grow in the uterus. Some can become quite large. Most fibroids do not require medical treatment. What are the causes? The cause of this condition is not known. What increases the risk? You are more likely to develop this condition if you:  Are in your 30s or 40s and have not gone through menopause.  Have a family history of this condition.  Are of African-American descent.  Had your first period at an early age (early menarche).  Have not had any children (nulliparity).  Are overweight or obese. What are the signs or symptoms? Many women do not have any symptoms. Symptoms of this condition may include:  Heavy menstrual bleeding.  Bleeding or spotting between periods.  Pain and pressure in the pelvic area, between the hips.  Bladder problems, such as needing to urinate urgently or more often than usual.  Inability to have children (infertility).  Failure to carry pregnancy to term (miscarriage). How is this diagnosed? This condition may be diagnosed based on:  Your symptoms and medical history.  A physical exam.  A pelvic exam that includes feeling for any tumors.  Imaging tests, such as ultrasound or MRI. How is this treated? Treatment for this condition may include:  Seeing your health care provider for follow-up visits to monitor your fibroids for any changes.  Taking NSAIDs such as ibuprofen, naproxen, or aspirin to reduce pain.  Hormone medicines. These may be taken as a pill, given in an injection, or delivered by a T-shaped device that is inserted into the uterus (intrauterine device, IUD).  Surgery to remove one of the following: ? The fibroids (myomectomy). Your health  care provider may recommend this if fibroids affect your fertility and you want to become pregnant. ? The uterus (hysterectomy). ? Blood supply to the fibroids (uterine artery embolization). Follow these instructions at home:  Take over-the-counter and prescription medicines only as told by your health care provider.  Ask your health care provider if you should take iron pills or eat more iron-rich foods, such as dark green, leafy vegetables. Heavy menstrual bleeding can cause low iron levels.  If directed, apply heat to your back or abdomen to reduce pain. Use the heat source that your health care provider recommends, such as a moist heat pack or a heating pad. ? Place a towel between your skin and the heat source. ? Leave the heat on for 20-30 minutes. ? Remove the heat if your skin turns bright red. This is especially important if you are unable to feel pain, heat, or cold. You may have a greater risk of getting burned.  Pay close attention to your menstrual cycle. Tell your health care provider about any changes, such as: ? Increased blood flow that requires you to use more pads or tampons than usual. ? A change in the number of days that your period lasts. ? A change in symptoms that are associated with your period, such as back pain or cramps in your abdomen.  Keep all follow-up visits as told by your health care provider. This is important, especially if your fibroids need to be monitored for any changes. Contact a health care provider if you:  Have pelvic pain, back pain, or cramps in your abdomen that   do not get better with medicine or heat.  Develop new bleeding between periods.  Have increased bleeding during or between periods.  Feel unusually tired or weak.  Feel light-headed. Get help right away if you:  Faint.  Have pelvic pain that suddenly gets worse.  Have severe vaginal bleeding that soaks a tampon or pad in 30 minutes or less. Summary  Uterine fibroids are  noncancerous (benign) tumors that can develop in the uterus.  The exact cause of this condition is not known.  Most fibroids do not require medical treatment unless they affect your ability to have children (fertility).  Contact a health care provider if you have pelvic pain, back pain, or cramps in your abdomen that do not get better with medicines.  Make sure you know what symptoms should cause you to get help right away. This information is not intended to replace advice given to you by your health care provider. Make sure you discuss any questions you have with your health care provider. Document Revised: 05/24/2017 Document Reviewed: 05/07/2017 Elsevier Patient Education  Teresita; Estradiol; Norethindrone acetate oral capsules What is this medicine? ELAGOLIX; ESTRADIOL; NORETHINDRONE (el a GOE lix; es tra DYE ole; nor eth IN drone) is used to treat heavy menstrual bleeding due to uterine fibroids. This medicine may be used for other purposes; ask your health care provider or pharmacist if you have questions. COMMON BRAND NAME(S): ORIAHNN What should I tell my health care provider before I take this medicine? They need to know if you have any of these conditions: blood vessel disease or blood clots breast, cervical, endometrial, or uterine cancer cigarette smoker diabetes gallbladder disease heart disease high blood pressure high cholesterol kidney disease liver disease lupus migraine headaches mental illness osteoporosis porphyria stroke suicidal thoughts, plans, or attempt; a previous suicide attempt by you or a family member unexplained vaginal bleeding an unusual or allergic reaction to elagolix, estrogens, progestins, other medicines, foods, dyes, or preservatives pregnant or trying to get pregnant breast-feeding How should I use this medicine? Take this medicine by mouth with a glass of water. You can take it with or without food. Follow the  directions on the prescription label. Take this medicine at the same times each day. Do not take your medicine more often than directed. A special MedGuide will be given to you by the pharmacist with each prescription and refill. Be sure to read this information carefully each time. Talk to your pediatrician regarding the use of this medicine in children. This medicine is not approved for use in children. Overdosage: If you think you have taken too much of this medicine contact a poison control center or emergency room at once. NOTE: This medicine is only for you. Do not share this medicine with others. What if I miss a dose? If you miss a dose, take it within 4 hours of the time that it was supposed to be taken and take the next dose at the usual time. If more than 4 hours have passed, skip the missed dose. The next dose should be taken at the usual time. Do not take double or extra doses. What may interact with this medicine? Do not take this medicine with any of the following medications: aromatase inhibitors like aminoglutethimide, anastrozole, exemestane, letrozole, testolactone cyclosporine gemfibrozil This medicine may also interact with the following medications: benzodiazepines bosentan bromocriptine certain antivirals for HIV or hepatitis certain medicines for seizures like carbamazepine, phenobarbital, phenytoin cimetidine citalopram cyclosporine dantrolene digoxin grapefruit  juice griseofulvin hydrocortisone, cortisone, or prednisolone isoniazid (INH) medicines for diabetes methadone methotrexate midazolam mineral oil omeprazole other female hormones, like estrogens or progestins and birth control pills, patches, rings, or injections raloxifene rifabutin, rifampin, or rifapentine rosuvastatin tamoxifen thyroid hormones topiramate tricyclic antidepressants warfarin This list may not describe all possible interactions. Give your health care provider a list of all  the medicines, herbs, non-prescription drugs, or dietary supplements you use. Also tell them if you smoke, drink alcohol, or use illegal drugs. Some items may interact with your medicine. What should I watch for while using this medicine? Visit your doctor or health care professional for regular checks on your progress. You will need a regular breast and pelvic exam while on this medicine. It may take several cycles of use to see improvement in your condition. You may have a change in bleeding pattern or irregular periods. Many females stop having periods while taking this drug. Smoking increases the risk of getting a blood clot or having a stroke while you are taking this medicine, especially if you are more than 35 years old. You are strongly advised not to smoke. This medicine can make your body retain fluid, making your fingers, hands, or ankles swell. Your blood pressure can go up. Contact your doctor or health care professional if you feel you are retaining fluid. This medicine may cause weak bones (osteoporosis). Only use this product for the amount of time your health care professional tells you to. The longer you use this product the more likely you will be at risk for weak bones. Ask your health care professional how you can keep strong bones. This medicine does not prevent pregnancy. Women must use effective birth control with this medicine. Use a non-hormonal form of birth control while taking this medicine and for 1 week after stopping it. Talk to your health care professional about how to prevent pregnancy. Do not become pregnant while taking this medicine. Women should inform their doctor if they wish to become pregnant or think they might be pregnant. There is a potential for serious side effects to an unborn child. Talk to your health care professional or pharmacist for more information. If you are going to have elective surgery, you may need to stop taking this medicine beforehand. Consult  your health care professional for advice prior to scheduling the surgery. If you wear contact lenses and notice visual changes, or if the lenses begin to feel uncomfortable, consult your eye care specialist. This medicine does not protect you against HIV infection (AIDS) or any other sexually transmitted diseases. What side effects may I notice from receiving this medicine? Side effects that you should report to your doctor or health care professional as soon as possible: allergic reactions like skin rash, itching or hives, swelling of the face, lips, or tongue anxious breast tissue changes or discharge breathing problems depressed mood high blood pressure signs and symptoms of a blood clot such as chest pain; shortness of breath; pain, swelling, or warmth in the leg signs and symptoms of a stroke like changes in vision; confusion; trouble speaking or understanding; severe headaches; sudden numbness or weakness of the face, arm or leg; trouble walking; dizziness; loss of balance or coordination signs and symptoms of liver injury like dark yellow or brown urine; general ill feeling or flu-like symptoms; light-colored stools; loss of appetite; nausea; right upper belly pain; unusually weak or tired; yellowing of the eyes or skin suicidal thoughts or other mood changes swelling of the ankles,  feet, hands unusual vaginal bleeding Side effects that usually do not require medical attention (report these to your doctor or health care professional if they continue or are bothersome): reduced or absent menstrual periods hair loss headache hot flashes or night sweats nausea tiredness This list may not describe all possible side effects. Call your doctor for medical advice about side effects. You may report side effects to FDA at 1-800-FDA-1088. Where should I keep my medicine? Keep out of the reach of children. Store at room temperature between 15 and 30 degrees C (59 and 86 degrees F). Throw away  any unused medicine after the expiration date on the label. NOTE: This sheet is a summary. It may not cover all possible information. If you have questions about this medicine, talk to your doctor, pharmacist, or health care provider.  2020 Elsevier/Gold Standard (2018-12-01 11:26:16)

## 2020-01-06 LAB — CBC
Hematocrit: 38 % (ref 34.0–46.6)
Hemoglobin: 12.4 g/dL (ref 11.1–15.9)
MCH: 27.6 pg (ref 26.6–33.0)
MCHC: 32.6 g/dL (ref 31.5–35.7)
MCV: 84 fL (ref 79–97)
Platelets: 264 10*3/uL (ref 150–450)
RBC: 4.5 x10E6/uL (ref 3.77–5.28)
RDW: 14.2 % (ref 11.7–15.4)
WBC: 3.4 10*3/uL (ref 3.4–10.8)

## 2020-01-06 LAB — FERRITIN: Ferritin: 37 ng/mL (ref 15–150)

## 2020-02-01 ENCOUNTER — Inpatient Hospital Stay: Payer: 59 | Attending: Hematology

## 2020-02-01 ENCOUNTER — Other Ambulatory Visit: Payer: Self-pay | Admitting: Hematology

## 2020-02-01 ENCOUNTER — Other Ambulatory Visit: Payer: Self-pay

## 2020-02-01 ENCOUNTER — Inpatient Hospital Stay (HOSPITAL_BASED_OUTPATIENT_CLINIC_OR_DEPARTMENT_OTHER): Payer: 59 | Admitting: Hematology

## 2020-02-01 VITALS — BP 147/83 | HR 74 | Temp 97.8°F | Resp 18 | Ht 66.0 in | Wt 219.2 lb

## 2020-02-01 DIAGNOSIS — D500 Iron deficiency anemia secondary to blood loss (chronic): Secondary | ICD-10-CM | POA: Diagnosis not present

## 2020-02-01 DIAGNOSIS — R7989 Other specified abnormal findings of blood chemistry: Secondary | ICD-10-CM | POA: Insufficient documentation

## 2020-02-01 DIAGNOSIS — D509 Iron deficiency anemia, unspecified: Secondary | ICD-10-CM | POA: Insufficient documentation

## 2020-02-01 DIAGNOSIS — N920 Excessive and frequent menstruation with regular cycle: Secondary | ICD-10-CM | POA: Insufficient documentation

## 2020-02-01 DIAGNOSIS — E669 Obesity, unspecified: Secondary | ICD-10-CM | POA: Diagnosis not present

## 2020-02-01 DIAGNOSIS — Z6835 Body mass index (BMI) 35.0-35.9, adult: Secondary | ICD-10-CM | POA: Insufficient documentation

## 2020-02-01 LAB — CBC WITH DIFFERENTIAL/PLATELET
Abs Immature Granulocytes: 0.01 10*3/uL (ref 0.00–0.07)
Basophils Absolute: 0 10*3/uL (ref 0.0–0.1)
Basophils Relative: 1 %
Eosinophils Absolute: 0.1 10*3/uL (ref 0.0–0.5)
Eosinophils Relative: 2 %
HCT: 34.9 % — ABNORMAL LOW (ref 36.0–46.0)
Hemoglobin: 11.1 g/dL — ABNORMAL LOW (ref 12.0–15.0)
Immature Granulocytes: 0 %
Lymphocytes Relative: 38 %
Lymphs Abs: 1.6 10*3/uL (ref 0.7–4.0)
MCH: 27.1 pg (ref 26.0–34.0)
MCHC: 31.8 g/dL (ref 30.0–36.0)
MCV: 85.1 fL (ref 80.0–100.0)
Monocytes Absolute: 0.5 10*3/uL (ref 0.1–1.0)
Monocytes Relative: 11 %
Neutro Abs: 2 10*3/uL (ref 1.7–7.7)
Neutrophils Relative %: 48 %
Platelets: 276 10*3/uL (ref 150–400)
RBC: 4.1 MIL/uL (ref 3.87–5.11)
RDW: 12.3 % (ref 11.5–15.5)
WBC: 4.2 10*3/uL (ref 4.0–10.5)
nRBC: 0 % (ref 0.0–0.2)

## 2020-02-01 LAB — CMP (CANCER CENTER ONLY)
ALT: 12 U/L (ref 0–44)
AST: 13 U/L — ABNORMAL LOW (ref 15–41)
Albumin: 3.5 g/dL (ref 3.5–5.0)
Alkaline Phosphatase: 48 U/L (ref 38–126)
Anion gap: 9 (ref 5–15)
BUN: 7 mg/dL (ref 6–20)
CO2: 25 mmol/L (ref 22–32)
Calcium: 9.9 mg/dL (ref 8.9–10.3)
Chloride: 103 mmol/L (ref 98–111)
Creatinine: 0.88 mg/dL (ref 0.44–1.00)
GFR, Est AFR Am: >60 mL/min (ref >60)
GFR, Estimated: >60 mL/min (ref >60)
Glucose, Bld: 144 mg/dL — ABNORMAL HIGH (ref 70–99)
Potassium: 3.5 mmol/L (ref 3.5–5.1)
Sodium: 137 mmol/L (ref 135–145)
Total Bilirubin: <0.2 mg/dL — ABNORMAL LOW (ref 0.3–1.2)
Total Protein: 7.4 g/dL (ref 6.5–8.1)

## 2020-02-01 LAB — IRON AND TIBC
Iron: 40 ug/dL — ABNORMAL LOW (ref 41–142)
Saturation Ratios: 9 % — ABNORMAL LOW (ref 21–57)
TIBC: 419 ug/dL (ref 236–444)
UIBC: 379 ug/dL (ref 120–384)

## 2020-02-01 LAB — FERRITIN: Ferritin: 10 ng/mL — ABNORMAL LOW (ref 11–307)

## 2020-02-01 LAB — VITAMIN B12: Vitamin B-12: 695 pg/mL (ref 180–914)

## 2020-02-01 MED ORDER — POLYSACCHARIDE IRON COMPLEX 150 MG PO CAPS: 150.0000 mg | ORAL_CAPSULE | Freq: Two times a day (BID) | ORAL | 5 refills | Status: DC

## 2020-02-01 NOTE — Progress Notes (Signed)
[name]    HEMATOLOGY/ONCOLOGY CLINIC NOTE  Date of Service: 02/01/2020  PCP: Doristine Section Bonsu MD  CHIEF COMPLAINTS/PURPOSE OF CONSULTATION:   Iron deficiency anemia.  HISTORY OF PRESENTING ILLNESS:   Tonya Gilbert is a wonderful 34 y.o. female who has been referred to [place] by Dr Benito Mccreedy MD for evaluation and management of  Iron deficiency anemia.  Patient is from [place] (speaks [place]) she is conversant in [place]. Has a history of obesity, iron deficiency anemia, vitamin D deficiency and dyslipidemia and was noted by her primary care physician on labs on 07/08/2015 to have significant anemia with a hemoglobin of 6.9 MCV 58 RDW of 19.3 and platelet count of 421. At the time her ferritin level was 3 and iron saturation of 3%. Vitamin B12 was 1289 folate 18.6. TSH was within normal limits at 0.65. [name]  She notes that she was started on ferrous sulfate 1 tablet daily that she has been taking in a compliant fashion. Notes some mild pica symptoms with ice craving. Minimal fatigue but does not feel it is limiting her work. Has been trying to work out regularly.   Notes that she was given a referral to gastroenterology for a colonoscopy but that they mentioned that they would like to get stool studies first.  Notes regular menstrual periods lasting 7 days with 1-2 heavy days of blood loss. No change in bowel habits. No overt blood in the stools or black stools. No overt hematuria. No other evidence of overt blood loss.  Notes that she has a fairly balanced diet with no dietary restrictions. No fevers/chills/night sweats/unexpected weight loss.  She is on phentermine for weight loss for her obesity.  No other acute new symptoms.  Interval History:   Tonya Gilbert returns today for management and evaluation of her iron deficiency anemia. The patient's last visit with [place] was on 10/09/2019. The pt reports that she is doing well overall.  The pt reports no significant  fatigue. Labs reviewed hgb 11.1, ferritin is down to 10  Still with ongoing menorrhagia likely from uterine fibroids - being managed by Dr Talbert Nan.  MEDICAL HISTORY:   #1 obesity #2 iron deficiency anemia #3 hypercholesterolemia not on medications  #4 obesity - on phentermine for weight loss #5 history of vitamin D deficiency #6 history of pre-diabetes  SURGICAL HISTORY: No previous surgeries  SOCIAL HISTORY: Social History   Socioeconomic History  . Marital status: Single    Spouse name: Not on file  . Number of children: Not on file  . Years of education: Not on file  . Highest education level: Not on file  Occupational History  . Not on file  Tobacco Use  . Smoking status: Never Smoker  . Smokeless tobacco: Never Used  Vaping Use  . Vaping Use: Never used  Substance and Sexual Activity  . Alcohol use: No  . Drug use: No  . Sexual activity: Not Currently  Other Topics Concern  . Not on file  Social History Narrative  . Not on file   Social Determinants of Health   Financial Resource Strain:   . Difficulty of Paying Living Expenses:   Food Insecurity:   . Worried About Charity fundraiser in the Last Year:   . Arboriculturist in the Last Year:   Transportation Needs:   . Film/video editor (Medical):   [name] Lack of Transportation (Non-Medical):   Physical Activity:   . Days of Exercise per Week:   .  Minutes of Exercise per Session:   Stress:   . Feeling of Stress :   Social Connections:   . Frequency of Communication with Friends and Family:   . Frequency of Social Gatherings with Friends and Family:   . Attends Religious Services:   . Active Member of Clubs or Organizations:   . Attends Archivist Meetings:   [name] Marital Status:   Intimate Partner Violence:   . Fear of Current or Ex-Partner:   . Emotionally Abused:   [name] Physically Abused:   . Sexually Abused:     FAMILY HISTORY: No family history of blood disorders or cancer that she  is aware of.  ALLERGIES:  is allergic to bee venom and penicillins.  MEDICATIONS:  Current Outpatient Medications  Medication Sig Dispense Refill  . APPLE CIDER VINEGAR PO Take by mouth.    . ferrous sulfate 325 (65 FE) MG tablet Take 1 tablet (325 mg total) by mouth 2 (two) times daily with a meal. 180 tablet 3  . Multiple Vitamins-Minerals (MULTIVITAMIN) tablet Take 1 tablet by mouth daily.    . norethindrone-ethinyl estradiol (LOESTRIN FE) 1-20 MG-MCG tablet Take 1 tablet by mouth daily. 28 tablet 0   No current facility-administered medications for this visit.    REVIEW OF SYSTEMS:   A 10+ POINT REVIEW OF SYSTEMS WAS OBTAINED including neurology, dermatology, psychiatry, cardiac, respiratory, lymph, extremities, GI, GU, Musculoskeletal, constitutional, breasts, reproductive, HEENT.  All pertinent positives are noted in the HPI.  All others are negative.   PHYSICAL EXAMINATION: ECOG PERFORMANCE STATUS: 1 - Symptomatic but completely ambulatory  . Vitals:   02/01/20 1341  BP: (!) 147/83  Pulse: 74  Resp: 18  Temp: 97.8 F (36.6 C)  SpO2: 100%   Filed Weights   02/01/20 1341  Weight: 219 lb 3.2 oz (99.4 kg)   .Body mass index is 35.38 kg/m.   NAD. GENERAL:alert, in no acute distress and comfortable SKIN: no acute rashes, no significant lesions EYES: conjunctiva are pink and non-injected, sclera anicteric OROPHARYNX: MMM, no exudates, no oropharyngeal erythema or ulceration NECK: supple, no JVD LYMPH:  no palpable lymphadenopathy in the cervical, axillary or inguinal regions LUNGS: clear to auscultation b/l with normal respiratory effort HEART: regular rate & rhythm ABDOMEN:  normoactive bowel sounds , non tender, not distended. Extremity: no pedal edema PSYCH: alert & oriented x 3 with fluent speech NEURO: no focal motor/sensory deficits   LABORATORY DATA:  I have reviewed the data as listed  . CBC Latest Ref Rng & Units 02/01/2020 01/05/2020 10/09/2019  WBC 4.0  - 10.5 K/uL 4.2 3.4 4.9  Hemoglobin 12.0 - 15.0 g/dL 11.1(L) 12.4 8.2(L)  Hematocrit 36 - 46 % 34.9(L) 38.0 29.5(L)  Platelets 150 - 400 K/uL 276 264 371   . CBC    Component Value Date/Time   WBC 4.2 02/01/2020 1325   RBC 4.10 02/01/2020 1325   HGB 11.1 (L) 02/01/2020 1325   HGB 12.4 01/05/2020 1508   HGB 12.5 10/25/2015 1608   HCT 34.9 (L) 02/01/2020 1325   HCT 38.0 01/05/2020 1508   HCT 38.2 10/25/2015 1608   PLT 276 02/01/2020 1325   PLT 264 01/05/2020 1508   MCV 85.1 02/01/2020 1325   MCV 84 01/05/2020 1508   MCV 81.3 10/25/2015 1608   MCH 27.1 02/01/2020 1325   MCHC 31.8 02/01/2020 1325   RDW 12.3 02/01/2020 1325   RDW 14.2 01/05/2020 1508   RDW 13.9 10/25/2015 1608   LYMPHSABS 1.6  02/01/2020 1325   LYMPHSABS 1.8 10/25/2015 1608   MONOABS 0.5 02/01/2020 1325   MONOABS 0.5 10/25/2015 1608   EOSABS 0.1 02/01/2020 1325   EOSABS 0.1 10/25/2015 1608   BASOSABS 0.0 02/01/2020 1325   BASOSABS 0.0 10/25/2015 1608     . CMP Latest Ref Rng & Units 07/22/2018 10/25/2015 03/11/2013  Glucose 70 - 99 mg/dL 97 94 114(H)  BUN 6 - 20 mg/dL 11 14.8 11  Creatinine 0.44 - 1.00 mg/dL 0.93 1.2(H) 0.82  Sodium 135 - 145 mmol/L 139 138 133(L)  Potassium 3.5 - 5.1 mmol/L 3.9 3.9 3.7  Chloride 98 - 111 mmol/L 104 - 98  CO2 22 - 32 mmol/L 27 27 25   Calcium 8.9 - 10.3 mg/dL 9.4 9.5 9.4  Total Protein 6.5 - 8.1 g/dL 7.7 7.7 8.0  Total Bilirubin 0.3 - 1.2 mg/dL 0.2(L) <0.30 0.1(L)  Alkaline Phos 38 - 126 U/L 42 51 45  AST 15 - 41 U/L 18 16 24   ALT 0 - 44 U/L 15 15 15    . Lab Results  Component Value Date   IRON 40 (L) 02/01/2020   TIBC 419 02/01/2020   IRONPCTSAT 9 (L) 02/01/2020   (Iron and TIBC)  Lab Results  Component Value Date   FERRITIN 10 (L) 02/01/2020    02/20/18 Anemia Profile B:    RADIOGRAPHIC STUDIES: I have personally reviewed the radiological images as listed and agreed with the findings in the report. US Pelvis Complete  Result Date: 01/05/2020 SEE  PROGRESS NOTES FOR RESULTS    ASSESSMENT & PLAN:   34 y.o. female from [place] with  #1 Iron deficiency anemia Patient had a hemoglobin of 6.9 with an MCV of 58, ferritin level of 3 with 3% iron saturation in January 2017. Had some mild reactive thrombocytosis at the time and normal WBC. She has been on iron replacement with oral ferrous sulfate 1 tablet by mouth daily. Labs today show that her hemoglobin has normalized at 12.5 and the MCV has normalized to 81.3 -The fact that her hemoglobin and microcytosis completely corrected with iron replacement rules out the possibility of an underlying hemoglobinopathy.  #2 reactive thrombocytosis -now normalized after iron replacement .  PLAN: -labs reviewed -ongoing menorrhagia likely from fibroids - following with gyn -Continue daily multivitamin  -will offer option for 1 dose of IV injectafer  RTC with Dr Irene Limbo with labs in 6 months  The total time spent in the appt was 20 minutes and more than 50% was on counseling and direct patient cares.  All of the patient's questions were answered with apparent satisfaction. The patient knows to call the clinic with any problems, questions or concerns.    Sullivan Lone MD [address] AAHIVMS [facility] [place] Hematology/Oncology Physician [facility]  (Office):       [phone] (Work cell):  [phone] (Fax):           [phone]  I, Yevette Edwards, am acting as a scribe for Dr. Sullivan Lone.   .I have reviewed the above documentation for accuracy and completeness, and I agree with the above. Brunetta Genera MD

## 2020-02-02 MED FILL — FERREX 150 CAPSULE: 150 | 30 days supply | Qty: 60 | Fill #0

## 2020-02-04 ENCOUNTER — Telehealth: Payer: Self-pay | Admitting: Hematology

## 2020-02-04 NOTE — Telephone Encounter (Signed)
Scheduled per los, patient has been called and voicemail was left. 

## 2020-02-10 ENCOUNTER — Telehealth: Payer: Self-pay | Admitting: *Deleted

## 2020-02-10 NOTE — Telephone Encounter (Signed)
Contacted patient regarding test results per Dr. Grier Mitts directions: Plz let patient know ferritin is down to 10. Iron stores with ongoing menorrhagia -- would recommend 1 additional dose of IV iron. If patient agreeable can schedule this Contacted patient with result information. LVM: Requested patient contact office to schedule IV iron.

## 2020-02-15 MED FILL — BLISOVI FE 1/20 1-20 MG-MCG: 1-20 | 28 days supply | Qty: 28 | Fill #0

## 2020-02-24 ENCOUNTER — Encounter: Payer: Self-pay | Admitting: Obstetrics and Gynecology

## 2020-02-24 ENCOUNTER — Other Ambulatory Visit: Payer: Self-pay

## 2020-02-24 ENCOUNTER — Ambulatory Visit: Payer: 59 | Admitting: Obstetrics and Gynecology

## 2020-02-24 VITALS — BP 122/76 | HR 93 | Ht 66.0 in | Wt 216.0 lb

## 2020-02-24 DIAGNOSIS — D259 Leiomyoma of uterus, unspecified: Secondary | ICD-10-CM | POA: Diagnosis not present

## 2020-02-24 DIAGNOSIS — N920 Excessive and frequent menstruation with regular cycle: Secondary | ICD-10-CM | POA: Diagnosis not present

## 2020-02-24 DIAGNOSIS — Z862 Personal history of diseases of the blood and blood-forming organs and certain disorders involving the immune mechanism: Secondary | ICD-10-CM | POA: Diagnosis not present

## 2020-02-24 MED ORDER — NORETHIN ACE-ETH ESTRAD-FE 1-20 MG-MCG PO TABS
1.0000 | ORAL_TABLET | Freq: Every day | ORAL | 0 refills | Status: DC
Start: 2020-02-24 — End: 2020-05-11

## 2020-02-24 NOTE — Progress Notes (Signed)
GYNECOLOGY  VISIT   HPI: 34 y.o.   Single Black or African American Hispanic or Latino  female   [name] with Patient's last menstrual period was 02/09/2020.   here for f/u of menorrhagia leading to anemia. She was started on OCP's 3 months ago and is here for f/u. She had an ultrasound in July that showed an enlarged uterus with several intramural and subserosal fibroids (the largest was 6 x 4 cm). She says that period is shorter. She says the first day was just spotting. The 2nd and 3rd days were heavy then the remainder of her period was lite. She says that the cramping seems better.   On OCP's she is bleeding monthly x 5.5 days. 2 days are heavy. She is saturating a pad in 2-3 hours. She is passing some clots, quarter sized. Not lighter, but not heavy for as many days. Previously would bleed heavily x 3 days. Cramping is controlled with Advil.  Her Hgb was 8.2 in 10/09/19, it was 11.1 on 02/01/20 with a ferritin 10. Last iron transfusion was in April. Her oral iron was increased from 1 x a day to 2 x a day.   She doesn't currently have a partner. Would like to find a partner and have a child.    GYNECOLOGIC HISTORY: Patient's last menstrual period was 02/09/2020. Contraception: Loestrin FE Menopausal hormone therapy: none        OB History    Gravida  0   Para  0   Term  0   Preterm  0   AB  0   Living  0     SAB  0   TAB  0   Ectopic  0   Multiple  0   Live Births  0              Patient Active Problem List   Diagnosis Date Noted  . Iron deficiency anemia 10/25/2015  . 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 Anemia     History reviewed. No pertinent surgical history.  Current Outpatient Medications  Medication Sig Dispense Refill  . APPLE CIDER VINEGAR PO Take by mouth.    . iron polysaccharides (NIFEREX) 150 MG capsule Take 1 capsule (150 mg total) by mouth 2 (two)  times daily. 60 capsule 5  . Multiple Vitamins-Minerals (MULTIVITAMIN) tablet Take 1 tablet by mouth daily.    . norethindrone-ethinyl estradiol (LOESTRIN FE) 1-20 MG-MCG tablet Take 1 tablet by mouth daily. 28 tablet 0   No current facility-administered medications for this visit.     ALLERGIES: Bee venom and Penicillins  Family History  Problem Relation Age of Onset  . Hypertension Mother     Social History   Socioeconomic History  . Marital status: Single    Spouse name: Not on file  . Number of children: Not on file  . Years of education: Not on file  . Highest education level: Not on file  Occupational History  . Not on file  Tobacco Use  . Smoking status: Never Smoker  . Smokeless tobacco: Never Used  Vaping Use  . Vaping Use: Never used  Substance and Sexual Activity  . Alcohol use: No  . Drug use: No  . Sexual activity: Not Currently  Other Topics Concern  . Not on file  Social History Narrative  . Not on file   Social Determinants of Health   Financial  Resource Strain:   . Difficulty of Paying Living Expenses: Not on file  Food Insecurity:   . Worried About Charity fundraiser in the Last Year: Not on file  . Ran Out of Food in the Last Year: Not on file  Transportation Needs:   . Lack of Transportation (Medical): Not on file  . Lack of Transportation (Non-Medical): Not on file  Physical Activity:   . Days of Exercise per Week: Not on file  . Minutes of Exercise per Session: Not on file  Stress:   . Feeling of Stress : Not on file  Social Connections:   . Frequency of Communication with Friends and Family: Not on file  . Frequency of Social Gatherings with Friends and Family: Not on file  . Attends Religious Services: Not on file  . Active Member of Clubs or Organizations: Not on file  . Attends Archivist Meetings: Not on file  . Marital Status: Not on file  Intimate Partner Violence:   . Fear of Current or Ex-Partner: Not on file  .  Emotionally Abused: Not on file  . Physically Abused: Not on file  . Sexually Abused: Not on file    Review of Systems  All other systems reviewed and are negative.   PHYSICAL EXAMINATION:    BP 122/76   Pulse 93   Ht 5\' 6"  (1.676 m)   Wt 216 lb (98 kg)   LMP 02/09/2020   SpO2 99%   BMI 34.86 kg/m     General appearance: alert, cooperative and appears stated age  ASSESSMENT Fibroid uterus Menorrhagia leading to anemia, oral iron increased 3 weeks ago On OCP's cycles are a little better Wants to retain fertility    PLAN Continue OCP's for now F/U at the end of this month for CBC, Ferritin If her anemia is controlled she can continue on OCP's and iron Discussed options of trial of mirena IUD, lysteda, or Orhiann  Referral to Dr Kerin Perna to discuss possible myomectomy    Over 20 minutes was spent in total patient care  CC: Dr Irene Limbo

## 2020-02-24 NOTE — Patient Instructions (Signed)

## 2020-03-10 MED FILL — BLISOVI FE 1/20 1-20 MG-MCG: 1-20 | 84 days supply | Qty: 84 | Fill #0

## 2020-03-10 MED FILL — FERREX 150 CAPSULE: 150 | 30 days supply | Qty: 60 | Fill #1

## 2020-03-14 ENCOUNTER — Other Ambulatory Visit: Payer: Self-pay

## 2020-03-14 ENCOUNTER — Other Ambulatory Visit (INDEPENDENT_AMBULATORY_CARE_PROVIDER_SITE_OTHER): Payer: 59

## 2020-03-14 ENCOUNTER — Other Ambulatory Visit: Payer: Self-pay | Admitting: Obstetrics and Gynecology

## 2020-03-14 DIAGNOSIS — N920 Excessive and frequent menstruation with regular cycle: Secondary | ICD-10-CM | POA: Diagnosis not present

## 2020-03-14 DIAGNOSIS — Z862 Personal history of diseases of the blood and blood-forming organs and certain disorders involving the immune mechanism: Secondary | ICD-10-CM

## 2020-03-15 LAB — CBC
Hematocrit: 24.6 % — ABNORMAL LOW (ref 34.0–46.6)
Hemoglobin: 7.2 g/dL — ABNORMAL LOW (ref 11.1–15.9)
MCH: 24.3 pg — ABNORMAL LOW (ref 26.6–33.0)
MCHC: 29.3 g/dL — ABNORMAL LOW (ref 31.5–35.7)
MCV: 83 fL (ref 79–97)
NRBC: 1 % — ABNORMAL HIGH (ref 0–0)
Platelets: 299 10*3/uL (ref 150–450)
RBC: 2.96 x10E6/uL — ABNORMAL LOW (ref 3.77–5.28)
RDW: 13.2 % (ref 11.7–15.4)
WBC: 4.6 10*3/uL (ref 3.4–10.8)

## 2020-03-15 LAB — FERRITIN: Ferritin: 8 ng/mL — ABNORMAL LOW (ref 15–150)

## 2020-03-17 ENCOUNTER — Telehealth: Payer: Self-pay

## 2020-03-17 NOTE — Telephone Encounter (Signed)
Tonya Dom, MD  P [facility] Triage Pool Cc: Brunetta Genera, MD Please inform the patient that her anemia is worse, her Hgb was 7.2 yesterday and her ferritin was 8. Please check if she just had a cycle and if she is currently bleeding? Check if she is symptomatic from the anemia. Her cycles sounded better at her last visit. Please make sure she is taking her iron supplements and call her Oncologists office to get her in for an iron transfusion.  See if she has seen Dr Kerin Perna yet for possible myomectomy? I think we need to consider one of the options we discussed at her visit earlier this month.  CC: Dr Irene Limbo

## 2020-03-17 NOTE — Telephone Encounter (Signed)
Received message from scheduling to call Colletta Maryland at [phone] with patient's PCP Dr. Radene Knee. Called and spoke with Colletta Maryland who advised that PCP would like patient scheduled for an Iron infusion ASAP.  TCT patient regarding an appointment for this infusion and she requested sometime next week preferably in the afternoon.   Scheduling message sent.

## 2020-03-17 NOTE — Telephone Encounter (Signed)
Spoke with [place], RN at Dr Grier Mitts office. Pt to be called from [facility] to be scheduled for iron infusion.  Routing to Dr Talbert Nan for review.  Encounter closed.

## 2020-03-17 NOTE — Telephone Encounter (Signed)
Left urgent message with Dr Grier Mitts nurse to return call for set up iron infusion appt.

## 2020-03-17 NOTE — Telephone Encounter (Signed)
Spoke with pt. Pt given results and recommendations per Dr Talbert Nan. Pt agreeable and verbalized understanding.  Pt states had cycle from 9/15-9/22. Pt states 9/15-9/17 had light bleeding 03/12/20- had heavy bleeding that lasted 1 hour, "sat on toilet and blood poured out" denies clots , had sharp abd pain/cramps "felt like knife twisting". Pt states had headache and was dizzy, laid in bed the rest of day. States took Advil OTC for pain, it resolved pain.   Sunday 9/19-9/22 bleeding was lighter and normal. Changed pad every 3-4 hours. Denies clots and blood loss symptoms of SOB, increased HR, dizzy or lightheaded. Pt states went to work on Sunday and been feeling good since cycle.   Pt states is taking iron supplements daily and taking her OCPs daily as directed.  Pt states has not currently made an appt with Dr Kerin Perna, but has phone message to return call to make appt. Pt advised to call office as soon as she could to get appt. Pt verbalized understanding.   Pt states ok with appt at [facility]. Pt advised this RN will call and make appt for iron infusion. Pt agreeable.  Call placed to [facility], left message for return call.

## 2020-03-18 ENCOUNTER — Telehealth: Payer: Self-pay | Admitting: Hematology

## 2020-03-18 ENCOUNTER — Other Ambulatory Visit: Payer: Self-pay | Admitting: *Deleted

## 2020-03-18 DIAGNOSIS — D259 Leiomyoma of uterus, unspecified: Secondary | ICD-10-CM

## 2020-03-18 DIAGNOSIS — N920 Excessive and frequent menstruation with regular cycle: Secondary | ICD-10-CM

## 2020-03-18 DIAGNOSIS — N852 Hypertrophy of uterus: Secondary | ICD-10-CM

## 2020-03-18 DIAGNOSIS — D509 Iron deficiency anemia, unspecified: Secondary | ICD-10-CM

## 2020-03-18 DIAGNOSIS — Z862 Personal history of diseases of the blood and blood-forming organs and certain disorders involving the immune mechanism: Secondary | ICD-10-CM

## 2020-03-18 NOTE — Telephone Encounter (Signed)
Scheduled appt per 9/23 sch msg - pt aware of appt date and time

## 2020-03-21 ENCOUNTER — Inpatient Hospital Stay: Payer: 59 | Attending: Hematology

## 2020-03-21 ENCOUNTER — Other Ambulatory Visit: Payer: Self-pay

## 2020-03-21 VITALS — BP 130/87 | HR 90 | Temp 99.0°F | Resp 18

## 2020-03-21 DIAGNOSIS — D508 Other iron deficiency anemias: Secondary | ICD-10-CM

## 2020-03-21 DIAGNOSIS — D509 Iron deficiency anemia, unspecified: Secondary | ICD-10-CM | POA: Insufficient documentation

## 2020-03-21 MED ORDER — ACETAMINOPHEN 325 MG PO TABS
650.0000 mg | ORAL_TABLET | Freq: Once | ORAL | Status: AC
  Administered 2020-03-21: 650 mg via ORAL

## 2020-03-21 MED ORDER — LORATADINE 10 MG PO TABS
10.0000 mg | ORAL_TABLET | Freq: Once | ORAL | Status: AC
  Administered 2020-03-21: 10 mg via ORAL

## 2020-03-21 MED ORDER — LORATADINE 10 MG PO TABS
ORAL_TABLET | ORAL | Status: AC
  Filled 2020-03-21: qty 1

## 2020-03-21 MED ORDER — SODIUM CHLORIDE 0.9 % IV SOLN
INTRAVENOUS | Status: DC
  Filled 2020-03-21 (×2): qty 250

## 2020-03-21 MED ORDER — SODIUM CHLORIDE 0.9 % IV SOLN
750.0000 mg | Freq: Once | INTRAVENOUS | Status: AC
  Administered 2020-03-21: 750 mg via INTRAVENOUS
  Filled 2020-03-21: qty 15

## 2020-03-21 MED ORDER — ACETAMINOPHEN 325 MG PO TABS
ORAL_TABLET | ORAL | Status: AC
  Filled 2020-03-21: qty 2

## 2020-03-21 NOTE — Patient Instructions (Signed)

## 2020-03-21 NOTE — Progress Notes (Signed)
Pt stayed for entire post infusion 30 min observation period.  Tolerated well.  VS stable.

## 2020-03-29 ENCOUNTER — Telehealth: Payer: Self-pay | Admitting: *Deleted

## 2020-03-29 DIAGNOSIS — E559 Vitamin D deficiency, unspecified: Secondary | ICD-10-CM | POA: Diagnosis not present

## 2020-03-29 DIAGNOSIS — D72819 Decreased white blood cell count, unspecified: Secondary | ICD-10-CM | POA: Diagnosis not present

## 2020-03-29 DIAGNOSIS — E669 Obesity, unspecified: Secondary | ICD-10-CM | POA: Diagnosis not present

## 2020-03-29 DIAGNOSIS — D649 Anemia, unspecified: Secondary | ICD-10-CM | POA: Diagnosis not present

## 2020-03-29 DIAGNOSIS — R7303 Prediabetes: Secondary | ICD-10-CM | POA: Diagnosis not present

## 2020-03-29 DIAGNOSIS — E785 Hyperlipidemia, unspecified: Secondary | ICD-10-CM | POA: Diagnosis not present

## 2020-03-29 NOTE — Telephone Encounter (Addendum)
Left message to call Sharee Pimple, RN at [place].   See 03/14/20 Result note and recommendations.  Per review of Epic, no appt scheduled with Dr. Kerin Perna for possible myomectomy.  Iron infusion completed on 03/21/20 Call placed to patient to offer OV to further discuss tx options, including the mirena IUD, lysteda, or Orhiann.

## 2020-03-30 NOTE — Telephone Encounter (Signed)
Call placed to follow up on referral to [facility].

## 2020-04-05 NOTE — Telephone Encounter (Signed)
General 03/31/2020 8:27 AM Magdalene Patricia - -  Note   Appointment scheduled 07/12/20          Left message to return call to make plan with Dr Talbert Nan before Dr Narda Bonds.

## 2020-04-08 NOTE — Telephone Encounter (Signed)
Left message for pt to return call to triage RN. 

## 2020-04-28 MED FILL — FERREX 150 CAPSULE: 150 | 30 days supply | Qty: 60 | Fill #2

## 2020-05-02 ENCOUNTER — Other Ambulatory Visit: Payer: Self-pay | Admitting: Obstetrics and Gynecology

## 2020-05-02 NOTE — Telephone Encounter (Signed)
Tried calling patient, no answer. Left message for patient to call me back. 

## 2020-05-02 NOTE — Telephone Encounter (Signed)
Medication refill request: LOESTRIN FE Last OV:  02/24/20 Dr. Talbert Nan Next AEX: none Last MMG (if hormonal medication request): n/a Refill authorized: Today, please advise

## 2020-05-02 NOTE — Telephone Encounter (Signed)
The patient was give a 3 month supply of OCP's in early September, she was very anemic at that visit and needs to look at other options of treatment. Please check if she has gotten in to see Dr Kerin Perna, if not she should come back to discuss other options.

## 2020-05-05 NOTE — Telephone Encounter (Signed)
Have attempted to call pt multiple times with no return call for OV before Dr Kerin Perna appt in Jan 2022.  Routing to Dr Talbert Nan, Please advise

## 2020-05-06 NOTE — Telephone Encounter (Signed)
Will close the encounter

## 2020-05-11 ENCOUNTER — Other Ambulatory Visit: Payer: Self-pay | Admitting: Obstetrics and Gynecology

## 2020-05-11 MED FILL — BLISOVI FE 1/20 1-20 MG-MCG: 1-20 | 84 days supply | Qty: 84 | Fill #0

## 2020-05-11 NOTE — Telephone Encounter (Signed)
Refill of pills sent.  Please let her know that there is a new procedure, Sonata, which is a minimally invasive way to treat fibroids. We can discuss it at her office visit.

## 2020-05-11 NOTE — Telephone Encounter (Signed)
Message left to return call to Triage Nurse at [phone].    

## 2020-05-11 NOTE — Telephone Encounter (Signed)
[place] called regarding refill status. States patient has misplaced birth control and needs a refill.

## 2020-05-11 NOTE — Telephone Encounter (Signed)
Spoke with patient regarding refill and message written by Dr. Talbert Nan. Patient verbalizes understanding and is agreeable. Per patient, will not be seen by Dr. Kerin Perna until January 2022. Patient states that she has not started her third month of OCPs because she misplaced the pack while moving. Placed patient on schedule for 05/12/20 to discuss other treatment options. Patient to call after speaking with supervisor if she is unable to leave work for appointment. Routing to Dr. Talbert Nan for final review.

## 2020-05-12 ENCOUNTER — Other Ambulatory Visit: Payer: Self-pay

## 2020-05-12 ENCOUNTER — Other Ambulatory Visit: Payer: Self-pay | Admitting: Obstetrics and Gynecology

## 2020-05-12 ENCOUNTER — Encounter: Payer: Self-pay | Admitting: Obstetrics and Gynecology

## 2020-05-12 ENCOUNTER — Ambulatory Visit (INDEPENDENT_AMBULATORY_CARE_PROVIDER_SITE_OTHER): Payer: 59 | Admitting: Obstetrics and Gynecology

## 2020-05-12 VITALS — BP 120/76 | HR 70 | Resp 16 | Wt 210.0 lb

## 2020-05-12 DIAGNOSIS — Z862 Personal history of diseases of the blood and blood-forming organs and certain disorders involving the immune mechanism: Secondary | ICD-10-CM

## 2020-05-12 DIAGNOSIS — D259 Leiomyoma of uterus, unspecified: Secondary | ICD-10-CM

## 2020-05-12 DIAGNOSIS — N920 Excessive and frequent menstruation with regular cycle: Secondary | ICD-10-CM | POA: Diagnosis not present

## 2020-05-12 MED ORDER — NORETHIN ACE-ETH ESTRAD-FE 1-20 MG-MCG PO TABS
1.0000 | ORAL_TABLET | Freq: Every day | ORAL | 0 refills | Status: DC
Start: 2020-05-12 — End: 2020-08-15

## 2020-05-12 NOTE — Progress Notes (Signed)
GYNECOLOGY  VISIT   HPI: 34 y.o.   Single Black or African American Hispanic or Latino  female   [name] with Patient's last menstrual period was 05/07/2020 (exact date).   here for  Consult to discuss alternative options to control her menorrhagia.  She has a h/o fibroid uterus and menorrhagia leading to severe anemia. She was started on OCP's in 6/21.  At her f/u visit in 9/21 her Hgb was 7.2 gm/dl. She had an iron transfusion at the end of September.    Regular menstrual cycle, clotting, random flow, some nausea & mild cramping. Uses maxi pad. Cycles are monthly x 7 days, very heavy for 1 day where she can saturate a pad in 1.5 hours, passes big clots. She can sit on the toilet for 20 minutes just passing clots and bleeding. She had very heavy bleeding 3 days ago, felt light headed. Currently feeling better, not lightheaded.   She lost a pack of pills and needs a refill for one month  She has an appointment with Dr Kerin Perna in mid January.   GYNECOLOGIC HISTORY: Patient's last menstrual period was 05/07/2020 (exact date). Contraception:ocp Menopausal hormone therapy: none        OB History    Gravida  0   Para  0   Term  0   Preterm  0   AB  0   Living  0     SAB  0   TAB  0   Ectopic  0   Multiple  0   Live Births  0              Patient Active Problem List   Diagnosis Date Noted  . Iron deficiency anemia 10/25/2015  . 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 Anemia     History reviewed. No pertinent surgical history.  Current Outpatient Medications  Medication Sig Dispense Refill  . APPLE CIDER VINEGAR PO Take by mouth.    . iron polysaccharides (NIFEREX) 150 MG capsule Take 1 capsule (150 mg total) by mouth 2 (two) times daily. 60 capsule 5  . Multiple Vitamins-Minerals (MULTIVITAMIN) tablet Take 1 tablet by mouth daily.    . norethindrone-ethinyl estradiol  (BLISOVI FE 1/20) 1-20 MG-MCG tablet Take 1 tablet by mouth daily. 28 tablet 0   No current facility-administered medications for this visit.     ALLERGIES: Bee venom and Penicillins  Family History  Problem Relation Age of Onset  . Hypertension Mother     Social History   Socioeconomic History  . Marital status: Single    Spouse name: Not on file  . Number of children: Not on file  . Years of education: Not on file  . Highest education level: Not on file  Occupational History  . Not on file  Tobacco Use  . Smoking status: Never Smoker  . Smokeless tobacco: Never Used  Vaping Use  . Vaping Use: Never used  Substance and Sexual Activity  . Alcohol use: No  . Drug use: No  . Sexual activity: Not Currently  Other Topics Concern  . Not on file  Social History Narrative  . Not on file   Social Determinants of Health   Financial Resource Strain:   . Difficulty of Paying Living Expenses: Not on file  Food Insecurity:   . Worried About Charity fundraiser in the Last Year: Not on file  . Ran  Out of Food in the Last Year: Not on file  Transportation Needs:   . Lack of Transportation (Medical): Not on file  . Lack of Transportation (Non-Medical): Not on file  Physical Activity:   . Days of Exercise per Week: Not on file  . Minutes of Exercise per Session: Not on file  Stress:   . Feeling of Stress : Not on file  Social Connections:   . Frequency of Communication with Friends and Family: Not on file  . Frequency of Social Gatherings with Friends and Family: Not on file  . Attends Religious Services: Not on file  . Active Member of Clubs or Organizations: Not on file  . Attends Archivist Meetings: Not on file  . Marital Status: Not on file  Intimate Partner Violence:   . Fear of Current or Ex-Partner: Not on file  . Emotionally Abused: Not on file  . Physically Abused: Not on file  . Sexually Abused: Not on file    Review of Systems  Constitutional:  Negative.   HENT: Negative.   Eyes: Negative.   Respiratory: Negative.   Cardiovascular: Negative.   Gastrointestinal: Negative.   Genitourinary: Negative.   Musculoskeletal: Negative.   Skin: Negative.   Neurological: Negative.   Endo/Heme/Allergies: Negative.   Psychiatric/Behavioral: Negative.     PHYSICAL EXAMINATION:    BP 120/76   Pulse 70   Resp 16   Wt 210 lb (95.3 kg)   LMP 05/07/2020 (Exact Date)   BMI 33.89 kg/m     General appearance: alert, cooperative and appears stated age  hgb 8.6  ASSESSMENT Fibroid uterus Menorrhagia leading to anemia, not controlled on OCP's Desire to retain fertility   PLAN Previously discussed a trial of mirena IUD, lysteda or Gifford Shave Today we further discussed the option of orhiann, also discussed [place] fibroid ablation and myomectomy (has an appointment to discuss myomectomy with Dr Kerin Perna) She is interested in [place] fibroid ablation. 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 Information on [employer] given CBC, ferritin   Over 20 minutes in total patient care

## 2020-05-12 NOTE — Telephone Encounter (Signed)
Pt had OV with Dr Talbert Nan on 05/12/20 Encounter closed

## 2020-05-13 ENCOUNTER — Encounter: Payer: Self-pay | Admitting: Obstetrics and Gynecology

## 2020-05-13 LAB — CBC
Hematocrit: 27.3 % — ABNORMAL LOW (ref 34.0–46.6)
Hemoglobin: 8.6 g/dL — ABNORMAL LOW (ref 11.1–15.9)
MCH: 25.7 pg — ABNORMAL LOW (ref 26.6–33.0)
MCHC: 31.5 g/dL (ref 31.5–35.7)
MCV: 82 fL (ref 79–97)
Platelets: 262 10*3/uL (ref 150–450)
RBC: 3.34 x10E6/uL — ABNORMAL LOW (ref 3.77–5.28)
RDW: 15.5 % — ABNORMAL HIGH (ref 11.7–15.4)
WBC: 4.6 10*3/uL (ref 3.4–10.8)

## 2020-05-13 LAB — FERRITIN: Ferritin: 17 ng/mL (ref 15–150)

## 2020-05-18 ENCOUNTER — Telehealth: Payer: Self-pay

## 2020-05-18 NOTE — Telephone Encounter (Signed)
Call to patient. Per DPR, OK to leave message on voicemail.   Left voicemail requesting a return call to [facility] regarding patient consent form for Sonata procedure.

## 2020-06-08 NOTE — Telephone Encounter (Signed)
Will wait for patient to return call to proceed with Sonata procedure.  Encounter closed.

## 2020-06-13 MED FILL — FERREX 150 CAPSULE: 150 | 30 days supply | Qty: 60 | Fill #3

## 2020-07-12 DIAGNOSIS — D251 Intramural leiomyoma of uterus: Secondary | ICD-10-CM | POA: Diagnosis not present

## 2020-07-13 MED FILL — FERREX 150 CAPSULE: 150 | 30 days supply | Qty: 60 | Fill #4

## 2020-07-16 ENCOUNTER — Other Ambulatory Visit (HOSPITAL_COMMUNITY): Payer: Self-pay | Admitting: Obstetrics and Gynecology

## 2020-07-20 ENCOUNTER — Telehealth: Payer: Self-pay | Admitting: *Deleted

## 2020-07-20 NOTE — Telephone Encounter (Signed)
-----   Message from Salvadore Dom, MD sent at 05/13/2020 12:38 PM EST ----- Please let the patient know the results of her CBC and ferritin and see if you can back her back into hematology for another iron transfusion.

## 2020-07-20 NOTE — Telephone Encounter (Signed)
Left message to call triage  at [facility], [phone].   MyChart message to patient.

## 2020-07-20 NOTE — Telephone Encounter (Signed)
Georgia Lopes, RN  06/08/2020 4:31 PM EST Back to Top     LM 12/15   Georgia Lopes, [place]  05/13/2020 2:18 PM EST      LM 11/19

## 2020-07-26 NOTE — Telephone Encounter (Signed)
Will close the encounter  

## 2020-07-26 NOTE — Telephone Encounter (Signed)
No return call from patient.   MyChart message  Last read by Glo Herring at 3:04 PM on 07/20/2020.  Per review of Epic, patient is scheduled to see hematology on 08/03/20.    Dr. Talbert Nan -please advise.

## 2020-08-01 MED FILL — BLISOVI FE 1/20 1-20 MG-MCG: 1-20 | 28 days supply | Qty: 28 | Fill #0

## 2020-08-01 MED FILL — FERREX 150 CAPSULE: 150 | 30 days supply | Qty: 60 | Fill #4

## 2020-08-02 NOTE — Progress Notes (Signed)
[name]    HEMATOLOGY/ONCOLOGY CLINIC NOTE  Date of Service: 08/02/2020  PCP: Doristine Section Bonsu MD  CHIEF COMPLAINTS/PURPOSE OF CONSULTATION:   Iron deficiency anemia.  HISTORY OF PRESENTING ILLNESS:   Tonya Gilbert is a wonderful 35 y.o. female who has been referred to [place] by Dr Benito Mccreedy MD for evaluation and management of  Iron deficiency anemia.  Patient is from [place] (speaks [place]) she is conversant in [place]. Has a history of obesity, iron deficiency anemia, vitamin D deficiency and dyslipidemia and was noted by her primary care physician on labs on 07/08/2015 to have significant anemia with a hemoglobin of 6.9 MCV 58 RDW of 19.3 and platelet count of 421. At the time her ferritin level was 3 and iron saturation of 3%. Vitamin B12 was 1289 folate 18.6. TSH was within normal limits at 0.65. [name]  She notes that she was started on ferrous sulfate 1 tablet daily that she has been taking in a compliant fashion. Notes some mild pica symptoms with ice craving. Minimal fatigue but does not feel it is limiting her work. Has been trying to work out regularly.   Notes that she was given a referral to gastroenterology for a colonoscopy but that they mentioned that they would like to get stool studies first.  Notes regular menstrual periods lasting 7 days with 1-2 heavy days of blood loss. No change in bowel habits. No overt blood in the stools or black stools. No overt hematuria. No other evidence of overt blood loss.  Notes that she has a fairly balanced diet with no dietary restrictions. No fevers/chills/night sweats/unexpected weight loss.  She is on phentermine for weight loss for her obesity.  No other acute new symptoms.  Interval History:   Tonya Gilbert returns today for management and evaluation of her iron deficiency anemia. The patient's last visit with [place] was on 02/01/2020. The pt reports that she is doing well overall.  The pt reports that she has been feeling  very fatigued recently. The pt noted that she went to her OBGYN Dr. Talbert Nan who sent her to a fertility doctor that instructed her she has fibroids. She is still on the birth control pills that are not controlling her periods well. She notes they tried to do medication but have not gotten insurance approval yet. The pt is unsure of the name of this medication. This was in the end of January 2022. They are trying to preserve her uterus with the medication to shrink the fibroids for 3 months prior to having to do surgery.  The pt notes her period started two days ago and is bleeding actively. The pt notes that she has been taking OTC Iron BID.   Lab results today 08/03/2020 of CBC w/diff is as follows: all values are WNL except for Rbc of 2.83, Hgb of 5.6, HCT of 20.4, MCV of 72.1, Mch of 19.8, MCHC of 27.5, RDW of 17.4, nRBC of 0.8. 08/03/2020 Ferritin < 4 08/03/2020 Iron sat 20%  On review of systems, pt reports fatigue, cramping, active bleeding and denies abdominal pain, leg swelling, back pain and any other symptoms.  MEDICAL HISTORY:   #1 obesity #2 iron deficiency anemia #3 hypercholesterolemia not on medications  #4 obesity - on phentermine for weight loss #5 history of vitamin D deficiency #6 history of pre-diabetes  SURGICAL HISTORY: No previous surgeries  SOCIAL HISTORY: Social History   Socioeconomic History  . Marital status: Single    Spouse name: Not on  file  . Number of children: Not on file  . Years of education: Not on file  . Highest education level: Not on file  Occupational History  . Not on file  Tobacco Use  . Smoking status: Never Smoker  . Smokeless tobacco: Never Used  Vaping Use  . Vaping Use: Never used  Substance and Sexual Activity  . Alcohol use: No  . Drug use: No  . Sexual activity: Not Currently  Other Topics Concern  . Not on file  Social History Narrative  . Not on file   Social Determinants of Health   Financial Resource Strain: Not  on file  Food Insecurity: Not on file  Transportation Needs: Not on file  Physical Activity: Not on file  Stress: Not on file  Social Connections: Not on file  Intimate Partner Violence: Not on file    FAMILY HISTORY: No family history of blood disorders or cancer that she is aware of.  ALLERGIES:  is allergic to bee venom and penicillins.  MEDICATIONS:  Current Outpatient Medications  Medication Sig Dispense Refill  . APPLE CIDER VINEGAR PO Take by mouth.    . iron polysaccharides (NIFEREX) 150 MG capsule Take 1 capsule (150 mg total) by mouth 2 (two) times daily. 60 capsule 5  . Multiple Vitamins-Minerals (MULTIVITAMIN) tablet Take 1 tablet by mouth daily.    . norethindrone-ethinyl estradiol (BLISOVI FE 1/20) 1-20 MG-MCG tablet Take 1 tablet by mouth daily. 28 tablet 0   No current facility-administered medications for this visit.    REVIEW OF SYSTEMS:   10 Point review of Systems was done is negative except as noted above.  PHYSICAL EXAMINATION: ECOG PERFORMANCE STATUS: 1 - Symptomatic but completely ambulatory  . Vitals:   08/03/20 1030  BP: 132/74  Pulse: 98  Resp: 20  Temp: 97.7 F (36.5 C)  SpO2: 100%   Filed Weights   08/03/20 1030  Weight: 208 lb 4.8 oz (94.5 kg)   .Body mass index is 33.62 kg/m.   GENERAL:alert, in no acute distress and comfortable SKIN: no acute rashes, no significant lesions EYES: conjunctiva are pink and non-injected, sclera anicteric OROPHARYNX: MMM, no exudates, no oropharyngeal erythema or ulceration NECK: supple, no JVD LYMPH:  no palpable lymphadenopathy in the cervical, axillary or inguinal regions LUNGS: clear to auscultation b/l with normal respiratory effort HEART: regular rate & rhythm ABDOMEN:  normoactive bowel sounds , non tender, not distended. Extremity: no pedal edema PSYCH: alert & oriented x 3 with fluent speech NEURO: no focal motor/sensory deficits  LABORATORY DATA:  I have reviewed the data as  listed  . CBC Latest Ref Rng & Units 08/03/2020 08/03/2020 05/12/2020  WBC 4.0 - 10.5 K/uL 5.7 6.0 4.6  Hemoglobin 12.0 - 15.0 g/dL 5.4(LL) 5.6(LL) 8.6(L)  Hematocrit 36.0 - 46.0 % 20.0(L) 20.4(L) 27.3(L)  Platelets 150 - 400 K/uL 276 286 262   . CBC    Component Value Date/Time   WBC 4.6 05/12/2020 1005   WBC 4.2 02/01/2020 1325   RBC 3.34 (L) 05/12/2020 1005   RBC 4.10 02/01/2020 1325   HGB 8.6 (L) 05/12/2020 1005   HGB 12.5 10/25/2015 1608   HCT 27.3 (L) 05/12/2020 1005   HCT 38.2 10/25/2015 1608   PLT 262 05/12/2020 1005   MCV 82 05/12/2020 1005   MCV 81.3 10/25/2015 1608   MCH 25.7 (L) 05/12/2020 1005   MCH 27.1 02/01/2020 1325   MCHC 31.5 05/12/2020 1005   MCHC 31.8 02/01/2020 1325  RDW 15.5 (H) 05/12/2020 1005   RDW 13.9 10/25/2015 1608   LYMPHSABS 1.6 02/01/2020 1325   LYMPHSABS 1.8 10/25/2015 1608   MONOABS 0.5 02/01/2020 1325   MONOABS 0.5 10/25/2015 1608   EOSABS 0.1 02/01/2020 1325   EOSABS 0.1 10/25/2015 1608   BASOSABS 0.0 02/01/2020 1325   BASOSABS 0.0 10/25/2015 1608     . CMP Latest Ref Rng & Units 02/01/2020 07/22/2018 10/25/2015  Glucose 70 - 99 mg/dL 144(H) 97 94  BUN 6 - 20 mg/dL 7 11 14.8  Creatinine 0.44 - 1.00 mg/dL 0.88 0.93 1.2(H)  Sodium 135 - 145 mmol/L 137 139 138  Potassium 3.5 - 5.1 mmol/L 3.5 3.9 3.9  Chloride 98 - 111 mmol/L 103 104 -  CO2 22 - 32 mmol/L 25 27 27   Calcium 8.9 - 10.3 mg/dL 9.9 9.4 9.5  Total Protein 6.5 - 8.1 g/dL 7.4 7.7 7.7  Total Bilirubin 0.3 - 1.2 mg/dL <0.2(L) 0.2(L) <0.30  Alkaline Phos 38 - 126 U/L 48 42 51  AST 15 - 41 U/L 13(L) 18 16  ALT 0 - 44 U/L 12 15 15    . Lab Results  Component Value Date   IRON 91 08/03/2020   TIBC 455 (H) 08/03/2020   IRONPCTSAT 20 (L) 08/03/2020   (Iron and TIBC)  Lab Results  Component Value Date   FERRITIN <4 (L) 08/03/2020   02/20/18 Anemia Profile B:    RADIOGRAPHIC STUDIES: I have personally reviewed the radiological images as listed and agreed with the findings  in the report. No results found.  ASSESSMENT & PLAN:   35 y.o. female from [place] with  #1 Iron deficiency anemia Patient had a hemoglobin of 6.9 with an MCV of 58, ferritin level of 3 with 3% iron saturation in January 2017. Had some mild reactive thrombocytosis at the time and normal WBC. She has been on iron replacement with oral ferrous sulfate 1 tablet by mouth daily. Labs today show that her hemoglobin has normalized at 12.5 and the MCV has normalized to 81.3 -The fact that her hemoglobin and microcytosis completely corrected with iron replacement rules out the possibility of an underlying hemoglobinopathy.  #2 reactive thrombocytosis -now normalized after iron replacement .  PLAN: -Discussed pt labs today, 08/03/2020; pt's Hgb is 5.6 and will require immediate transfusions. -Advised pt her current Hgb is 1/3 the normal and needed amount. She is having bleeding that is uncontrolled and their is an immediate plan needed for her from the OBGYN specialists. -Will continue to monitor her blood counts. Advised pt she may have passed out if no doctor's visit today. -Recommend 2 units PRBC and weekly doses IV Iron Injectafer. The pt is agreeable to this.  -Do not recommend pt return to work until blood counts are improved. -Advise pt she will need to see her OBGYN ASAP. Messaged her Ob gyn - Dr Talbert Nan -Advised pt she should not drive herself today due to high risk of passing out. -Advised pt that continued bleeding will lead to ED visits. Utmost importance to f/u w OBGYN ASAP.    FOLLOW UP: 2 units of PRBC today stat IV INjectafer weekly x 3 doses starting today Labs in 1 week with 2nd iron infusion.  The total time spent in the appointment was 30 minutes and more than 50% was on counseling and direct patient cares.  All of the patient's questions were answered with apparent satisfaction. The patient knows to call the clinic with any problems, questions or concerns.  Sullivan Lone MD [place] AAHIVMS [facility] Hematology/Oncology Physician [facility]  (Office):       [phone] (Work cell):  [phone] (Fax):           [phone]  I, Reinaldo Raddle, am acting as scribe for Dr. Sullivan Lone, MD.     .I have reviewed the above documentation for accuracy and completeness, and I agree with the above. Brunetta Genera MD

## 2020-08-03 ENCOUNTER — Inpatient Hospital Stay: Payer: 59

## 2020-08-03 ENCOUNTER — Other Ambulatory Visit: Payer: Self-pay

## 2020-08-03 ENCOUNTER — Other Ambulatory Visit: Payer: Self-pay | Admitting: *Deleted

## 2020-08-03 ENCOUNTER — Inpatient Hospital Stay (HOSPITAL_BASED_OUTPATIENT_CLINIC_OR_DEPARTMENT_OTHER): Payer: 59 | Admitting: Hematology

## 2020-08-03 ENCOUNTER — Inpatient Hospital Stay: Payer: 59 | Attending: Hematology

## 2020-08-03 ENCOUNTER — Ambulatory Visit: Payer: 59

## 2020-08-03 ENCOUNTER — Telehealth: Payer: Self-pay

## 2020-08-03 VITALS — BP 180/93 | HR 78 | Temp 98.2°F | Resp 18

## 2020-08-03 VITALS — BP 132/74 | HR 98 | Temp 97.7°F | Resp 20 | Ht 66.0 in | Wt 208.3 lb

## 2020-08-03 DIAGNOSIS — D649 Anemia, unspecified: Secondary | ICD-10-CM | POA: Diagnosis not present

## 2020-08-03 DIAGNOSIS — N920 Excessive and frequent menstruation with regular cycle: Secondary | ICD-10-CM

## 2020-08-03 DIAGNOSIS — D509 Iron deficiency anemia, unspecified: Secondary | ICD-10-CM

## 2020-08-03 DIAGNOSIS — D75838 Other thrombocytosis: Secondary | ICD-10-CM | POA: Diagnosis not present

## 2020-08-03 DIAGNOSIS — D508 Other iron deficiency anemias: Secondary | ICD-10-CM

## 2020-08-03 DIAGNOSIS — D500 Iron deficiency anemia secondary to blood loss (chronic): Secondary | ICD-10-CM

## 2020-08-03 DIAGNOSIS — E559 Vitamin D deficiency, unspecified: Secondary | ICD-10-CM | POA: Insufficient documentation

## 2020-08-03 LAB — CBC WITH DIFFERENTIAL/PLATELET
Abs Immature Granulocytes: 0.02 10*3/uL (ref 0.00–0.07)
Abs Immature Granulocytes: 0.03 10*3/uL (ref 0.00–0.07)
Basophils Absolute: 0 10*3/uL (ref 0.0–0.1)
Basophils Absolute: 0 10*3/uL (ref 0.0–0.1)
Basophils Relative: 1 %
Basophils Relative: 1 %
Eosinophils Absolute: 0.1 10*3/uL (ref 0.0–0.5)
Eosinophils Absolute: 0.1 10*3/uL (ref 0.0–0.5)
Eosinophils Relative: 1 %
Eosinophils Relative: 1 %
HCT: 20.4 % — ABNORMAL LOW (ref 36.0–46.0)
HCT: 20 % — ABNORMAL LOW (ref 36.0–46.0)
Hemoglobin: 5.6 g/dL — CL (ref 12.0–15.0)
Hemoglobin: 5.4 g/dL — CL (ref 12.0–15.0)
Immature Granulocytes: 0 %
Immature Granulocytes: 1 %
Lymphocytes Relative: 27 %
Lymphocytes Relative: 28 %
Lymphs Abs: 1.6 10*3/uL (ref 0.7–4.0)
Lymphs Abs: 1.6 10*3/uL (ref 0.7–4.0)
MCH: 19.8 pg — ABNORMAL LOW (ref 26.0–34.0)
MCH: 19.4 pg — ABNORMAL LOW (ref 26.0–34.0)
MCHC: 27.5 g/dL — ABNORMAL LOW (ref 30.0–36.0)
MCHC: 27 g/dL — ABNORMAL LOW (ref 30.0–36.0)
MCV: 72.1 fL — ABNORMAL LOW (ref 80.0–100.0)
MCV: 71.9 fL — ABNORMAL LOW (ref 80.0–100.0)
Monocytes Absolute: 0.4 10*3/uL (ref 0.1–1.0)
Monocytes Absolute: 0.5 10*3/uL (ref 0.1–1.0)
Monocytes Relative: 7 %
Monocytes Relative: 8 %
Neutro Abs: 3.8 10*3/uL (ref 1.7–7.7)
Neutro Abs: 3.5 10*3/uL (ref 1.7–7.7)
Neutrophils Relative %: 64 %
Neutrophils Relative %: 61 %
Platelets: 286 10*3/uL (ref 150–400)
Platelets: 276 10*3/uL (ref 150–400)
RBC: 2.83 MIL/uL — ABNORMAL LOW (ref 3.87–5.11)
RBC: 2.78 MIL/uL — ABNORMAL LOW (ref 3.87–5.11)
RDW: 17.4 % — ABNORMAL HIGH (ref 11.5–15.5)
RDW: 17.4 % — ABNORMAL HIGH (ref 11.5–15.5)
WBC: 6 10*3/uL (ref 4.0–10.5)
WBC: 5.7 10*3/uL (ref 4.0–10.5)
nRBC: 0.8 % — ABNORMAL HIGH (ref 0.0–0.2)
nRBC: 0.9 % — ABNORMAL HIGH (ref 0.0–0.2)

## 2020-08-03 LAB — IRON AND TIBC
Iron: 91 ug/dL (ref 41–142)
Saturation Ratios: 20 % — ABNORMAL LOW (ref 21–57)
TIBC: 455 ug/dL — ABNORMAL HIGH (ref 236–444)
UIBC: 364 ug/dL (ref 120–384)

## 2020-08-03 LAB — SAMPLE TO BLOOD BANK

## 2020-08-03 LAB — PREPARE RBC (CROSSMATCH)

## 2020-08-03 LAB — ABO/RH: ABO/RH(D): O POS

## 2020-08-03 LAB — FERRITIN: Ferritin: <4 ng/mL — ABNORMAL LOW (ref 11–307)

## 2020-08-03 MED ORDER — LORATADINE 10 MG PO TABS
10.0000 mg | ORAL_TABLET | Freq: Once | ORAL | Status: AC
  Administered 2020-08-03: 10 mg via ORAL

## 2020-08-03 MED ORDER — SODIUM CHLORIDE 0.9% IV SOLUTION
250.0000 mL | Freq: Once | INTRAVENOUS | Status: AC
  Administered 2020-08-03: 250 mL via INTRAVENOUS
  Filled 2020-08-03: qty 250

## 2020-08-03 MED ORDER — ACETAMINOPHEN 325 MG PO TABS
650.0000 mg | ORAL_TABLET | Freq: Once | ORAL | Status: AC
  Administered 2020-08-03: 650 mg via ORAL

## 2020-08-03 MED ORDER — SODIUM CHLORIDE 0.9 % IV SOLN
Freq: Once | INTRAVENOUS | Status: AC
  Filled 2020-08-03: qty 250

## 2020-08-03 MED ORDER — METHYLPREDNISOLONE SODIUM SUCC 125 MG IJ SOLR
INTRAMUSCULAR | Status: AC
  Filled 2020-08-03: qty 2

## 2020-08-03 MED ORDER — LORATADINE 10 MG PO TABS
ORAL_TABLET | ORAL | Status: AC
  Filled 2020-08-03: qty 1

## 2020-08-03 MED ORDER — METHYLPREDNISOLONE SODIUM SUCC 125 MG IJ SOLR
60.0000 mg | Freq: Once | INTRAMUSCULAR | Status: AC
  Administered 2020-08-03: 60 mg via INTRAVENOUS

## 2020-08-03 MED ORDER — SODIUM CHLORIDE 0.9 % IV SOLN
200.0000 mg | Freq: Once | INTRAVENOUS | Status: AC
  Administered 2020-08-03: 200 mg via INTRAVENOUS
  Filled 2020-08-03: qty 200

## 2020-08-03 MED ORDER — ACETAMINOPHEN 325 MG PO TABS
ORAL_TABLET | ORAL | Status: AC
  Filled 2020-08-03: qty 2

## 2020-08-03 NOTE — Progress Notes (Signed)
If tolerates first dose of Venofer 200 mg IVPB with no issues the weekly dose may be increased to 300 mg over 90 minutes x 2.  T.O. Dr Wayna Chalet, PharmD 08/03/20 @1200 

## 2020-08-03 NOTE — Patient Instructions (Signed)
Iron Sucrose injection What is this medicine? IRON SUCROSE (AHY ern SOO krohs) is an iron complex. Iron is used to make healthy red blood cells, which carry oxygen and nutrients throughout the body. This medicine is used to treat iron deficiency anemia in people with chronic kidney disease. This medicine may be used for other purposes; ask your health care provider or pharmacist if you have questions. COMMON BRAND NAME(S): Venofer What should I tell my health care provider before I take this medicine? They need to know if you have any of these conditions:  anemia not caused by low iron levels  heart disease  high levels of iron in the blood  kidney disease  liver disease  an unusual or allergic reaction to iron, other medicines, foods, dyes, or preservatives  pregnant or trying to get pregnant  breast-feeding How should I use this medicine? This medicine is for infusion into a vein. It is given by a health care professional in a hospital or clinic setting. Talk to your pediatrician regarding the use of this medicine in children. While this drug may be prescribed for children as young as 2 years for selected conditions, precautions do apply. Overdosage: If you think you have taken too much of this medicine contact a poison control center or emergency room at once. NOTE: This medicine is only for you. Do not share this medicine with others. What if I miss a dose? It is important not to miss your dose. Call your doctor or health care professional if you are unable to keep an appointment. What may interact with this medicine? Do not take this medicine with any of the following medications:  deferoxamine  dimercaprol  other iron products This medicine may also interact with the following medications:  chloramphenicol  deferasirox This list may not describe all possible interactions. Give your health care provider a list of all the medicines, herbs, non-prescription drugs, or  dietary supplements you use. Also tell them if you smoke, drink alcohol, or use illegal drugs. Some items may interact with your medicine. What should I watch for while using this medicine? Visit your doctor or healthcare professional regularly. Tell your doctor or healthcare professional if your symptoms do not start to get better or if they get worse. You may need blood work done while you are taking this medicine. You may need to follow a special diet. Talk to your doctor. Foods that contain iron include: whole grains/cereals, dried fruits, beans, or peas, leafy green vegetables, and organ meats (liver, kidney). What side effects may I notice from receiving this medicine? Side effects that you should report to your doctor or health care professional as soon as possible:  allergic reactions like skin rash, itching or hives, swelling of the face, lips, or tongue  breathing problems  changes in blood pressure  cough  fast, irregular heartbeat  feeling faint or lightheaded, falls  fever or chills  flushing, sweating, or hot feelings  joint or muscle aches/pains  seizures  swelling of the ankles or feet  unusually weak or tired Side effects that usually do not require medical attention (report to your doctor or health care professional if they continue or are bothersome):  diarrhea  feeling achy  headache  irritation at site where injected  nausea, vomiting  stomach upset  tiredness This list may not describe all possible side effects. Call your doctor for medical advice about side effects. You may report side effects to FDA at 1-800-FDA-1088. Where should I keep   my medicine? This drug is given in a hospital or clinic and will not be stored at home. NOTE: This sheet is a summary. It may not cover all possible information. If you have questions about this medicine, talk to your doctor, pharmacist, or health care provider.  2021 Elsevier/Gold Standard (2011-03-22  17:14:35) Blood Transfusion, Adult, Care After This sheet gives you information about how to care for yourself after your procedure. Your doctor may also give you more specific instructions. If you have problems or questions, contact your doctor. What can I expect after the procedure? After the procedure, it is common to have:  Bruising and soreness at the IV site.  A fever or chills on the day of the procedure. This may be your body's response to the new blood cells received.  A headache. Follow these instructions at home: Insertion site care  Follow instructions from your doctor about how to take care of your insertion site. This is where an IV tube was put into your vein. Make sure you: ? Wash your hands with soap and water before and after you change your bandage (dressing). If you cannot use soap and water, use hand sanitizer. ? Change your bandage as told by your doctor.  Check your insertion site every day for signs of infection. Check for: ? Redness, swelling, or pain. ? Bleeding from the site. ? Warmth. ? Pus or a bad smell.      General instructions  Take over-the-counter and prescription medicines only as told by your doctor.  Rest as told by your doctor.  Go back to your normal activities as told by your doctor.  Keep all follow-up visits as told by your doctor. This is important. Contact a doctor if:  You have itching or red, swollen areas of skin (hives).  You feel worried or nervous (anxious).  You feel weak after doing your normal activities.  You have redness, swelling, warmth, or pain around the insertion site.  You have blood coming from the insertion site, and the blood does not stop with pressure.  You have pus or a bad smell coming from the insertion site. Get help right away if:  You have signs of a serious reaction. This may be coming from an allergy or the body's defense system (immune system). Signs include: ? Trouble breathing or shortness  of breath. ? Swelling of the face or feeling warm (flushed). ? Fever or chills. ? Head, chest, or back pain. ? Dark pee (urine) or blood in the pee. ? Widespread rash. ? Fast heartbeat. ? Feeling dizzy or light-headed. You may receive your blood transfusion in an outpatient setting. If so, you will be told whom to contact to report any reactions. These symptoms may be an emergency. Do not wait to see if the symptoms will go away. Get medical help right away. Call your local emergency services (911 in the [place] Do not drive yourself to the hospital. Summary  Bruising and soreness at the IV site are common.  Check your insertion site every day for signs of infection.  Rest as told by your doctor. Go back to your normal activities as told by your doctor.  Get help right away if you have signs of a serious reaction. This information is not intended to replace advice given to you by your health care provider. Make sure you discuss any questions you have with your health care provider. Document Revised: 12/04/2018 Document Reviewed: 12/04/2018 Elsevier Patient Education  2021 Elsevier Inc.    Inc.  

## 2020-08-03 NOTE — Telephone Encounter (Signed)
CRITICAL VALUE STICKER  CRITICAL VALUE: Hgb = 5.6  RECEIVER (on-site recipient of call): Yetta Glassman, [place] NOTIFIED: 08/03/20 at 10:30am  MESSENGER (representative from lab): Ulice Dash  MD NOTIFIED: Dr. Irene Limbo  TIME OF NOTIFICATION: 08/03/20 at 10:32am  RESPONSE: Notification given to [place], RN for follow-up with provider.

## 2020-08-04 ENCOUNTER — Encounter: Payer: Self-pay | Admitting: Obstetrics and Gynecology

## 2020-08-04 LAB — TYPE AND SCREEN
ABO/RH(D): O POS
Antibody Screen: NEGATIVE
Unit division: 0
Unit division: 0

## 2020-08-04 LAB — BPAM RBC
Blood Product Expiration Date: 202203122359
Blood Product Expiration Date: 202203122359
ISSUE DATE / TIME: 202202091301
ISSUE DATE / TIME: 202202091301
Unit Type and Rh: 5100
Unit Type and Rh: 5100

## 2020-08-05 ENCOUNTER — Telehealth: Payer: Self-pay | Admitting: Obstetrics and Gynecology

## 2020-08-05 NOTE — Telephone Encounter (Signed)
Please reach out to the patient, her Hematologist contacted me because she has been dangerously anemic. Please ask her to come in again so we can firm up a treatment plan. Thanks

## 2020-08-08 NOTE — Telephone Encounter (Signed)
Left message for patient to call.

## 2020-08-10 ENCOUNTER — Inpatient Hospital Stay: Payer: 59

## 2020-08-10 ENCOUNTER — Other Ambulatory Visit: Payer: Self-pay | Admitting: *Deleted

## 2020-08-10 ENCOUNTER — Ambulatory Visit: Payer: 59

## 2020-08-10 ENCOUNTER — Other Ambulatory Visit: Payer: 59

## 2020-08-10 ENCOUNTER — Other Ambulatory Visit: Payer: Self-pay

## 2020-08-10 VITALS — BP 142/79 | HR 78 | Temp 99.0°F | Resp 18

## 2020-08-10 DIAGNOSIS — D508 Other iron deficiency anemias: Secondary | ICD-10-CM

## 2020-08-10 DIAGNOSIS — D509 Iron deficiency anemia, unspecified: Secondary | ICD-10-CM

## 2020-08-10 DIAGNOSIS — E559 Vitamin D deficiency, unspecified: Secondary | ICD-10-CM | POA: Diagnosis not present

## 2020-08-10 DIAGNOSIS — D649 Anemia, unspecified: Secondary | ICD-10-CM

## 2020-08-10 DIAGNOSIS — D75838 Other thrombocytosis: Secondary | ICD-10-CM | POA: Diagnosis not present

## 2020-08-10 LAB — CBC WITH DIFFERENTIAL (CANCER CENTER ONLY)
Abs Immature Granulocytes: 0.01 10*3/uL (ref 0.00–0.07)
Basophils Absolute: 0 10*3/uL (ref 0.0–0.1)
Basophils Relative: 1 %
Eosinophils Absolute: 0.1 10*3/uL (ref 0.0–0.5)
Eosinophils Relative: 1 %
HCT: 33.5 % — ABNORMAL LOW (ref 36.0–46.0)
Hemoglobin: 9.8 g/dL — ABNORMAL LOW (ref 12.0–15.0)
Immature Granulocytes: 0 %
Lymphocytes Relative: 34 %
Lymphs Abs: 2.2 10*3/uL (ref 0.7–4.0)
MCH: 23.1 pg — ABNORMAL LOW (ref 26.0–34.0)
MCHC: 29.3 g/dL — ABNORMAL LOW (ref 30.0–36.0)
MCV: 78.8 fL — ABNORMAL LOW (ref 80.0–100.0)
Monocytes Absolute: 0.6 10*3/uL (ref 0.1–1.0)
Monocytes Relative: 10 %
Neutro Abs: 3.5 10*3/uL (ref 1.7–7.7)
Neutrophils Relative %: 54 %
Platelet Count: 426 10*3/uL — ABNORMAL HIGH (ref 150–400)
RBC: 4.25 MIL/uL (ref 3.87–5.11)
RDW: 21.2 % — ABNORMAL HIGH (ref 11.5–15.5)
WBC Count: 6.4 10*3/uL (ref 4.0–10.5)
nRBC: 0 % (ref 0.0–0.2)

## 2020-08-10 LAB — SAMPLE TO BLOOD BANK

## 2020-08-10 MED ORDER — SODIUM CHLORIDE 0.9% FLUSH
10.0000 mL | Freq: Once | INTRAVENOUS | Status: DC | PRN
  Filled 2020-08-10: qty 10

## 2020-08-10 MED ORDER — HEPARIN SOD (PORK) LOCK FLUSH 100 UNIT/ML IV SOLN
250.0000 [IU] | Freq: Once | INTRAVENOUS | Status: DC | PRN
  Filled 2020-08-10: qty 5

## 2020-08-10 MED ORDER — FAMOTIDINE IN NACL 20-0.9 MG/50ML-% IV SOLN: 20.0000 mg | Freq: Once | INTRAVENOUS | Status: DC | PRN

## 2020-08-10 MED ORDER — DIPHENHYDRAMINE HCL 50 MG/ML IJ SOLN: 50.0000 mg | Freq: Once | INTRAMUSCULAR | Status: DC | PRN

## 2020-08-10 MED ORDER — LORATADINE 10 MG PO TABS
10.0000 mg | ORAL_TABLET | Freq: Once | ORAL | Status: AC
  Administered 2020-08-10: 10 mg via ORAL

## 2020-08-10 MED ORDER — METHYLPREDNISOLONE SODIUM SUCC 125 MG IJ SOLR: 125.0000 mg | Freq: Once | INTRAMUSCULAR | Status: DC | PRN

## 2020-08-10 MED ORDER — ACETAMINOPHEN 325 MG PO TABS
650.0000 mg | ORAL_TABLET | Freq: Once | ORAL | Status: AC
  Administered 2020-08-10: 650 mg via ORAL

## 2020-08-10 MED ORDER — SODIUM CHLORIDE 0.9 % IV SOLN
Freq: Once | INTRAVENOUS | Status: DC | PRN
  Filled 2020-08-10: qty 250

## 2020-08-10 MED ORDER — SODIUM CHLORIDE 0.9% FLUSH
3.0000 mL | Freq: Once | INTRAVENOUS | Status: DC | PRN
  Filled 2020-08-10: qty 10

## 2020-08-10 MED ORDER — ALTEPLASE 2 MG IJ SOLR
2.0000 mg | Freq: Once | INTRAMUSCULAR | Status: DC | PRN
  Filled 2020-08-10: qty 2

## 2020-08-10 MED ORDER — HEPARIN SOD (PORK) LOCK FLUSH 100 UNIT/ML IV SOLN
500.0000 [IU] | Freq: Once | INTRAVENOUS | Status: DC | PRN
  Filled 2020-08-10: qty 5

## 2020-08-10 MED ORDER — SODIUM CHLORIDE 0.9 % IV SOLN
Freq: Once | INTRAVENOUS | Status: AC
  Filled 2020-08-10: qty 250

## 2020-08-10 MED ORDER — SODIUM CHLORIDE 0.9 % IV SOLN
300.0000 mg | Freq: Once | INTRAVENOUS | Status: AC
  Administered 2020-08-10: 300 mg via INTRAVENOUS
  Filled 2020-08-10: qty 10

## 2020-08-10 MED ORDER — EPINEPHRINE 0.3 MG/0.3ML IJ SOAJ: 0.3000 mg | Freq: Once | INTRAMUSCULAR | Status: DC | PRN

## 2020-08-10 MED ORDER — ALBUTEROL SULFATE (2.5 MG/3ML) 0.083% IN NEBU
2.5000 mg | INHALATION_SOLUTION | Freq: Once | RESPIRATORY_TRACT | Status: DC | PRN
  Filled 2020-08-10: qty 3

## 2020-08-10 MED ORDER — LORATADINE 10 MG PO TABS
ORAL_TABLET | ORAL | Status: AC
  Filled 2020-08-10: qty 1

## 2020-08-10 MED ORDER — SODIUM CHLORIDE 0.9 % IV SOLN: 200.0000 mg | Freq: Once | INTRAVENOUS | Status: DC

## 2020-08-10 MED ORDER — ACETAMINOPHEN 325 MG PO TABS
ORAL_TABLET | ORAL | Status: AC
  Filled 2020-08-10: qty 2

## 2020-08-10 NOTE — Telephone Encounter (Signed)
Appt was made on 08/09/20 and she is scheduled for 08/15/20 at 3:20pm.

## 2020-08-10 NOTE — Telephone Encounter (Signed)
The patient sent a mychart message from 08/04/20 trying to get an appointment. Can you please try reaching out to her again. I'm worried about her anemia.

## 2020-08-10 NOTE — Progress Notes (Signed)
Patient was observed for 30 minutes post-infusion. Vitals stable and no complaints upon discharge.

## 2020-08-10 NOTE — Patient Instructions (Signed)

## 2020-08-11 LAB — TYPE AND SCREEN
ABO/RH(D): O POS
Antibody Screen: POSITIVE
PT AG Type: NEGATIVE

## 2020-08-11 NOTE — Progress Notes (Signed)
GYNECOLOGY  VISIT   HPI: 35 y.o.   Single Black or African American Hispanic or Latino  female   [name] with Patient's last menstrual period was 08/01/2020.   here for f/u of menorrhagia and anemia.  She has a h/o a fibroid uterus and menorrhagia leading to severe anemia. On 08/04/19 her hgb was 5.4 gm/dl, she had a blood transfusion and then an iron transfusion with Dr Irene Limbo. On 08/10/20 her hgb was up to 9.8.  No help with her cycles on OCP's. She wants to retain fertility.  She is still on OCP's We have previously discussed a mirena IUD, lysteda, Gifford Shave, sonata fibroid ablation and myomectomy.      Her cycles are monthly x 4-5 days. Very heavy x 2 days. She can saturate a pad in up to 30 minutes for up to 2 days. She occasionally misses work because of it.   Ultrasound on 01/05/20 showed multiple myomas ranging in size from 1.5 to 5 cm. The largest myoma is intramural, but deviates the endometrium.   She was seen at Dr Charlett Lango office by the PA. They advised her to have surgery and she doesn't want to take the time off from work.   Not sexually active, doesn't have a boyfriend.   GYNECOLOGIC HISTORY: Patient's last menstrual period was 08/01/2020. Contraception: ocp  Menopausal hormone therapy none        OB History    Gravida  0   Para  0   Term  0   Preterm  0   AB  0   Living  0     SAB  0   IAB  0   Ectopic  0   Multiple  0   Live Births  0              Patient Active Problem List   Diagnosis Date Noted  . Iron deficiency anemia 10/25/2015  . 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 Anemia     History reviewed. No pertinent surgical history.  Current Outpatient Medications  Medication Sig Dispense Refill  . APPLE CIDER VINEGAR PO Take by mouth.    . iron polysaccharides (NIFEREX) 150 MG capsule Take 1 capsule (150 mg total) by mouth 2 (two) times daily. 60  capsule 5  . Multiple Vitamins-Minerals (MULTIVITAMIN) tablet Take 1 tablet by mouth daily.    . norethindrone-ethinyl estradiol (BLISOVI FE 1/20) 1-20 MG-MCG tablet Take 1 tablet by mouth daily. 28 tablet 0   No current facility-administered medications for this visit.     ALLERGIES: Bee venom and Penicillins  Family History  Problem Relation Age of Onset  . Hypertension Mother     Social History   Socioeconomic History  . Marital status: Single    Spouse name: Not on file  . Number of children: Not on file  . Years of education: Not on file  . Highest education level: Not on file  Occupational History  . Not on file  Tobacco Use  . Smoking status: Never Smoker  . Smokeless tobacco: Never Used  Vaping Use  . Vaping Use: Never used  Substance and Sexual Activity  . Alcohol use: No  . Drug use: No  . Sexual activity: Not Currently  Other Topics Concern  . Not on file  Social History Narrative  . Not on file   Social Determinants of Health   Financial Resource Strain: Not  on file  Food Insecurity: Not on file  Transportation Needs: Not on file  Physical Activity: Not on file  Stress: Not on file  Social Connections: Not on file  Intimate Partner Violence: Not on file    Review of Systems  All other systems reviewed and are negative.   PHYSICAL EXAMINATION:    BP 122/68   Pulse (!) 107   Ht 5\' 6"  (1.676 m)   Wt 209 lb (94.8 kg)   LMP 08/01/2020   SpO2 100%   BMI 33.73 kg/m     General appearance: alert, cooperative and appears stated age   89. Menorrhagia with regular cycle, not helped with OCP's. Myoma not amenable to hysteroscopic resection. She desires to retain her fertility. Discussed multiple options for treatment, including: mirena IUD, lysteda (not with OCP's), daily progesterone, Oriahnn (x24 months), Myfembree (x 24 months), Sonata fibroid ablation and myomectomy.  Discussed that uterine artery embolization is not typically recommended in  women who wish to retain fertility.  Discussed risks and benefits.  After discussion she would like to try the mirena IUD Will stop OCP's Will use Lysteda with her next cycle and plan IUD insertion at the end or right after her cycle.   2. History of anemia Severe, needing iron transfusions. At risk of needing emergency intervention unless we get her menorrhagia under control.  3. Uterine leiomyoma, unspecified location Not amenable to hysteroscopic resection   Not sexually active, discussed contraception benefits of the IUD.   Over 30 minutes in total patient care.

## 2020-08-15 ENCOUNTER — Ambulatory Visit: Payer: 59 | Admitting: Obstetrics and Gynecology

## 2020-08-15 ENCOUNTER — Encounter: Payer: Self-pay | Admitting: Obstetrics and Gynecology

## 2020-08-15 ENCOUNTER — Other Ambulatory Visit: Payer: Self-pay

## 2020-08-15 ENCOUNTER — Other Ambulatory Visit: Payer: Self-pay | Admitting: Obstetrics and Gynecology

## 2020-08-15 VITALS — BP 122/68 | HR 107 | Ht 66.0 in | Wt 209.0 lb

## 2020-08-15 DIAGNOSIS — Z3009 Encounter for other general counseling and advice on contraception: Secondary | ICD-10-CM

## 2020-08-15 DIAGNOSIS — D259 Leiomyoma of uterus, unspecified: Secondary | ICD-10-CM | POA: Diagnosis not present

## 2020-08-15 DIAGNOSIS — N920 Excessive and frequent menstruation with regular cycle: Secondary | ICD-10-CM | POA: Diagnosis not present

## 2020-08-15 DIAGNOSIS — Z862 Personal history of diseases of the blood and blood-forming organs and certain disorders involving the immune mechanism: Secondary | ICD-10-CM | POA: Diagnosis not present

## 2020-08-15 MED ORDER — TRANEXAMIC ACID 650 MG PO TABS
1300.0000 mg | ORAL_TABLET | Freq: Three times a day (TID) | ORAL | 0 refills | Status: DC
Start: 2020-08-15 — End: 2020-08-15

## 2020-08-15 MED FILL — TRANEXAMIC ACID 650 MG TABS: 650 | 5 days supply | Qty: 30 | Fill #0

## 2020-08-17 ENCOUNTER — Ambulatory Visit: Payer: 59

## 2020-08-18 ENCOUNTER — Inpatient Hospital Stay: Payer: 59

## 2020-08-18 ENCOUNTER — Other Ambulatory Visit: Payer: Self-pay

## 2020-08-18 VITALS — BP 133/87 | HR 71 | Temp 98.1°F | Resp 16

## 2020-08-18 DIAGNOSIS — E559 Vitamin D deficiency, unspecified: Secondary | ICD-10-CM | POA: Diagnosis not present

## 2020-08-18 DIAGNOSIS — D509 Iron deficiency anemia, unspecified: Secondary | ICD-10-CM

## 2020-08-18 DIAGNOSIS — D508 Other iron deficiency anemias: Secondary | ICD-10-CM

## 2020-08-18 DIAGNOSIS — D75838 Other thrombocytosis: Secondary | ICD-10-CM | POA: Diagnosis not present

## 2020-08-18 MED ORDER — LORATADINE 10 MG PO TABS
10.0000 mg | ORAL_TABLET | Freq: Once | ORAL | Status: AC
  Administered 2020-08-18: 10 mg via ORAL

## 2020-08-18 MED ORDER — SODIUM CHLORIDE 0.9 % IV SOLN
Freq: Once | INTRAVENOUS | Status: AC
  Filled 2020-08-18: qty 250

## 2020-08-18 MED ORDER — SODIUM CHLORIDE 0.9 % IV SOLN
300.0000 mg | Freq: Once | INTRAVENOUS | Status: AC
  Administered 2020-08-18: 300 mg via INTRAVENOUS
  Filled 2020-08-18: qty 10

## 2020-08-18 MED ORDER — LORATADINE 10 MG PO TABS
ORAL_TABLET | ORAL | Status: AC
  Filled 2020-08-18: qty 1

## 2020-08-18 MED ORDER — ACETAMINOPHEN 325 MG PO TABS
ORAL_TABLET | ORAL | Status: AC
  Filled 2020-08-18: qty 2

## 2020-08-18 MED ORDER — ACETAMINOPHEN 325 MG PO TABS
650.0000 mg | ORAL_TABLET | Freq: Once | ORAL | Status: AC
  Administered 2020-08-18: 650 mg via ORAL

## 2020-08-18 NOTE — Patient Instructions (Signed)

## 2020-09-06 ENCOUNTER — Ambulatory Visit (INDEPENDENT_AMBULATORY_CARE_PROVIDER_SITE_OTHER): Payer: 59 | Admitting: Obstetrics and Gynecology

## 2020-09-06 ENCOUNTER — Other Ambulatory Visit: Payer: Self-pay

## 2020-09-06 ENCOUNTER — Encounter: Payer: Self-pay | Admitting: Obstetrics and Gynecology

## 2020-09-06 VITALS — BP 134/82 | HR 84 | Ht 65.0 in | Wt 209.0 lb

## 2020-09-06 DIAGNOSIS — Z30430 Encounter for insertion of intrauterine contraceptive device: Secondary | ICD-10-CM | POA: Diagnosis not present

## 2020-09-06 DIAGNOSIS — N920 Excessive and frequent menstruation with regular cycle: Secondary | ICD-10-CM

## 2020-09-06 DIAGNOSIS — Z01812 Encounter for preprocedural laboratory examination: Secondary | ICD-10-CM | POA: Diagnosis not present

## 2020-09-06 DIAGNOSIS — Z3009 Encounter for other general counseling and advice on contraception: Secondary | ICD-10-CM

## 2020-09-06 DIAGNOSIS — Z862 Personal history of diseases of the blood and blood-forming organs and certain disorders involving the immune mechanism: Secondary | ICD-10-CM

## 2020-09-06 DIAGNOSIS — D259 Leiomyoma of uterus, unspecified: Secondary | ICD-10-CM

## 2020-09-06 LAB — PREGNANCY, URINE: Preg Test, Ur: NEGATIVE

## 2020-09-06 NOTE — Progress Notes (Signed)
GYNECOLOGY  VISIT   HPI: 35 y.o.   Single Black or African American Hispanic or Latino  female   [name] with Patient's last menstrual period was 08/28/2020.   here for IUD insertion. The patient has a fibroid uterus with resultant menorrhagia and severe anemia. No help with OCP's.   GYNECOLOGIC HISTORY: Patient's last menstrual period was 08/28/2020. Contraception: none  Menopausal hormone therapy:  None         OB History    Gravida  0   Para  0   Term  0   Preterm  0   AB  0   Living  0     SAB  0   IAB  0   Ectopic  0   Multiple  0   Live Births  0              Patient Active Problem List   Diagnosis Date Noted  . Iron deficiency anemia 10/25/2015  . 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 Anemia     History reviewed. No pertinent surgical history.  Current Outpatient Medications  Medication Sig Dispense Refill  . iron polysaccharides (NIFEREX) 150 MG capsule Take 1 capsule (150 mg total) by mouth 2 (two) times daily. 60 capsule 5  . Multiple Vitamins-Minerals (MULTIVITAMIN) tablet Take 1 tablet by mouth daily.    . tranexamic acid (LYSTEDA) 650 MG TABS tablet Take 2 tablets (1,300 mg total) by mouth 3 (three) times daily. Can use for up to 5 days a month. 30 tablet 0   No current facility-administered medications for this visit.     ALLERGIES: Bee venom and Penicillins  Family History  Problem Relation Age of Onset  . Hypertension Mother     Social History   Socioeconomic History  . Marital status: Single    Spouse name: Not on file  . Number of children: Not on file  . Years of education: Not on file  . Highest education level: Not on file  Occupational History  . Not on file  Tobacco Use  . Smoking status: Never Smoker  . Smokeless tobacco: Never Used  Vaping Use  . Vaping Use: Never used  Substance and Sexual Activity  . Alcohol use: No  .  Drug use: No  . Sexual activity: Not Currently  Other Topics Concern  . Not on file  Social History Narrative  . Not on file   Social Determinants of Health   Financial Resource Strain: Not on file  Food Insecurity: Not on file  Transportation Needs: Not on file  Physical Activity: Not on file  Stress: Not on file  Social Connections: Not on file  Intimate Partner Violence: Not on file    Review of Systems  All other systems reviewed and are negative.   PHYSICAL EXAMINATION:    BP 134/82   Pulse 84   Ht 5\' 5"  (1.651 m)   Wt 209 lb (94.8 kg)   LMP 08/28/2020   SpO2 99%   BMI 34.78 kg/m     General appearance: alert, cooperative and appears stated age  Pelvic: External genitalia:  no lesions              Urethra:  normal appearing urethra with no masses, tenderness or lesions              Bartholins and Skenes: normal  Vagina: normal appearing vagina with normal color and discharge, no lesions              Cervix: no lesions   The risks of the mirena IUD were reviewed with the patient, including infection, abnormal bleeding and uterine perfortion. Consent was signed.  A speculum was placed in the vagina, the cervix was cleansed with betadine. A tenaculum was placed on the cervix, the uterus sounded to 8-9 cm. The cervix was dilated to a 5 hagar dilator  The mirena IUD was inserted without difficulty. The string were cut to 3 cm.    The patient tolerated the procedure well.      1. Encounter for IUD insertion Mirena IUD inserted F/U in one month  2. Menorrhagia with regular cycle From fibroid uterus - IUD Insertion  3. History of anemia She has been dangerously anemic. - IUD Insertion  4. Uterine leiomyoma, unspecified location  - IUD Insertion  5. General counseling and advice on female contraception  - IUD Insertion  6. Pre-procedure lab exam  - Pregnancy, urine

## 2020-09-06 NOTE — Patient Instructions (Signed)

## 2020-09-28 ENCOUNTER — Other Ambulatory Visit (HOSPITAL_COMMUNITY): Payer: Self-pay

## 2020-09-28 MED FILL — Polysaccharide Iron Complex Cap 150 MG (Iron Equivalent): ORAL | 30 days supply | Qty: 60 | Fill #0 | Status: AC

## 2020-10-06 DIAGNOSIS — E785 Hyperlipidemia, unspecified: Secondary | ICD-10-CM | POA: Diagnosis not present

## 2020-10-06 DIAGNOSIS — D72819 Decreased white blood cell count, unspecified: Secondary | ICD-10-CM | POA: Diagnosis not present

## 2020-10-06 DIAGNOSIS — R7303 Prediabetes: Secondary | ICD-10-CM | POA: Diagnosis not present

## 2020-10-06 DIAGNOSIS — D649 Anemia, unspecified: Secondary | ICD-10-CM | POA: Diagnosis not present

## 2020-10-06 DIAGNOSIS — E559 Vitamin D deficiency, unspecified: Secondary | ICD-10-CM | POA: Diagnosis not present

## 2020-10-06 DIAGNOSIS — E669 Obesity, unspecified: Secondary | ICD-10-CM | POA: Diagnosis not present

## 2020-10-10 ENCOUNTER — Encounter: Payer: Self-pay | Admitting: Obstetrics and Gynecology

## 2020-10-10 ENCOUNTER — Ambulatory Visit: Payer: 59 | Admitting: Obstetrics and Gynecology

## 2020-10-10 ENCOUNTER — Other Ambulatory Visit: Payer: Self-pay

## 2020-10-10 VITALS — BP 130/84 | HR 89 | Ht 65.0 in | Wt 215.0 lb

## 2020-10-10 DIAGNOSIS — Z8742 Personal history of other diseases of the female genital tract: Secondary | ICD-10-CM

## 2020-10-10 DIAGNOSIS — Z30431 Encounter for routine checking of intrauterine contraceptive device: Secondary | ICD-10-CM | POA: Diagnosis not present

## 2020-10-10 DIAGNOSIS — D500 Iron deficiency anemia secondary to blood loss (chronic): Secondary | ICD-10-CM | POA: Diagnosis not present

## 2020-10-10 DIAGNOSIS — T8332XA Displacement of intrauterine contraceptive device, initial encounter: Secondary | ICD-10-CM

## 2020-10-10 NOTE — Progress Notes (Signed)
GYNECOLOGY  VISIT   HPI: 35 y.o.   Single Black or African American Hispanic or Latino  female   [name] with Patient's last menstrual period was 09/25/2020.   here for IUD follow up. Patient states that her bleeding was less with her last period.   She had a mirena IUD inserted last month. H/O fibroid uterus with menorrhagia and anemia (hgb down to 5.4). Last hgb was 9.8 in 2/22 after iron transfusion. Her first cycle since IUD insertion was last week. It was much better. She was changing her pad in up to 3 hours (improved from 30 minutes).  She had blood work with her primary last week. Her last really heavy cycle was in 2/22.  The patient was able to pull her labs up on her phone. Labs from 10/07/20 reviewed: Hgb 10.7 HgbA1C 6.1% TSH 0.98  GYNECOLOGIC HISTORY: Patient's last menstrual period was 09/25/2020. Contraception: IUD  Menopausal hormone therapy: none         OB History    Gravida  0   Para  0   Term  0   Preterm  0   AB  0   Living  0     SAB  0   IAB  0   Ectopic  0   Multiple  0   Live Births  0              Patient Active Problem List   Diagnosis Date Noted  . Iron deficiency anemia 10/25/2015  . 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 Anemia     No past surgical history on file.  Current Outpatient Medications  Medication Sig Dispense Refill  . Elagolix Sodium 200 MG TABS TAKE 1 TABLET BY MOUTH 2 TIMES DAILY 60 tablet 3  . iron polysaccharides (NIFEREX) 150 MG capsule TAKE 1 CAPSULE BY MOUTH TWO TIMES DAILY 60 capsule 5  . Multiple Vitamins-Minerals (MULTIVITAMIN) tablet Take 1 tablet by mouth daily.    . tranexamic acid (LYSTEDA) 650 MG TABS tablet TAKE 2 TABLETS BY MOUTH 3 TIMES DAILY. * CAN USE FOR UP TO 5 DAYS A MONTH 30 tablet 0   No current facility-administered medications for this visit.     ALLERGIES: Bee venom and Penicillins  Family  History  Problem Relation Age of Onset  . Hypertension Mother     Social History   Socioeconomic History  . Marital status: Single    Spouse name: Not on file  . Number of children: Not on file  . Years of education: Not on file  . Highest education level: Not on file  Occupational History  . Not on file  Tobacco Use  . Smoking status: Never Smoker  . Smokeless tobacco: Never Used  Vaping Use  . Vaping Use: Never used  Substance and Sexual Activity  . Alcohol use: No  . Drug use: No  . Sexual activity: Not Currently  Other Topics Concern  . Not on file  Social History Narrative  . Not on file   Social Determinants of Health   Financial Resource Strain: Not on file  Food Insecurity: Not on file  Transportation Needs: Not on file  Physical Activity: Not on file  Stress: Not on file  Social Connections: Not on file  Intimate Partner Violence: Not on file    Review of Systems  All other systems reviewed and are negative.   PHYSICAL EXAMINATION:  BP 130/84   Pulse 89   Ht 5\' 5"  (1.651 m)   Wt 215 lb (97.5 kg)   LMP 09/25/2020   SpO2 99%   BMI 35.78 kg/m     General appearance: alert, cooperative and appears stated age  Pelvic: External genitalia:  no lesions              Urethra:  normal appearing urethra with no masses, tenderness or lesions              Bartholins and Skenes: normal                 Vagina: normal appearing vagina with normal color and discharge, no lesions              Cervix: no lesions and IUD string mising. Unable to tease IUD string out of the cervix              Bimanual Exam:  Uterus:  anteverted, mobile, not tender, not appreciably enlarged.               Adnexa: no mass, fullness, tenderness               Chaperone was present for exam.  1. IUD check up Doing well, first cycle with the IUD was much lighter. Hgb has improved (see above, labs reviewed).   2. Intrauterine contraceptive device threads lost, initial encounter -  US PELVIS TRANSVAGINAL NON-OB (TV ONLY); Future  3. History of menorrhagia Improved  4. Iron deficiency anemia due to chronic blood loss Improved

## 2020-11-10 ENCOUNTER — Encounter: Payer: Self-pay | Admitting: Obstetrics and Gynecology

## 2020-11-10 ENCOUNTER — Ambulatory Visit: Payer: 59 | Admitting: Obstetrics and Gynecology

## 2020-11-10 ENCOUNTER — Ambulatory Visit (INDEPENDENT_AMBULATORY_CARE_PROVIDER_SITE_OTHER): Payer: 59

## 2020-11-10 ENCOUNTER — Other Ambulatory Visit: Payer: Self-pay

## 2020-11-10 ENCOUNTER — Telehealth: Payer: Self-pay | Admitting: *Deleted

## 2020-11-10 VITALS — BP 136/82 | HR 77

## 2020-11-10 DIAGNOSIS — T8332XD Displacement of intrauterine contraceptive device, subsequent encounter: Secondary | ICD-10-CM

## 2020-11-10 DIAGNOSIS — T8332XA Displacement of intrauterine contraceptive device, initial encounter: Secondary | ICD-10-CM

## 2020-11-10 DIAGNOSIS — D259 Leiomyoma of uterus, unspecified: Secondary | ICD-10-CM | POA: Diagnosis not present

## 2020-11-10 NOTE — Telephone Encounter (Signed)
-----   Message from Salvadore Dom, MD sent at 11/10/2020  3:04 PM EDT ----- This patient has a missing mirena IUD and needs a KUB at [facility].  Thanks, Sharee Pimple

## 2020-11-10 NOTE — Progress Notes (Signed)
GYNECOLOGY  VISIT   HPI: 35 y.o.   Single Black or African American Hispanic or Latino  female   [name] with No LMP recorded.   here for an ultrasound to check IUD location.  She has a h/o a fibroid uterus with menorrhagia and severe anemia. No help with OCP's. She had a mirena IUD inserted in 3/22, strings not seen at her f/u visit in 4/22.  Her bleeding has been better since the IUD was inserted. She hasn't noticed expulsing it. Since IUD insertion she had a cycle on 4/3 x 7 days. Then a cycle on 4/26 x 7-8 days. She was bleeding lighter than prior to IUD insertion. Saturating a pad in ~3 hours at worst. The heavier bleeding only lasts for 2-3 days. Prior to the IUD she could saturate a pad every 30 minutes.   GYNECOLOGIC HISTORY: No LMP recorded. Contraception:IUD Menopausal hormone therapy: no        OB History    Gravida  0   Para  0   Term  0   Preterm  0   AB  0   Living  0     SAB  0   IAB  0   Ectopic  0   Multiple  0   Live Births  0              Patient Active Problem List   Diagnosis Date Noted  . Iron deficiency anemia 10/25/2015  . 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 Anemia     No past surgical history on file.  Current Outpatient Medications  Medication Sig Dispense Refill  . Elagolix Sodium 200 MG TABS TAKE 1 TABLET BY MOUTH 2 TIMES DAILY 60 tablet 3  . iron polysaccharides (NIFEREX) 150 MG capsule TAKE 1 CAPSULE BY MOUTH TWO TIMES DAILY 60 capsule 5  . Multiple Vitamins-Minerals (MULTIVITAMIN) tablet Take 1 tablet by mouth daily.    . tranexamic acid (LYSTEDA) 650 MG TABS tablet TAKE 2 TABLETS BY MOUTH 3 TIMES DAILY. * CAN USE FOR UP TO 5 DAYS A MONTH 30 tablet 0   No current facility-administered medications for this visit.     ALLERGIES: Bee venom and Penicillins  Family History  Problem Relation Age of Onset  . Hypertension Mother      Social History   Socioeconomic History  . Marital status: Single    Spouse name: Not on file  . Number of children: Not on file  . Years of education: Not on file  . Highest education level: Not on file  Occupational History  . Not on file  Tobacco Use  . Smoking status: Never Smoker  . Smokeless tobacco: Never Used  Vaping Use  . Vaping Use: Never used  Substance and Sexual Activity  . Alcohol use: No  . Drug use: No  . Sexual activity: Not Currently  Other Topics Concern  . Not on file  Social History Narrative  . Not on file   Social Determinants of Health   Financial Resource Strain: Not on file  Food Insecurity: Not on file  Transportation Needs: Not on file  Physical Activity: Not on file  Stress: Not on file  Social Connections: Not on file  Intimate Partner Violence: Not on file    ROS  PHYSICAL EXAMINATION:    There were no vitals taken for this visit.    General appearance: alert, cooperative  and appears stated age  Pelvic ultrasound  Indications: IUD string missing  Findings:  Uterus 12.19 x 8.33 x 8.03 cm  Fibroids:   1) 3.34 x 2.48 cm   2) 3.30 x 2.87 cm   3) 5.81 x 4.62 cm   4) 2.40 x 2.05 cm   5) 1.66 x 1.95 cm   6) 3.34 x 2.41 cm  Endometrium 6.10 mm  Left ovary 2.62 x 1.77 x 1.63 cm  Right ovary 3.14 x 1.95 x 1.79 cm  No free fluid   Impression:  Enlarged, anteverted, fibroid uterus.  Largest fibroid is 5.8 x 4.6 cm, intramural, other fibroids subserosal fibroid Unable to visualize IUD, difficult visualization d/t encroaching fibroids No endometrial masses or thickening seen Normal ovaries bilaterally  1. Intrauterine contraceptive device threads lost, subsequent encounter IUD not seen on ultrasound Needs KUB to determine if the IUD has been expulsed  2. Uterine leiomyoma, unspecified location

## 2020-11-13 ENCOUNTER — Encounter: Payer: Self-pay | Admitting: Obstetrics and Gynecology

## 2020-11-14 NOTE — Telephone Encounter (Signed)
Left message for patient to call, she has not read the below message in my chart.

## 2020-11-15 NOTE — Telephone Encounter (Signed)
Rosemarie Ax said patient called and left message at appointments voicemail. Rosemarie Ax called patient back and received her voicemail she left a detailed message on voicemail with the below.

## 2020-11-17 ENCOUNTER — Ambulatory Visit (HOSPITAL_COMMUNITY)
Admission: RE | Admit: 2020-11-17 | Discharge: 2020-11-17 | Disposition: A | Discharge status: Home / Self Care | Payer: 59 | Source: Ambulatory Visit | Attending: Obstetrics and Gynecology | Admitting: Obstetrics and Gynecology

## 2020-11-17 DIAGNOSIS — T8332XD Displacement of intrauterine contraceptive device, subsequent encounter: Secondary | ICD-10-CM | POA: Diagnosis not present

## 2020-11-17 DIAGNOSIS — T8332XA Displacement of intrauterine contraceptive device, initial encounter: Secondary | ICD-10-CM | POA: Diagnosis not present

## 2020-11-23 ENCOUNTER — Encounter: Payer: Self-pay | Admitting: Hematology

## 2020-11-23 ENCOUNTER — Other Ambulatory Visit: Payer: Self-pay | Admitting: Hematology

## 2020-11-23 ENCOUNTER — Other Ambulatory Visit (HOSPITAL_COMMUNITY): Payer: Self-pay

## 2020-11-23 MED ORDER — POLYSACCHARIDE IRON COMPLEX 150 MG PO CAPS
ORAL_CAPSULE | Freq: Two times a day (BID) | ORAL | 5 refills | Status: DC
  Filled 2020-11-23: qty 60, 30d supply, fill #0
  Filled 2021-01-31: qty 60, 30d supply, fill #1
  Filled 2021-03-10: qty 60, 30d supply, fill #2
  Filled 2021-05-29: qty 60, 30d supply, fill #3
  Filled 2021-08-07: qty 60, 30d supply, fill #4
  Filled 2021-10-11: qty 60, 30d supply, fill #5

## 2020-11-25 NOTE — Telephone Encounter (Signed)
Patient had done on 11/17/20

## 2020-11-30 ENCOUNTER — Other Ambulatory Visit (HOSPITAL_COMMUNITY): Payer: Self-pay

## 2021-01-10 ENCOUNTER — Telehealth: Payer: Self-pay

## 2021-01-10 NOTE — Telephone Encounter (Signed)
Per Dr. Talbert Nan result note "Please let the patient know that the xray does not show the IUD, she must haveexpulsed it during her cycle. She has a h/o a fibroid uterus severe menorrhagia leading to anemia. She hasn'thad help with OCP's. I prescribed lyseda for her to take prior to IUD insertion. Please check if she tried it and if it helped? She can take the lysteda, 2 tablets TID for up to 5 days of her cycle (I called in 30 tablets in 2/22). If she wants to try this we can call 90 tablets and have her f/u in 3 months. We have discussed other options for treatment. If she wants to review them again, please set her up foran appointment."  I spoke with patient. She said ever since IUD was inserted her periods have been normal and not heavy.  She does not feel like she needs to take the Lysteda. She did understand that the x-ray shows no IUD present.

## 2021-01-10 NOTE — Telephone Encounter (Signed)
-----   Message from Salvadore Dom, MD sent at 11/22/2020 11:40 AM EDT ----- Please see result note.

## 2021-01-26 DIAGNOSIS — E669 Obesity, unspecified: Secondary | ICD-10-CM | POA: Diagnosis not present

## 2021-01-26 DIAGNOSIS — R635 Abnormal weight gain: Secondary | ICD-10-CM | POA: Diagnosis not present

## 2021-01-26 DIAGNOSIS — E559 Vitamin D deficiency, unspecified: Secondary | ICD-10-CM | POA: Diagnosis not present

## 2021-01-26 DIAGNOSIS — Z0000 Encounter for general adult medical examination without abnormal findings: Secondary | ICD-10-CM | POA: Diagnosis not present

## 2021-01-26 DIAGNOSIS — E785 Hyperlipidemia, unspecified: Secondary | ICD-10-CM | POA: Diagnosis not present

## 2021-01-26 DIAGNOSIS — D72819 Decreased white blood cell count, unspecified: Secondary | ICD-10-CM | POA: Diagnosis not present

## 2021-01-26 DIAGNOSIS — R7303 Prediabetes: Secondary | ICD-10-CM | POA: Diagnosis not present

## 2021-01-26 DIAGNOSIS — D649 Anemia, unspecified: Secondary | ICD-10-CM | POA: Diagnosis not present

## 2021-01-30 ENCOUNTER — Other Ambulatory Visit (HOSPITAL_COMMUNITY): Payer: Self-pay

## 2021-01-30 DIAGNOSIS — R635 Abnormal weight gain: Secondary | ICD-10-CM | POA: Diagnosis not present

## 2021-01-30 DIAGNOSIS — D649 Anemia, unspecified: Secondary | ICD-10-CM | POA: Diagnosis not present

## 2021-01-30 DIAGNOSIS — E559 Vitamin D deficiency, unspecified: Secondary | ICD-10-CM | POA: Diagnosis not present

## 2021-01-30 DIAGNOSIS — E785 Hyperlipidemia, unspecified: Secondary | ICD-10-CM | POA: Diagnosis not present

## 2021-01-30 DIAGNOSIS — E119 Type 2 diabetes mellitus without complications: Secondary | ICD-10-CM | POA: Diagnosis not present

## 2021-01-30 DIAGNOSIS — D72819 Decreased white blood cell count, unspecified: Secondary | ICD-10-CM | POA: Diagnosis not present

## 2021-01-30 DIAGNOSIS — E669 Obesity, unspecified: Secondary | ICD-10-CM | POA: Diagnosis not present

## 2021-01-30 MED ORDER — METFORMIN HCL 500 MG PO TABS
ORAL_TABLET | ORAL | 0 refills | Status: DC
  Filled 2021-01-30: qty 60, 30d supply, fill #0

## 2021-01-31 ENCOUNTER — Other Ambulatory Visit (HOSPITAL_COMMUNITY): Payer: Self-pay

## 2021-01-31 ENCOUNTER — Encounter: Payer: Self-pay | Admitting: Hematology

## 2021-02-02 ENCOUNTER — Encounter: Payer: Self-pay | Admitting: Obstetrics and Gynecology

## 2021-03-10 ENCOUNTER — Other Ambulatory Visit (HOSPITAL_COMMUNITY): Payer: Self-pay

## 2021-03-10 ENCOUNTER — Encounter: Payer: Self-pay | Admitting: Hematology

## 2021-03-13 ENCOUNTER — Other Ambulatory Visit (HOSPITAL_COMMUNITY): Payer: Self-pay

## 2021-03-14 ENCOUNTER — Other Ambulatory Visit (HOSPITAL_COMMUNITY): Payer: Self-pay

## 2021-03-15 ENCOUNTER — Other Ambulatory Visit (HOSPITAL_COMMUNITY): Payer: Self-pay

## 2021-03-16 ENCOUNTER — Other Ambulatory Visit (HOSPITAL_COMMUNITY): Payer: Self-pay

## 2021-03-21 ENCOUNTER — Other Ambulatory Visit (HOSPITAL_COMMUNITY): Payer: Self-pay

## 2021-03-24 ENCOUNTER — Other Ambulatory Visit (HOSPITAL_COMMUNITY): Payer: Self-pay

## 2021-03-27 ENCOUNTER — Other Ambulatory Visit (HOSPITAL_COMMUNITY): Payer: Self-pay

## 2021-03-30 ENCOUNTER — Other Ambulatory Visit (HOSPITAL_COMMUNITY): Payer: Self-pay

## 2021-05-29 ENCOUNTER — Other Ambulatory Visit (HOSPITAL_COMMUNITY): Payer: Self-pay

## 2021-05-29 ENCOUNTER — Encounter: Payer: Self-pay | Admitting: Hematology

## 2021-06-20 NOTE — Progress Notes (Deleted)
35 y.o. Tonya Gilbert or African American Hispanic or Latino female here for annual exam.      No LMP recorded.          Sexually active: {yes no:314532}  The current method of family planning is {contraception:315051}.    Exercising: {yes no:314532}  {types:19826} Smoker:  {YES NO:22349}  Health Maintenance: Pap 2018 normal  History of abnormal Pap:  no MMG:  none  BMD:   none  Colonoscopy: none  TDaP:  unsure  Gardasil: patient thinks so    reports that she has never smoked. She has never used smokeless tobacco. She reports that she does not drink alcohol and does not use drugs.  Past Medical History:  Diagnosis Date   Anemia     No past surgical history on file.  Current Outpatient Medications  Medication Sig Dispense Refill   Elagolix Sodium 200 MG TABS TAKE 1 TABLET BY MOUTH 2 TIMES DAILY 60 tablet 3   iron polysaccharides (FERREX 150) 150 MG capsule TAKE 1 CAPSULE BY MOUTH TWO TIMES DAILY 60 capsule 5   metFORMIN (GLUCOPHAGE) 500 MG tablet Take 1 tablet by mouth twice a day 60 tablet 0   Multiple Vitamins-Minerals (MULTIVITAMIN) tablet Take 1 tablet by mouth daily.     tranexamic acid (LYSTEDA) 650 MG TABS tablet TAKE 2 TABLETS BY MOUTH 3 TIMES DAILY. * CAN USE FOR UP TO 5 DAYS A MONTH 30 tablet 0   No current facility-administered medications for this visit.    Family History  Problem Relation Age of Onset   Hypertension Mother     Review of Systems  Exam:   There were no vitals taken for this visit.  Weight change: @WEIGHTCHANGE @ Height:      Ht Readings from Last 3 Encounters:  10/10/20 5\' 5"  (1.651 m)  09/06/20 5\' 5"  (1.651 m)  08/15/20 5\' 6"  (1.676 m)    General appearance: alert, cooperative and appears stated age Head: Normocephalic, without obvious abnormality, atraumatic Neck: no adenopathy, supple, symmetrical, trachea midline and thyroid {CHL AMB PHY EX THYROID NORM [phone]::"normal to inspection and palpation"} Lungs: clear  to auscultation bilaterally Cardiovascular: regular rate and rhythm Breasts: {Exam; breast:13139::"normal appearance, no masses or tenderness"} Abdomen: soft, non-tender; non distended,  no masses,  no organomegaly Extremities: extremities normal, atraumatic, no cyanosis or edema Skin: Skin color, texture, turgor normal. No rashes or lesions Lymph nodes: Cervical, supraclavicular, and axillary nodes normal. No abnormal inguinal nodes palpated Neurologic: Grossly normal   Pelvic: External genitalia:  no lesions              Urethra:  normal appearing urethra with no masses, tenderness or lesions              Bartholins and Skenes: normal                 Vagina: normal appearing vagina with normal color and discharge, no lesions              Cervix: {CHL AMB PHY EX CERVIX NORM [phone]::"no lesions"}               Bimanual Exam:  Uterus:  {CHL AMB PHY EX UTERUS NORM DEFAULT:[phone]::"normal size, contour, position, consistency, mobility, non-tender"}              Adnexa: {CHL AMB PHY EX ADNEXA NO MASS [phone]::"no mass, fullness, tenderness"}               Rectovaginal: Confirms  Anus:  normal sphincter tone, no lesions  *** chaperoned for the exam.  A:  Well Woman with normal exam  P:

## 2021-06-22 ENCOUNTER — Ambulatory Visit: Payer: 59 | Admitting: Obstetrics and Gynecology

## 2021-07-31 DIAGNOSIS — R635 Abnormal weight gain: Secondary | ICD-10-CM | POA: Diagnosis not present

## 2021-07-31 DIAGNOSIS — E559 Vitamin D deficiency, unspecified: Secondary | ICD-10-CM | POA: Diagnosis not present

## 2021-07-31 DIAGNOSIS — E669 Obesity, unspecified: Secondary | ICD-10-CM | POA: Diagnosis not present

## 2021-07-31 DIAGNOSIS — E119 Type 2 diabetes mellitus without complications: Secondary | ICD-10-CM | POA: Diagnosis not present

## 2021-07-31 DIAGNOSIS — E785 Hyperlipidemia, unspecified: Secondary | ICD-10-CM | POA: Diagnosis not present

## 2021-07-31 DIAGNOSIS — D72819 Decreased white blood cell count, unspecified: Secondary | ICD-10-CM | POA: Diagnosis not present

## 2021-07-31 DIAGNOSIS — D649 Anemia, unspecified: Secondary | ICD-10-CM | POA: Diagnosis not present

## 2021-08-02 ENCOUNTER — Other Ambulatory Visit (HOSPITAL_COMMUNITY): Payer: Self-pay

## 2021-08-03 ENCOUNTER — Other Ambulatory Visit (HOSPITAL_COMMUNITY): Payer: Self-pay

## 2021-08-05 ENCOUNTER — Other Ambulatory Visit (HOSPITAL_COMMUNITY): Payer: Self-pay

## 2021-08-07 ENCOUNTER — Other Ambulatory Visit (HOSPITAL_COMMUNITY): Payer: Self-pay

## 2021-08-07 ENCOUNTER — Encounter: Payer: Self-pay | Admitting: Hematology

## 2021-08-07 MED ORDER — METFORMIN HCL 500 MG PO TABS
ORAL_TABLET | ORAL | 2 refills | Status: DC
  Filled 2021-08-07: qty 60, 30d supply, fill #0
  Filled 2021-11-29: qty 60, 30d supply, fill #1

## 2021-08-09 IMAGING — DX DG ABDOMEN 1V
2 series · 2 of 2 positions shown · non-contrast
Comparison: None.

CLINICAL DATA: 34-year-old female with missing contraceptive
device.

EXAM:
ABDOMEN - 1 VIEW

[abdomen kub (1 of 2)]
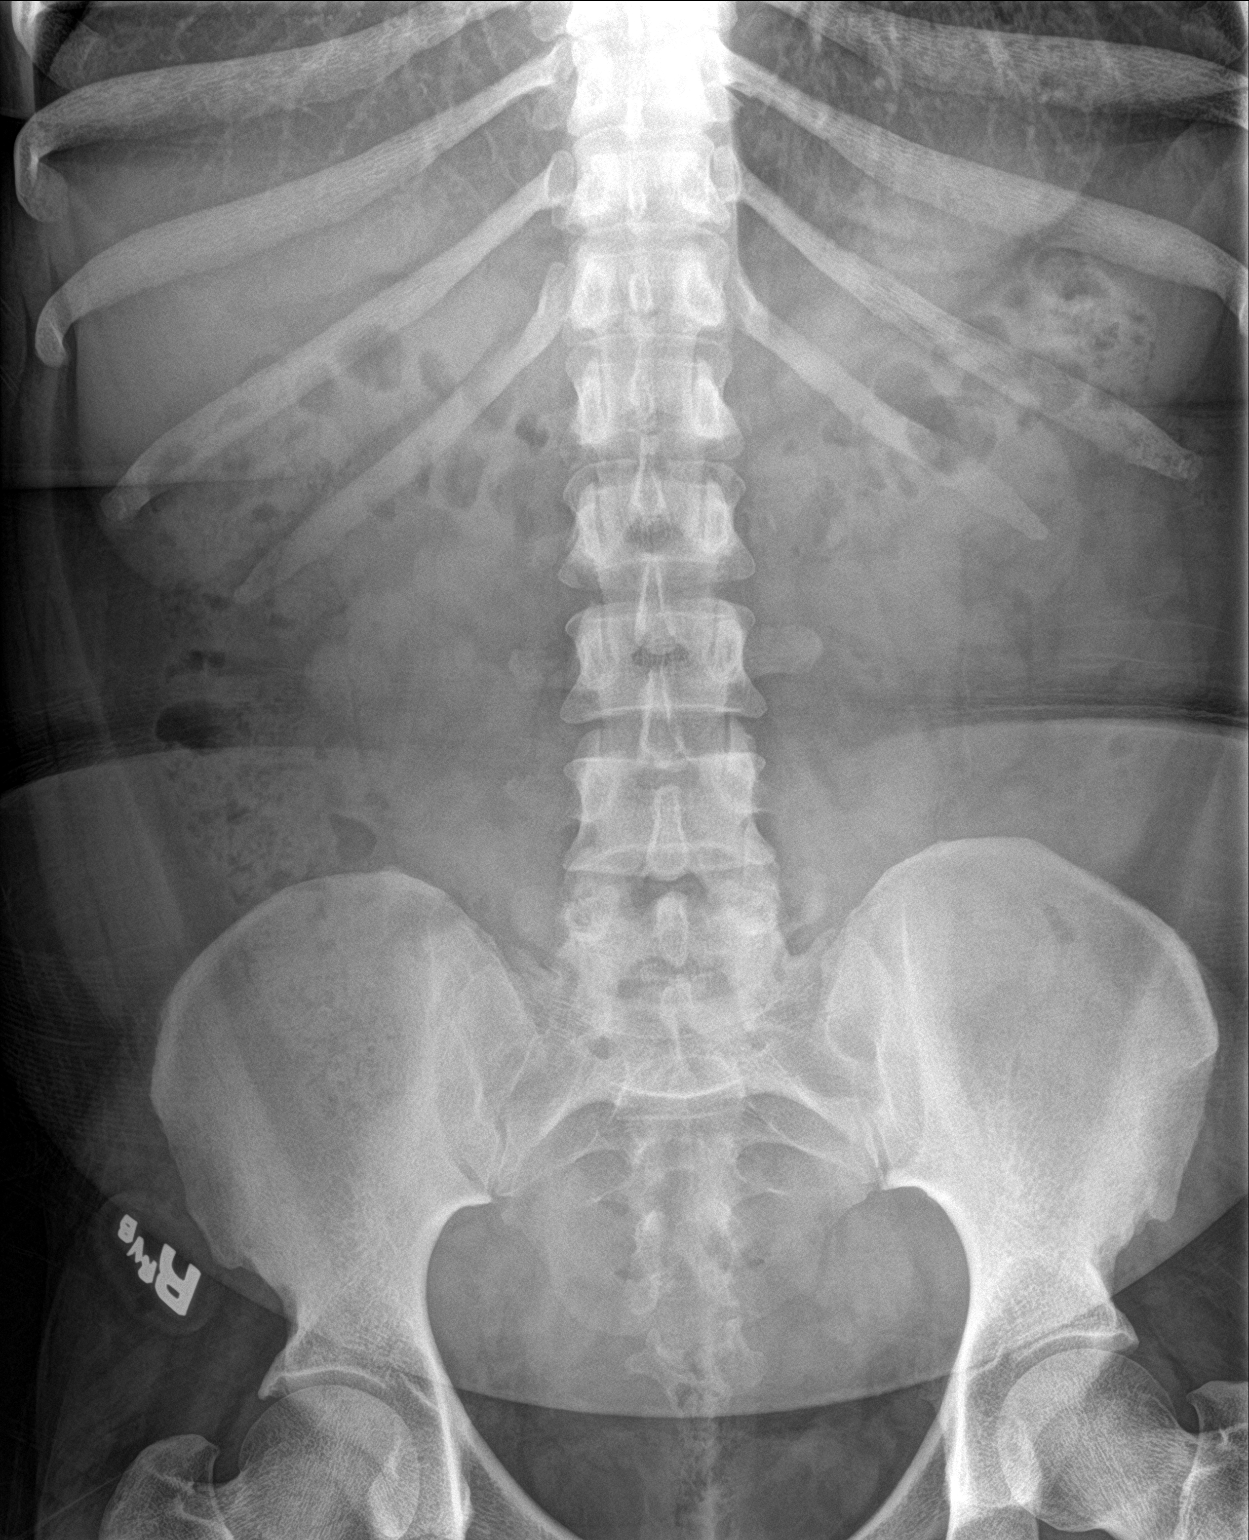

[abdomen kub (2 of 2)]
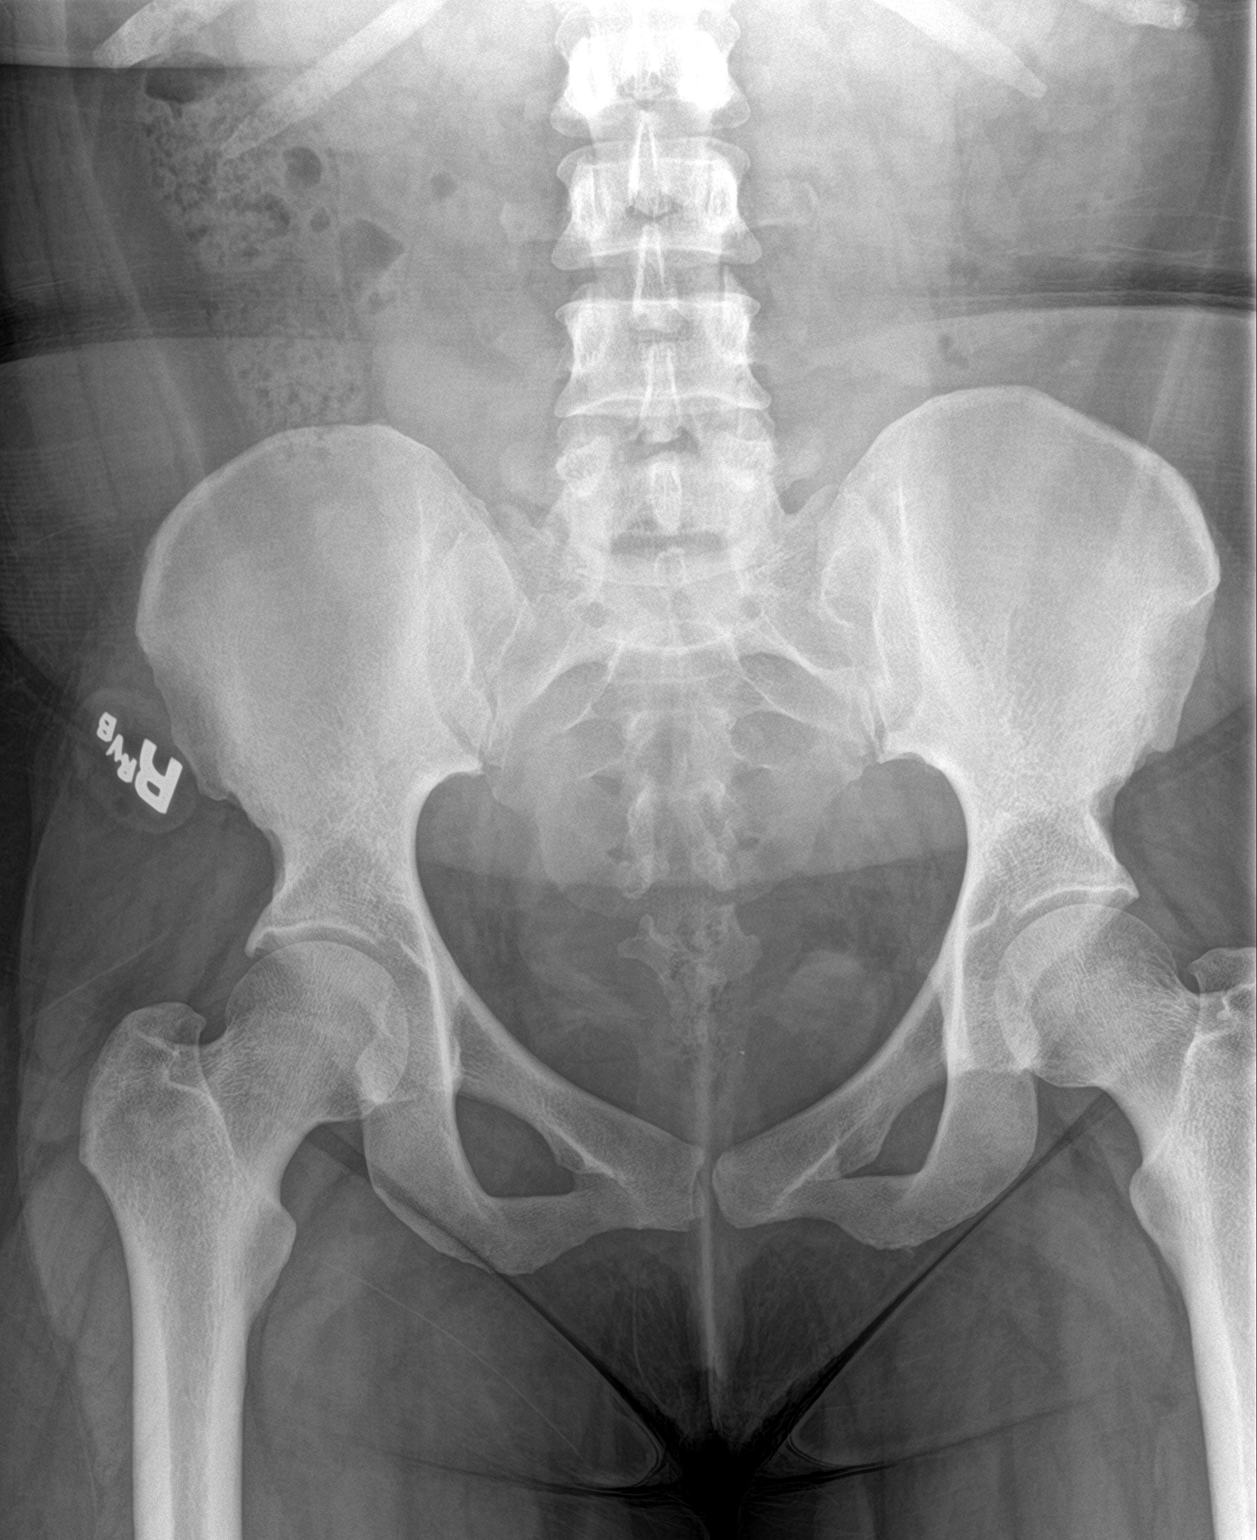

[2 of 2 positions shown; findings below may reference images not displayed]

FINDINGS: No intrauterine device identified.

There is no bowel dilatation or evidence of obstruction. No free air
or radiopaque calculi. The osseous structures and soft tissues are
grossly unremarkable.
IMPRESSION: No intrauterine device identified.

## 2021-08-16 ENCOUNTER — Other Ambulatory Visit (HOSPITAL_COMMUNITY): Payer: Self-pay

## 2021-08-29 DIAGNOSIS — E119 Type 2 diabetes mellitus without complications: Secondary | ICD-10-CM | POA: Diagnosis not present

## 2021-08-29 DIAGNOSIS — D72819 Decreased white blood cell count, unspecified: Secondary | ICD-10-CM | POA: Diagnosis not present

## 2021-08-29 DIAGNOSIS — E785 Hyperlipidemia, unspecified: Secondary | ICD-10-CM | POA: Diagnosis not present

## 2021-08-29 DIAGNOSIS — E559 Vitamin D deficiency, unspecified: Secondary | ICD-10-CM | POA: Diagnosis not present

## 2021-08-29 DIAGNOSIS — R635 Abnormal weight gain: Secondary | ICD-10-CM | POA: Diagnosis not present

## 2021-08-29 DIAGNOSIS — D649 Anemia, unspecified: Secondary | ICD-10-CM | POA: Diagnosis not present

## 2021-08-29 DIAGNOSIS — E669 Obesity, unspecified: Secondary | ICD-10-CM | POA: Diagnosis not present

## 2021-08-30 ENCOUNTER — Telehealth: Payer: Self-pay | Admitting: Hematology

## 2021-08-30 NOTE — Telephone Encounter (Signed)
Scheduled appt per 3/8 referral. Pt is already established with Dr. Irene Limbo. Called pt, no answer. Left msg with appt date and time.  ?

## 2021-09-07 ENCOUNTER — Other Ambulatory Visit: Payer: Self-pay

## 2021-09-08 ENCOUNTER — Inpatient Hospital Stay: Payer: 59 | Attending: Hematology | Admitting: Hematology

## 2021-09-08 ENCOUNTER — Other Ambulatory Visit: Payer: Self-pay

## 2021-09-08 VITALS — BP 132/88 | HR 90 | Temp 97.2°F | Wt 186.4 lb

## 2021-09-08 DIAGNOSIS — D72819 Decreased white blood cell count, unspecified: Secondary | ICD-10-CM | POA: Insufficient documentation

## 2021-09-08 DIAGNOSIS — D509 Iron deficiency anemia, unspecified: Secondary | ICD-10-CM | POA: Insufficient documentation

## 2021-09-08 DIAGNOSIS — D500 Iron deficiency anemia secondary to blood loss (chronic): Secondary | ICD-10-CM

## 2021-09-14 ENCOUNTER — Encounter: Payer: Self-pay | Admitting: Hematology

## 2021-09-14 NOTE — Progress Notes (Signed)
. ? ? ? ?HEMATOLOGY/ONCOLOGY CLINIC NOTE ? ?Date of Service: .09/08/2021 ? ?PCP: Doristine Section Bonsu MD ? ?CHIEF COMPLAINTS/PURPOSE OF CONSULTATION:  ? ?Follow-up for continued evaluation and management of iron deficiency anemia ?HISTORY OF PRESENTING ILLNESS:  ? ?Please see previous note for details on initial presentation ?Interval History:  ? ?Tonya Gilbert is here for follow-up and continued management of her iron deficiency anemia.  Her last clinic visit was about 1 year ago.  She notes that she has been following up with her OB/GYN doctor and did have a Mirena IUD placed which reduced her menstrual losses.  Her Mirena IUD seems to have fallen off but she reports that her menses are still less heavy than previously. ? ?She notes some mild fatigue but no other acute new symptoms. ?She reports that she is taking ferrous sulfate couple of times a day on and off. ? ?Recent labs with her primary care physician on 08/01/2021 showed a ferritin of 3 with an iron saturation of 4%. ?BMP shows normal creatinine of 0.71 , blood sugar 248. ?CBC shows hemoglobin of 8.4 with an MCV of 70.7, WBC count of 3.5k and platelets of 298k. ?Hemoglobin A1c of 12.7 suggesting poorly controlled diabetes. ? ?Patient notes no other evidence of bleeding.  No GI bleeding, no hematuria no nosebleeds no gum bleeds. ? ?MEDICAL HISTORY:  ? ?#1 obesity ?#2 iron deficiency anemia ?#3 hypercholesterolemia not on medications  ?#4 obesity - on phentermine for weight loss ?#5 history of vitamin D deficiency ?#6 diabetes ? ?SURGICAL HISTORY: ?No previous surgeries ? ?SOCIAL HISTORY: ?Social History  ? ?Socioeconomic History  ? Marital status: Single  ?  Spouse name: Not on file  ? Number of children: Not on file  ? Years of education: Not on file  ? Highest education level: Not on file  ?Occupational History  ? Not on file  ?Tobacco Use  ? Smoking status: Never  ? Smokeless tobacco: Never  ?Vaping Use  ? Vaping Use: Never used  ?Substance and Sexual  Activity  ? Alcohol use: No  ? Drug use: No  ? Sexual activity: Not Currently  ?Other Topics Concern  ? Not on file  ?Social History Narrative  ? Not on file  ? ?Social Determinants of Health  ? ?Financial Resource Strain: Not on file  ?Food Insecurity: Not on file  ?Transportation Needs: Not on file  ?Physical Activity: Not on file  ?Stress: Not on file  ?Social Connections: Not on file  ?Intimate Partner Violence: Not on file  ? ? ?FAMILY HISTORY: ?No family history of blood disorders or cancer that she is aware of. ? ?ALLERGIES:  is allergic to bee venom and penicillins. ? ?MEDICATIONS:  ?Current Outpatient Medications  ?Medication Sig Dispense Refill  ? iron polysaccharides (FERREX 150) 150 MG capsule TAKE 1 CAPSULE BY MOUTH TWO TIMES DAILY 60 capsule 5  ? metFORMIN (GLUCOPHAGE) 500 MG tablet Take 1 tablet by mouth twice a day 60 tablet 0  ? metFORMIN (GLUCOPHAGE) 500 MG tablet Take 1 tablet by mouth 2 times a day for 30 day(s) 60 tablet 2  ? Multiple Vitamins-Minerals (MULTIVITAMIN) tablet Take 1 tablet by mouth daily.    ? ?No current facility-administered medications for this visit.  ? ? ?REVIEW OF SYSTEMS:   ?10 Point review of Systems was done is negative except as noted above. ? ?PHYSICAL EXAMINATION: ?ECOG PERFORMANCE STATUS: 1 - Symptomatic but completely ambulatory ? ?. ?Vitals:  ? 09/08/21 1320  ?BP: 132/88  ?Pulse:  90  ?Temp: (!) 97.2 ?F (36.2 ?C)  ?SpO2: 100%  ? ?Filed Weights  ? 09/08/21 1320  ?Weight: 186 lb 6.4 oz (84.6 kg)  ? ?.Body mass index is 31.02 kg/m?[name]  ?NAD ?GENERAL:alert, in no acute distress and comfortable ?SKIN: no acute rashes, no significant lesions ?EYES: conjunctiva are pink and non-injected, sclera anicteric ?OROPHARYNX: MMM, no exudates, no oropharyngeal erythema or ulceration ?NECK: supple, no JVD ?LYMPH:  no palpable lymphadenopathy in the cervical, axillary or inguinal regions ?LUNGS: clear to auscultation b/l with normal respiratory effort ?HEART: regular rate &  rhythm ?ABDOMEN:  normoactive bowel sounds , non tender, not distended. ?Extremity: no pedal edema ?PSYCH: alert & oriented x 3 with fluent speech ?NEURO: no focal motor/sensory deficits ? ?LABORATORY DATA:  ?I have reviewed the data as listed ? ?. ? ?  Latest Ref Rng & Units 08/10/2020  ?  2:26 PM 08/03/2020  ? 10:58 AM 08/03/2020  ? 10:09 AM  ?CBC  ?WBC 4.0 - 10.5 K/uL 6.4   5.7   6.0    ?Hemoglobin 12.0 - 15.0 g/dL 9.8   5.4   5.6    ?Hematocrit 36.0 - 46.0 % 33.5   20.0   20.4    ?Platelets 150 - 400 K/uL 426   276   286    ? ?. ?CBC ?   ?Component Value Date/Time  ? WBC 6.4 08/10/2020 1426  ? WBC 5.7 08/03/2020 1058  ? RBC 4.25 08/10/2020 1426  ? HGB 9.8 (L) 08/10/2020 1426  ? HGB 8.6 (L) 05/12/2020 1005  ? HGB 12.5 10/25/2015 1608  ? HCT 33.5 (L) 08/10/2020 1426  ? HCT 27.3 (L) 05/12/2020 1005  ? HCT 38.2 10/25/2015 1608  ? PLT 426 (H) 08/10/2020 1426  ? PLT 262 05/12/2020 1005  ? MCV 78.8 (L) 08/10/2020 1426  ? MCV 82 05/12/2020 1005  ? MCV 81.3 10/25/2015 1608  ? MCH 23.1 (L) 08/10/2020 1426  ? MCHC 29.3 (L) 08/10/2020 1426  ? RDW 21.2 (H) 08/10/2020 1426  ? RDW 15.5 (H) 05/12/2020 1005  ? RDW 13.9 10/25/2015 1608  ? LYMPHSABS 2.2 08/10/2020 1426  ? LYMPHSABS 1.8 10/25/2015 1608  ? MONOABS 0.6 08/10/2020 1426  ? MONOABS 0.5 10/25/2015 1608  ? EOSABS 0.1 08/10/2020 1426  ? EOSABS 0.1 10/25/2015 1608  ? BASOSABS 0.0 08/10/2020 1426  ? BASOSABS 0.0 10/25/2015 1608  ? ? ? ?. ? ?  Latest Ref Rng & Units 02/01/2020  ?  1:25 PM 07/22/2018  ?  3:23 PM 10/25/2015  ?  4:08 PM  ?CMP  ?Glucose 70 - 99 mg/dL 144   97   94    ?BUN 6 - 20 mg/dL 7   11   14.8    ?Creatinine 0.44 - 1.00 mg/dL 0.88   0.93   1.2    ?Sodium 135 - 145 mmol/L 137   139   138    ?Potassium 3.5 - 5.1 mmol/L 3.5   3.9   3.9    ?Chloride 98 - 111 mmol/L 103   104     ?CO2 22 - 32 mmol/L '25   27   27    '$ ?Calcium 8.9 - 10.3 mg/dL 9.9   9.4   9.5    ?Total Protein 6.5 - 8.1 g/dL 7.4   7.7   7.7    ?Total Bilirubin 0.3 - 1.2 mg/dL <0.2   0.2   <0.30    ?Alkaline  Phos 38 - 126 U/L 48  42   51    ?AST 15 - 41 U/L '13   18   16    '$ ?ALT 0 - 44 U/L '12   15   15    '$ ? ?. ?Lab Results  ?Component Value Date  ? IRON 91 08/03/2020  ? TIBC 455 (H) 08/03/2020  ? IRONPCTSAT 20 (L) 08/03/2020  ? ?(Iron and TIBC) ? ?Lab Results  ?Component Value Date  ? FERRITIN <4 (L) 08/03/2020  ? ?02/20/18 Anemia Profile B: ? ? ? ?RADIOGRAPHIC STUDIES: ?I have personally reviewed the radiological images as listed and agreed with the findings in the report. ?No results found. ? ?ASSESSMENT & PLAN:  ? ?36 y.o. female from [place] with ? ?#1 Iron deficiency anemia ?Current iron deficiency anemia due to heavy menstrual losses. ? ?#2 mild leukopenia WBC count 3.5k ? ?PLAN: ?-Patient's recent labs from with her primary care physician on 08/01/2021 were reviewed in detail as noted above.  Hemoglobin of 8.4 with an MCV of 70 ferritin of 3 with iron saturation of 4% suggesting significant iron deficiency. ?-She is getting iron deficient despite taking oral iron. ?-I offered patient option for repeat IV iron replacement and patient is agreeable to this. ?-Continue follow-up with OB/GYN to manage menstrual losses and consider possible replacement of her Mirena IUD. ? ? ?FOLLOW UP: ?IV venofer weekly x 3 doses (scheduling per patient preference) ?Labs in 4 months , Phone visit 1 day after labs ? ?The total time spent in the appointment was 20 minutes*. ? ?All of the patient's questions were answered with apparent satisfaction. The patient knows to call the clinic with any problems, questions or concerns. ? ? ?Sullivan Lone MD MS AAHIVMS [facility] ?Hematology/Oncology Physician ?[place] ? ?.*Total Encounter Time as defined by the Centers for Medicare and Medicaid Services includes, in addition to the face-to-face time of a patient visit (documented in the note above) non-face-to-face time: obtaining and reviewing outside history, ordering and reviewing medications, tests or procedures, care  coordination (communications with other health care professionals or caregivers) and documentation in the medical record. ? ? ?

## 2021-09-20 ENCOUNTER — Other Ambulatory Visit: Payer: Self-pay

## 2021-09-20 ENCOUNTER — Inpatient Hospital Stay: Payer: 59

## 2021-09-20 VITALS — BP 166/95 | HR 99 | Temp 98.6°F | Resp 18

## 2021-09-20 DIAGNOSIS — D509 Iron deficiency anemia, unspecified: Secondary | ICD-10-CM

## 2021-09-20 DIAGNOSIS — D508 Other iron deficiency anemias: Secondary | ICD-10-CM

## 2021-09-20 DIAGNOSIS — D72819 Decreased white blood cell count, unspecified: Secondary | ICD-10-CM | POA: Diagnosis not present

## 2021-09-20 MED ORDER — SODIUM CHLORIDE 0.9 % IV SOLN: Freq: Once | INTRAVENOUS | Status: AC

## 2021-09-20 MED ORDER — LORATADINE 10 MG PO TABS
10.0000 mg | ORAL_TABLET | Freq: Once | ORAL | Status: AC
  Administered 2021-09-20: 10 mg via ORAL
  Filled 2021-09-20: qty 1

## 2021-09-20 MED ORDER — ACETAMINOPHEN 325 MG PO TABS
650.0000 mg | ORAL_TABLET | Freq: Once | ORAL | Status: AC
  Administered 2021-09-20: 650 mg via ORAL
  Filled 2021-09-20: qty 2

## 2021-09-20 MED ORDER — SODIUM CHLORIDE 0.9 % IV SOLN
300.0000 mg | Freq: Once | INTRAVENOUS | Status: AC
  Administered 2021-09-20: 300 mg via INTRAVENOUS
  Filled 2021-09-20: qty 300

## 2021-09-20 NOTE — Patient Instructions (Signed)

## 2021-09-20 NOTE — Progress Notes (Signed)
Patient tolerated IV iron infusion well. Monitored for 30 minute post observation period without incident. VSS, ambulatory to lobby.  

## 2021-09-27 ENCOUNTER — Inpatient Hospital Stay: Payer: 59 | Attending: Hematology

## 2021-09-27 ENCOUNTER — Other Ambulatory Visit: Payer: Self-pay

## 2021-09-27 VITALS — BP 131/84 | HR 70 | Temp 98.6°F | Resp 16 | Wt 187.8 lb

## 2021-09-27 DIAGNOSIS — D509 Iron deficiency anemia, unspecified: Secondary | ICD-10-CM | POA: Insufficient documentation

## 2021-09-27 DIAGNOSIS — D508 Other iron deficiency anemias: Secondary | ICD-10-CM

## 2021-09-27 MED ORDER — LORATADINE 10 MG PO TABS
10.0000 mg | ORAL_TABLET | Freq: Once | ORAL | Status: AC
  Administered 2021-09-27: 10 mg via ORAL
  Filled 2021-09-27: qty 1

## 2021-09-27 MED ORDER — SODIUM CHLORIDE 0.9 % IV SOLN: Freq: Once | INTRAVENOUS | Status: AC

## 2021-09-27 MED ORDER — ACETAMINOPHEN 325 MG PO TABS
650.0000 mg | ORAL_TABLET | Freq: Once | ORAL | Status: AC
  Administered 2021-09-27: 650 mg via ORAL
  Filled 2021-09-27: qty 2

## 2021-09-27 MED ORDER — SODIUM CHLORIDE 0.9 % IV SOLN
300.0000 mg | Freq: Once | INTRAVENOUS | Status: AC
  Administered 2021-09-27: 300 mg via INTRAVENOUS
  Filled 2021-09-27: qty 300

## 2021-09-27 NOTE — Progress Notes (Signed)
Patinet waited 30 minute post iron infusion with no issues. Vss upon discharge.  ?

## 2021-10-06 ENCOUNTER — Inpatient Hospital Stay: Payer: 59

## 2021-10-06 ENCOUNTER — Other Ambulatory Visit: Payer: Self-pay

## 2021-10-06 VITALS — BP 133/79 | HR 80 | Temp 98.2°F | Resp 17

## 2021-10-06 DIAGNOSIS — D509 Iron deficiency anemia, unspecified: Secondary | ICD-10-CM

## 2021-10-06 DIAGNOSIS — D508 Other iron deficiency anemias: Secondary | ICD-10-CM

## 2021-10-06 MED ORDER — LORATADINE 10 MG PO TABS
10.0000 mg | ORAL_TABLET | Freq: Once | ORAL | Status: AC
  Administered 2021-10-06: 10 mg via ORAL
  Filled 2021-10-06: qty 1

## 2021-10-06 MED ORDER — ACETAMINOPHEN 325 MG PO TABS
650.0000 mg | ORAL_TABLET | Freq: Once | ORAL | Status: AC
  Administered 2021-10-06: 650 mg via ORAL
  Filled 2021-10-06: qty 2

## 2021-10-06 MED ORDER — SODIUM CHLORIDE 0.9 % IV SOLN: Freq: Once | INTRAVENOUS | Status: AC

## 2021-10-06 MED ORDER — SODIUM CHLORIDE 0.9 % IV SOLN
300.0000 mg | Freq: Once | INTRAVENOUS | Status: AC
  Administered 2021-10-06: 300 mg via INTRAVENOUS
  Filled 2021-10-06: qty 300

## 2021-10-06 NOTE — Patient Instructions (Signed)

## 2021-10-11 ENCOUNTER — Encounter: Payer: Self-pay | Admitting: Hematology

## 2021-10-11 ENCOUNTER — Other Ambulatory Visit (HOSPITAL_COMMUNITY): Payer: Self-pay

## 2021-11-23 DIAGNOSIS — E785 Hyperlipidemia, unspecified: Secondary | ICD-10-CM | POA: Diagnosis not present

## 2021-11-23 DIAGNOSIS — D649 Anemia, unspecified: Secondary | ICD-10-CM | POA: Diagnosis not present

## 2021-11-23 DIAGNOSIS — E119 Type 2 diabetes mellitus without complications: Secondary | ICD-10-CM | POA: Diagnosis not present

## 2021-11-23 DIAGNOSIS — E669 Obesity, unspecified: Secondary | ICD-10-CM | POA: Diagnosis not present

## 2021-11-23 DIAGNOSIS — R635 Abnormal weight gain: Secondary | ICD-10-CM | POA: Diagnosis not present

## 2021-11-23 DIAGNOSIS — E559 Vitamin D deficiency, unspecified: Secondary | ICD-10-CM | POA: Diagnosis not present

## 2021-11-23 DIAGNOSIS — D72819 Decreased white blood cell count, unspecified: Secondary | ICD-10-CM | POA: Diagnosis not present

## 2021-11-23 DIAGNOSIS — Z0000 Encounter for general adult medical examination without abnormal findings: Secondary | ICD-10-CM | POA: Diagnosis not present

## 2021-11-29 ENCOUNTER — Encounter: Payer: Self-pay | Admitting: Hematology

## 2021-11-29 ENCOUNTER — Other Ambulatory Visit (HOSPITAL_COMMUNITY): Payer: Self-pay

## 2021-11-29 ENCOUNTER — Other Ambulatory Visit: Payer: Self-pay | Admitting: Hematology

## 2021-11-29 MED ORDER — POLYSACCHARIDE IRON COMPLEX 150 MG PO CAPS
ORAL_CAPSULE | Freq: Two times a day (BID) | ORAL | 5 refills | Status: DC
  Filled 2021-11-29: qty 60, 30d supply, fill #0
  Filled 2022-03-05: qty 60, 30d supply, fill #1
  Filled 2022-05-07: qty 60, 30d supply, fill #2
  Filled 2022-07-02: qty 60, 30d supply, fill #3
  Filled 2022-08-07: qty 60, 30d supply, fill #4
  Filled 2022-10-15: qty 60, 30d supply, fill #5

## 2022-01-26 ENCOUNTER — Other Ambulatory Visit: Payer: Self-pay

## 2022-01-26 ENCOUNTER — Inpatient Hospital Stay: Payer: 59 | Attending: Hematology

## 2022-01-26 DIAGNOSIS — D649 Anemia, unspecified: Secondary | ICD-10-CM | POA: Diagnosis not present

## 2022-01-26 DIAGNOSIS — D72819 Decreased white blood cell count, unspecified: Secondary | ICD-10-CM | POA: Insufficient documentation

## 2022-01-26 DIAGNOSIS — D500 Iron deficiency anemia secondary to blood loss (chronic): Secondary | ICD-10-CM | POA: Diagnosis not present

## 2022-01-26 DIAGNOSIS — N920 Excessive and frequent menstruation with regular cycle: Secondary | ICD-10-CM | POA: Insufficient documentation

## 2022-01-26 DIAGNOSIS — E119 Type 2 diabetes mellitus without complications: Secondary | ICD-10-CM | POA: Diagnosis not present

## 2022-01-26 DIAGNOSIS — E559 Vitamin D deficiency, unspecified: Secondary | ICD-10-CM | POA: Diagnosis not present

## 2022-01-26 DIAGNOSIS — D508 Other iron deficiency anemias: Secondary | ICD-10-CM

## 2022-01-26 DIAGNOSIS — D509 Iron deficiency anemia, unspecified: Secondary | ICD-10-CM

## 2022-01-26 DIAGNOSIS — E785 Hyperlipidemia, unspecified: Secondary | ICD-10-CM | POA: Diagnosis not present

## 2022-01-26 DIAGNOSIS — E669 Obesity, unspecified: Secondary | ICD-10-CM | POA: Diagnosis not present

## 2022-01-26 DIAGNOSIS — R635 Abnormal weight gain: Secondary | ICD-10-CM | POA: Diagnosis not present

## 2022-01-26 LAB — CBC WITH DIFFERENTIAL (CANCER CENTER ONLY)
Abs Immature Granulocytes: 0.01 10*3/uL (ref 0.00–0.07)
Basophils Absolute: 0 10*3/uL (ref 0.0–0.1)
Basophils Relative: 1 %
Eosinophils Absolute: 0.1 10*3/uL (ref 0.0–0.5)
Eosinophils Relative: 2 %
HCT: 33.1 % — ABNORMAL LOW (ref 36.0–46.0)
Hemoglobin: 10.8 g/dL — ABNORMAL LOW (ref 12.0–15.0)
Immature Granulocytes: 0 %
Lymphocytes Relative: 33 %
Lymphs Abs: 1.1 10*3/uL (ref 0.7–4.0)
MCH: 26.3 pg (ref 26.0–34.0)
MCHC: 32.6 g/dL (ref 30.0–36.0)
MCV: 80.5 fL (ref 80.0–100.0)
Monocytes Absolute: 0.3 10*3/uL (ref 0.1–1.0)
Monocytes Relative: 10 %
Neutro Abs: 1.9 10*3/uL (ref 1.7–7.7)
Neutrophils Relative %: 54 %
Platelet Count: 279 10*3/uL (ref 150–400)
RBC: 4.11 MIL/uL (ref 3.87–5.11)
RDW: 12.5 % (ref 11.5–15.5)
WBC Count: 3.4 10*3/uL — ABNORMAL LOW (ref 4.0–10.5)
nRBC: 0 % (ref 0.0–0.2)

## 2022-01-26 LAB — CMP (CANCER CENTER ONLY)
ALT: 11 U/L (ref 0–44)
AST: 11 U/L — ABNORMAL LOW (ref 15–41)
Albumin: 4.2 g/dL (ref 3.5–5.0)
Alkaline Phosphatase: 45 U/L (ref 38–126)
Anion gap: 6 (ref 5–15)
BUN: 11 mg/dL (ref 6–20)
CO2: 28 mmol/L (ref 22–32)
Calcium: 8.9 mg/dL (ref 8.9–10.3)
Chloride: 103 mmol/L (ref 98–111)
Creatinine: 0.89 mg/dL (ref 0.44–1.00)
GFR, Estimated: >60 mL/min (ref >60)
Glucose, Bld: 164 mg/dL — ABNORMAL HIGH (ref 70–99)
Potassium: 3.7 mmol/L (ref 3.5–5.1)
Sodium: 137 mmol/L (ref 135–145)
Total Bilirubin: 0.3 mg/dL (ref 0.3–1.2)
Total Protein: 7.5 g/dL (ref 6.5–8.1)

## 2022-01-26 LAB — IRON AND IRON BINDING CAPACITY (CC-WL,HP ONLY)
Iron: 29 ug/dL (ref 28–170)
Saturation Ratios: 7 % — ABNORMAL LOW (ref 10.4–31.8)
TIBC: 441 ug/dL (ref 250–450)
UIBC: 412 ug/dL (ref 148–442)

## 2022-01-26 LAB — SAMPLE TO BLOOD BANK

## 2022-01-26 LAB — FERRITIN: Ferritin: 4 ng/mL — ABNORMAL LOW (ref 11–307)

## 2022-01-29 ENCOUNTER — Inpatient Hospital Stay (HOSPITAL_BASED_OUTPATIENT_CLINIC_OR_DEPARTMENT_OTHER): Payer: 59 | Admitting: Hematology

## 2022-01-29 DIAGNOSIS — D500 Iron deficiency anemia secondary to blood loss (chronic): Secondary | ICD-10-CM

## 2022-01-29 DIAGNOSIS — D72819 Decreased white blood cell count, unspecified: Secondary | ICD-10-CM | POA: Diagnosis not present

## 2022-01-29 DIAGNOSIS — N920 Excessive and frequent menstruation with regular cycle: Secondary | ICD-10-CM | POA: Diagnosis not present

## 2022-01-29 NOTE — Progress Notes (Signed)
[name]    HEMATOLOGY/ONCOLOGY CLINIC NOTE  Date of Service: .01/29/2022  PCP: Doristine Section Bonsu MD  CHIEF COMPLAINTS/PURPOSE OF CONSULTATION:   Follow-up for continued evaluation and management of iron deficiency anemia.  HISTORY OF PRESENTING ILLNESS:   Please see previous note for details on initial presentation Interval History:   I connected with Glo Herring on 01/29/2022 at  8:40 AM EDT by telephone visit and verified that I am speaking with the correct person using two identifiers.   I discussed the limitations, risks, security and privacy concerns of performing an evaluation and management service by telemedicine and the availability of in-person appointments. I also discussed with the patient that there may be a patient responsible charge related to this service. The patient expressed understanding and agreed to proceed.   Other persons participating in the visit and their role in the encounter: None  Patient's location: Home Provider's location: [facility]   Chief Complaint: Continued evaluation and management of iron deficiency anemia.  Patient notes that she tolerated her last IV Venofer infusions well and notes some improvement in her energy levels.  She is no longer having any dizzy episodes.  Her hemoglobin is improved to 10.8.  She is still has significant iron deficiency with a ferritin of 4 and iron saturation of 7%.  She notes that her menstrual losses have moderated since she had a Mirena IUD replaced in March this year. No GI bleeding, hematuria or any other signs of bleeding at this time.   MEDICAL HISTORY:   #1 obesity #2 iron deficiency anemia #3 hypercholesterolemia not on medications  #4 obesity - on phentermine for weight loss #5 history of vitamin D deficiency #6 diabetes  SURGICAL HISTORY: No previous surgeries  SOCIAL HISTORY: Social History   Socioeconomic History   Marital status: Single    Spouse name: Not on file   Number of children:  Not on file   Years of education: Not on file   Highest education level: Not on file  Occupational History   Not on file  Tobacco Use   Smoking status: Never   Smokeless tobacco: Never  Vaping Use   Vaping Use: Never used  Substance and Sexual Activity   Alcohol use: No   Drug use: No   Sexual activity: Not Currently  Other Topics Concern   Not on file  Social History Narrative   Not on file   Social Determinants of Health   Financial Resource Strain: Not on file  Food Insecurity: Not on file  Transportation Needs: Not on file  Physical Activity: Not on file  Stress: Not on file  Social Connections: Not on file  Intimate Partner Violence: Not on file    FAMILY HISTORY: No family history of blood disorders or cancer that she is aware of.  ALLERGIES:  is allergic to bee venom and penicillins.  MEDICATIONS:  Current Outpatient Medications  Medication Sig Dispense Refill   iron polysaccharides (FERREX 150) 150 MG capsule TAKE 1 CAPSULE BY MOUTH TWO TIMES DAILY 60 capsule 5   metFORMIN (GLUCOPHAGE) 500 MG tablet Take 1 tablet by mouth twice a day 60 tablet 0   metFORMIN (GLUCOPHAGE) 500 MG tablet Take 1 tablet by mouth 2 times a day for 30 day(s) 60 tablet 2   Multiple Vitamins-Minerals (MULTIVITAMIN) tablet Take 1 tablet by mouth daily.     No current facility-administered medications for this visit.    REVIEW OF SYSTEMS:   Patient notes no concerns with GI bleeding, hematuria,  gum bleeding or any other source of blood loss other than moderate menstrual losses.  PHYSICAL EXAMINATION: Telemedicine visit LABORATORY DATA:  I have reviewed the data as listed  .    Latest Ref Rng & Units 01/26/2022    9:11 AM 08/10/2020    2:26 PM 08/03/2020   10:58 AM  CBC  WBC 4.0 - 10.5 K/uL 3.4  6.4  5.7   Hemoglobin 12.0 - 15.0 g/dL 10.8  9.8  5.4   Hematocrit 36.0 - 46.0 % 33.1  33.5  20.0   Platelets 150 - 400 K/uL 279  426  276    . CBC    Component Value Date/Time   WBC  3.4 (L) 01/26/2022 0911   WBC 5.7 08/03/2020 1058   RBC 4.11 01/26/2022 0911   HGB 10.8 (L) 01/26/2022 0911   HGB 8.6 (L) 05/12/2020 1005   HGB 12.5 10/25/2015 1608   HCT 33.1 (L) 01/26/2022 0911   HCT 27.3 (L) 05/12/2020 1005   HCT 38.2 10/25/2015 1608   PLT 279 01/26/2022 0911   PLT 262 05/12/2020 1005   MCV 80.5 01/26/2022 0911   MCV 82 05/12/2020 1005   MCV 81.3 10/25/2015 1608   MCH 26.3 01/26/2022 0911   MCHC 32.6 01/26/2022 0911   RDW 12.5 01/26/2022 0911   RDW 15.5 (H) 05/12/2020 1005   RDW 13.9 10/25/2015 1608   LYMPHSABS 1.1 01/26/2022 0911   LYMPHSABS 1.8 10/25/2015 1608   MONOABS 0.3 01/26/2022 0911   MONOABS 0.5 10/25/2015 1608   EOSABS 0.1 01/26/2022 0911   EOSABS 0.1 10/25/2015 1608   BASOSABS 0.0 01/26/2022 0911   BASOSABS 0.0 10/25/2015 1608     .    Latest Ref Rng & Units 01/26/2022    9:11 AM 02/01/2020    1:25 PM 07/22/2018    3:23 PM  CMP  Glucose 70 - 99 mg/dL 164  144  97   BUN 6 - 20 mg/dL '11  7  11   '$ Creatinine 0.44 - 1.00 mg/dL 0.89  0.88  0.93   Sodium 135 - 145 mmol/L 137  137  139   Potassium 3.5 - 5.1 mmol/L 3.7  3.5  3.9   Chloride 98 - 111 mmol/L 103  103  104   CO2 22 - 32 mmol/L '28  25  27   '$ Calcium 8.9 - 10.3 mg/dL 8.9  9.9  9.4   Total Protein 6.5 - 8.1 g/dL 7.5  7.4  7.7   Total Bilirubin 0.3 - 1.2 mg/dL 0.3  <0.2  0.2   Alkaline Phos 38 - 126 U/L 45  48  42   AST 15 - 41 U/L '11  13  18   '$ ALT 0 - 44 U/L '11  12  15    '$ . Lab Results  Component Value Date   IRON 29 01/26/2022   TIBC 441 01/26/2022   IRONPCTSAT 7 (L) 01/26/2022   (Iron and TIBC)  Lab Results  Component Value Date   FERRITIN 4 (L) 01/26/2022   02/20/18 Anemia Profile B:    RADIOGRAPHIC STUDIES: I have personally reviewed the radiological images as listed and agreed with the findings in the report. No results found.  ASSESSMENT & PLAN:   36 y.o. female from [place] with  #1 Iron deficiency anemia Current iron deficiency anemia due to heavy  menstrual losses.  #2 mild leukopenia WBC count 3.5k  #3 menorrhagia improving with Mirena IUD placed in March 2023 per patient. PLAN: -  Patient's labs from 01/26/2022 were discussed in detail with her. -Patient tolerated IV Venofer well with no significant toxicities. -Labs show improvement in hemoglobin to 10.8 from a previous hemoglobin of 8.4. -Patient is still iron deficient with a ferritin of 4 and iron saturation of 7%. -We discussed options for additional IV iron replacement versus p.o. iron and she prefers to have the IV iron infusions. -We shall set her up for IV Venofer 300 mg weekly x3 more doses with same premedications. -Continue follow-up with PCP and OB/GYN to manage menorrhagia. We shall see her back in 6 months If no further IV iron requirements arise at that time we will likely discharge her back to her primary care physician with oral maintenance iron.   FOLLOW UP: IV venofer weekly x 3 doses (scheduling per patient preference) Labs in 6 months , Phone visit 1 day after labs  The total time spent in the appointment was 15 minutes*.  All of the patient's questions were answered with apparent satisfaction. The patient knows to call the clinic with any problems, questions or concerns.   Sullivan Lone MD MS AAHIVMS [facility] Hematology/Oncology Physician [facility]  .*Total Encounter Time as defined by the Centers for Medicare and Medicaid Services includes, in addition to the face-to-face time of a patient visit (documented in the note above) non-face-to-face time: obtaining and reviewing outside history, ordering and reviewing medications, tests or procedures, care coordination (communications with other health care professionals or caregivers) and documentation in the medical record.

## 2022-02-05 ENCOUNTER — Other Ambulatory Visit: Payer: Self-pay

## 2022-02-05 ENCOUNTER — Telehealth: Payer: Self-pay | Admitting: Hematology

## 2022-02-05 ENCOUNTER — Inpatient Hospital Stay: Payer: 59

## 2022-02-05 VITALS — BP 148/93 | HR 81 | Temp 98.3°F | Resp 17

## 2022-02-05 DIAGNOSIS — N920 Excessive and frequent menstruation with regular cycle: Secondary | ICD-10-CM | POA: Diagnosis not present

## 2022-02-05 DIAGNOSIS — D72819 Decreased white blood cell count, unspecified: Secondary | ICD-10-CM | POA: Diagnosis not present

## 2022-02-05 DIAGNOSIS — D509 Iron deficiency anemia, unspecified: Secondary | ICD-10-CM

## 2022-02-05 DIAGNOSIS — D508 Other iron deficiency anemias: Secondary | ICD-10-CM

## 2022-02-05 DIAGNOSIS — D500 Iron deficiency anemia secondary to blood loss (chronic): Secondary | ICD-10-CM | POA: Diagnosis not present

## 2022-02-05 MED ORDER — LORATADINE 10 MG PO TABS
10.0000 mg | ORAL_TABLET | Freq: Once | ORAL | Status: AC
  Administered 2022-02-05: 10 mg via ORAL

## 2022-02-05 MED ORDER — ACETAMINOPHEN 325 MG PO TABS
650.0000 mg | ORAL_TABLET | Freq: Once | ORAL | Status: AC
  Administered 2022-02-05: 650 mg via ORAL

## 2022-02-05 MED ORDER — SODIUM CHLORIDE 0.9 % IV SOLN: Freq: Once | INTRAVENOUS | Status: AC

## 2022-02-05 MED ORDER — SODIUM CHLORIDE 0.9 % IV SOLN
300.0000 mg | Freq: Once | INTRAVENOUS | Status: AC
  Administered 2022-02-05: 300 mg via INTRAVENOUS
  Filled 2022-02-05: qty 300

## 2022-02-05 NOTE — Telephone Encounter (Signed)
Left message with rescheduled upcoming appointment due to provider's template. 

## 2022-02-05 NOTE — Patient Instructions (Signed)

## 2022-02-12 ENCOUNTER — Inpatient Hospital Stay (HOSPITAL_BASED_OUTPATIENT_CLINIC_OR_DEPARTMENT_OTHER): Payer: 59

## 2022-02-12 ENCOUNTER — Other Ambulatory Visit: Payer: Self-pay

## 2022-02-12 VITALS — BP 142/87 | HR 77 | Temp 98.0°F | Resp 18

## 2022-02-12 DIAGNOSIS — D72819 Decreased white blood cell count, unspecified: Secondary | ICD-10-CM | POA: Diagnosis not present

## 2022-02-12 DIAGNOSIS — D508 Other iron deficiency anemias: Secondary | ICD-10-CM

## 2022-02-12 DIAGNOSIS — D500 Iron deficiency anemia secondary to blood loss (chronic): Secondary | ICD-10-CM | POA: Diagnosis not present

## 2022-02-12 DIAGNOSIS — D509 Iron deficiency anemia, unspecified: Secondary | ICD-10-CM

## 2022-02-12 DIAGNOSIS — N920 Excessive and frequent menstruation with regular cycle: Secondary | ICD-10-CM | POA: Diagnosis not present

## 2022-02-12 MED ORDER — LORATADINE 10 MG PO TABS
10.0000 mg | ORAL_TABLET | Freq: Once | ORAL | Status: AC
  Administered 2022-02-12: 10 mg via ORAL
  Filled 2022-02-12: qty 1

## 2022-02-12 MED ORDER — ACETAMINOPHEN 325 MG PO TABS
650.0000 mg | ORAL_TABLET | Freq: Once | ORAL | Status: AC
  Administered 2022-02-12: 650 mg via ORAL
  Filled 2022-02-12: qty 2

## 2022-02-12 MED ORDER — SODIUM CHLORIDE 0.9 % IV SOLN
300.0000 mg | Freq: Once | INTRAVENOUS | Status: AC
  Administered 2022-02-12: 300 mg via INTRAVENOUS
  Filled 2022-02-12: qty 300

## 2022-02-12 MED ORDER — SODIUM CHLORIDE 0.9 % IV SOLN: Freq: Once | INTRAVENOUS | Status: AC

## 2022-02-12 NOTE — Progress Notes (Signed)
Pt declined 30 min post iron observation. VSS at discharge

## 2022-02-19 ENCOUNTER — Other Ambulatory Visit: Payer: Self-pay

## 2022-02-19 ENCOUNTER — Inpatient Hospital Stay: Payer: 59

## 2022-02-19 VITALS — BP 134/84 | HR 83 | Temp 98.1°F | Resp 18

## 2022-02-19 DIAGNOSIS — D509 Iron deficiency anemia, unspecified: Secondary | ICD-10-CM

## 2022-02-19 DIAGNOSIS — D500 Iron deficiency anemia secondary to blood loss (chronic): Secondary | ICD-10-CM | POA: Diagnosis not present

## 2022-02-19 DIAGNOSIS — N920 Excessive and frequent menstruation with regular cycle: Secondary | ICD-10-CM | POA: Diagnosis not present

## 2022-02-19 DIAGNOSIS — D72819 Decreased white blood cell count, unspecified: Secondary | ICD-10-CM | POA: Diagnosis not present

## 2022-02-19 DIAGNOSIS — D508 Other iron deficiency anemias: Secondary | ICD-10-CM

## 2022-02-19 MED ORDER — SODIUM CHLORIDE 0.9 % IV SOLN
300.0000 mg | Freq: Once | INTRAVENOUS | Status: AC
  Administered 2022-02-19: 300 mg via INTRAVENOUS
  Filled 2022-02-19: qty 300

## 2022-02-19 MED ORDER — ACETAMINOPHEN 325 MG PO TABS
650.0000 mg | ORAL_TABLET | Freq: Once | ORAL | Status: AC
  Administered 2022-02-19: 650 mg via ORAL
  Filled 2022-02-19: qty 2

## 2022-02-19 MED ORDER — LORATADINE 10 MG PO TABS
10.0000 mg | ORAL_TABLET | Freq: Once | ORAL | Status: AC
  Administered 2022-02-19: 10 mg via ORAL
  Filled 2022-02-19: qty 1

## 2022-02-19 MED ORDER — SODIUM CHLORIDE 0.9 % IV SOLN: Freq: Once | INTRAVENOUS | Status: AC

## 2022-02-19 NOTE — Patient Instructions (Signed)

## 2022-02-19 NOTE — Progress Notes (Signed)
Declined to stay 30 minute post observation, VSS and no signs of distress noted upon discharge

## 2022-03-05 ENCOUNTER — Other Ambulatory Visit (HOSPITAL_COMMUNITY): Payer: Self-pay

## 2022-03-05 ENCOUNTER — Encounter: Payer: Self-pay | Admitting: Hematology

## 2022-05-07 ENCOUNTER — Encounter: Payer: Self-pay | Admitting: Hematology

## 2022-05-07 ENCOUNTER — Other Ambulatory Visit (HOSPITAL_COMMUNITY): Payer: Self-pay

## 2022-05-24 ENCOUNTER — Other Ambulatory Visit (HOSPITAL_COMMUNITY): Payer: Self-pay

## 2022-05-24 DIAGNOSIS — E119 Type 2 diabetes mellitus without complications: Secondary | ICD-10-CM | POA: Diagnosis not present

## 2022-05-24 DIAGNOSIS — R635 Abnormal weight gain: Secondary | ICD-10-CM | POA: Diagnosis not present

## 2022-05-24 DIAGNOSIS — D649 Anemia, unspecified: Secondary | ICD-10-CM | POA: Diagnosis not present

## 2022-05-24 DIAGNOSIS — D72819 Decreased white blood cell count, unspecified: Secondary | ICD-10-CM | POA: Diagnosis not present

## 2022-05-24 DIAGNOSIS — E559 Vitamin D deficiency, unspecified: Secondary | ICD-10-CM | POA: Diagnosis not present

## 2022-05-24 DIAGNOSIS — E669 Obesity, unspecified: Secondary | ICD-10-CM | POA: Diagnosis not present

## 2022-05-24 DIAGNOSIS — E785 Hyperlipidemia, unspecified: Secondary | ICD-10-CM | POA: Diagnosis not present

## 2022-05-24 MED ORDER — CLINDAMYCIN HCL 150 MG PO CAPS
150.0000 mg | ORAL_CAPSULE | Freq: Four times a day (QID) | ORAL | 0 refills | Status: DC
  Filled 2022-05-24: qty 40, 10d supply, fill #0

## 2022-06-07 ENCOUNTER — Encounter: Payer: Self-pay | Admitting: Hematology

## 2022-06-09 ENCOUNTER — Encounter: Payer: Self-pay | Admitting: Hematology

## 2022-07-02 ENCOUNTER — Encounter: Payer: Self-pay | Admitting: Hematology

## 2022-07-02 ENCOUNTER — Other Ambulatory Visit (HOSPITAL_COMMUNITY): Payer: Self-pay

## 2022-07-27 ENCOUNTER — Other Ambulatory Visit: Payer: Self-pay

## 2022-07-27 DIAGNOSIS — D509 Iron deficiency anemia, unspecified: Secondary | ICD-10-CM

## 2022-07-30 ENCOUNTER — Inpatient Hospital Stay: Payer: Commercial Managed Care - PPO | Attending: Hematology

## 2022-07-30 DIAGNOSIS — D509 Iron deficiency anemia, unspecified: Secondary | ICD-10-CM | POA: Insufficient documentation

## 2022-07-30 DIAGNOSIS — D72819 Decreased white blood cell count, unspecified: Secondary | ICD-10-CM | POA: Insufficient documentation

## 2022-07-30 DIAGNOSIS — N920 Excessive and frequent menstruation with regular cycle: Secondary | ICD-10-CM | POA: Diagnosis not present

## 2022-07-30 DIAGNOSIS — E1165 Type 2 diabetes mellitus with hyperglycemia: Secondary | ICD-10-CM | POA: Insufficient documentation

## 2022-07-30 LAB — CBC WITH DIFFERENTIAL (CANCER CENTER ONLY)
Abs Immature Granulocytes: 0.01 10*3/uL (ref 0.00–0.07)
Basophils Absolute: 0 10*3/uL (ref 0.0–0.1)
Basophils Relative: 0 %
Eosinophils Absolute: 0 10*3/uL (ref 0.0–0.5)
Eosinophils Relative: 1 %
HCT: 34.4 % — ABNORMAL LOW (ref 36.0–46.0)
Hemoglobin: 10.8 g/dL — ABNORMAL LOW (ref 12.0–15.0)
Immature Granulocytes: 0 %
Lymphocytes Relative: 34 %
Lymphs Abs: 1.5 10*3/uL (ref 0.7–4.0)
MCH: 23.2 pg — ABNORMAL LOW (ref 26.0–34.0)
MCHC: 31.4 g/dL (ref 30.0–36.0)
MCV: 73.8 fL — ABNORMAL LOW (ref 80.0–100.0)
Monocytes Absolute: 0.4 10*3/uL (ref 0.1–1.0)
Monocytes Relative: 8 %
Neutro Abs: 2.6 10*3/uL (ref 1.7–7.7)
Neutrophils Relative %: 57 %
Platelet Count: 325 10*3/uL (ref 150–400)
RBC: 4.66 MIL/uL (ref 3.87–5.11)
RDW: 14.8 % (ref 11.5–15.5)
WBC Count: 4.5 10*3/uL (ref 4.0–10.5)
nRBC: 0 % (ref 0.0–0.2)

## 2022-07-30 LAB — IRON AND IRON BINDING CAPACITY (CC-WL,HP ONLY)
Iron: 42 ug/dL (ref 28–170)
Saturation Ratios: 10 % — ABNORMAL LOW (ref 10.4–31.8)
TIBC: 417 ug/dL (ref 250–450)
UIBC: 375 ug/dL (ref 148–442)

## 2022-07-30 LAB — CMP (CANCER CENTER ONLY)
ALT: 10 U/L (ref 0–44)
AST: 11 U/L — ABNORMAL LOW (ref 15–41)
Albumin: 4 g/dL (ref 3.5–5.0)
Alkaline Phosphatase: 55 U/L (ref 38–126)
Anion gap: 8 (ref 5–15)
BUN: 10 mg/dL (ref 6–20)
CO2: 24 mmol/L (ref 22–32)
Calcium: 9.8 mg/dL (ref 8.9–10.3)
Chloride: 98 mmol/L (ref 98–111)
Creatinine: 0.75 mg/dL (ref 0.44–1.00)
GFR, Estimated: >60 mL/min (ref >60)
Glucose, Bld: 349 mg/dL — ABNORMAL HIGH (ref 70–99)
Potassium: 3.9 mmol/L (ref 3.5–5.1)
Sodium: 130 mmol/L — ABNORMAL LOW (ref 135–145)
Total Bilirubin: 0.3 mg/dL (ref 0.3–1.2)
Total Protein: 6.7 g/dL (ref 6.5–8.1)

## 2022-07-30 LAB — FERRITIN: Ferritin: 3 ng/mL — ABNORMAL LOW (ref 11–307)

## 2022-07-31 ENCOUNTER — Telehealth: Payer: 59 | Admitting: Hematology

## 2022-07-31 ENCOUNTER — Inpatient Hospital Stay (HOSPITAL_BASED_OUTPATIENT_CLINIC_OR_DEPARTMENT_OTHER): Payer: Commercial Managed Care - PPO | Admitting: Hematology

## 2022-07-31 DIAGNOSIS — D500 Iron deficiency anemia secondary to blood loss (chronic): Secondary | ICD-10-CM | POA: Diagnosis not present

## 2022-07-31 DIAGNOSIS — D72819 Decreased white blood cell count, unspecified: Secondary | ICD-10-CM | POA: Diagnosis not present

## 2022-07-31 DIAGNOSIS — D509 Iron deficiency anemia, unspecified: Secondary | ICD-10-CM | POA: Diagnosis not present

## 2022-07-31 DIAGNOSIS — N920 Excessive and frequent menstruation with regular cycle: Secondary | ICD-10-CM | POA: Diagnosis not present

## 2022-07-31 DIAGNOSIS — E1165 Type 2 diabetes mellitus with hyperglycemia: Secondary | ICD-10-CM | POA: Diagnosis not present

## 2022-07-31 NOTE — Progress Notes (Signed)
Tonya Gilbert    HEMATOLOGY/ONCOLOGY CLINIC NOTE  Date of Service: 07/31/22   PCP: Doristine Section Bonsu MD  CHIEF COMPLAINTS/PURPOSE OF CONSULTATION:   Follow-up for continued evaluation and management of iron deficiency anemia.  HISTORY OF PRESENTING ILLNESS:   Please see previous note for details on initial presentation Interval History:   I connected with Glo Herring on 07/31/2022 at  3:30 PM EST by telephone visit and verified that I am speaking with the correct person using two identifiers.   I had pone visit with the patient on 01/29/2022 and she was doing well overall. She was tolerating IV Iron well without any toxicities.   Patient reports she has been doing well overall without new medical concerns. She notes that her PCP recently diagnosed her with diabetes. She is currently taking Metformin to control diabetes.   Patient notes she still has Mirena IUD since last year. She still has her menstrual cycle, but they are not as heavy as they used to be. She notes that her menstrual cycle lasts for about 4-5 days.   She notes she tolerated her IV Iron infusion well without any toxicities. She denies feeling different after getting the IV Iron infusion.   I discussed the limitations, risks, security and privacy concerns of performing an evaluation and management service by telemedicine and the availability of in-person appointments. I also discussed with the patient that there may be a patient responsible charge related to this service. The patient expressed understanding and agreed to proceed.   Other persons participating in the visit and their role in the encounter: None  Patient's location: Home Provider's location: [place]:   #1 obesity #2 iron deficiency anemia #3 hypercholesterolemia not on medications  #4 obesity - on phentermine for weight loss #5 history of vitamin D deficiency #6 diabetes  SURGICAL HISTORY: No previous surgeries  SOCIAL  HISTORY: Social History   Socioeconomic History   Marital status: Single    Spouse name: Not on file   Number of children: Not on file   Years of education: Not on file   Highest education level: Not on file  Occupational History   Not on file  Tobacco Use   Smoking status: Never   Smokeless tobacco: Never  Vaping Use   Vaping Use: Never used  Substance and Sexual Activity   Alcohol use: No   Drug use: No   Sexual activity: Not Currently  Other Topics Concern   Not on file  Social History Narrative   Not on file   Social Determinants of Health   Financial Resource Strain: Not on file  Food Insecurity: Not on file  Transportation Needs: Not on file  Physical Activity: Not on file  Stress: Not on file  Social Connections: Not on file  Intimate Partner Violence: Not on file    FAMILY HISTORY: No family history of blood disorders or cancer that she is aware of.  ALLERGIES:  is allergic to bee venom and penicillins.  MEDICATIONS:  Current Outpatient Medications  Medication Sig Dispense Refill   clindamycin (CLEOCIN) 150 MG capsule Take 1 capsule (150 mg total) by mouth 4 (four) times daily until gone 40 capsule 0   iron polysaccharides (FERREX 150) 150 MG capsule TAKE 1 CAPSULE BY MOUTH TWO TIMES DAILY 60 capsule 5   metFORMIN (GLUCOPHAGE) 500 MG tablet Take 1 tablet by mouth twice a day 60 tablet 0   metFORMIN (GLUCOPHAGE) 500 MG tablet Take 1 tablet by mouth  2 times a day for 30 day(s) 60 tablet 2   Multiple Vitamins-Minerals (MULTIVITAMIN) tablet Take 1 tablet by mouth daily.     No current facility-administered medications for this visit.    REVIEW OF SYSTEMS:   Patient notes no concerns with GI bleeding, hematuria, gum bleeding or any other source of blood loss other than moderate menstrual losses.  PHYSICAL EXAMINATION: Telemedicine visit LABORATORY DATA:  I have reviewed the data as listed  .    Latest Ref Rng & Units 07/30/2022    1:22 PM 01/26/2022     9:11 AM 08/10/2020    2:26 PM  CBC  WBC 4.0 - 10.5 K/uL 4.5  3.4  6.4   Hemoglobin 12.0 - 15.0 g/dL 10.8  10.8  9.8   Hematocrit 36.0 - 46.0 % 34.4  33.1  33.5   Platelets 150 - 400 K/uL 325  279  426    . CBC    Component Value Date/Time   WBC 4.5 07/30/2022 1322   WBC 5.7 08/03/2020 1058   RBC 4.66 07/30/2022 1322   HGB 10.8 (L) 07/30/2022 1322   HGB 8.6 (L) 05/12/2020 1005   HGB 12.5 10/25/2015 1608   HCT 34.4 (L) 07/30/2022 1322   HCT 27.3 (L) 05/12/2020 1005   HCT 38.2 10/25/2015 1608   PLT 325 07/30/2022 1322   PLT 262 05/12/2020 1005   MCV 73.8 (L) 07/30/2022 1322   MCV 82 05/12/2020 1005   MCV 81.3 10/25/2015 1608   MCH 23.2 (L) 07/30/2022 1322   MCHC 31.4 07/30/2022 1322   RDW 14.8 07/30/2022 1322   RDW 15.5 (H) 05/12/2020 1005   RDW 13.9 10/25/2015 1608   LYMPHSABS 1.5 07/30/2022 1322   LYMPHSABS 1.8 10/25/2015 1608   MONOABS 0.4 07/30/2022 1322   MONOABS 0.5 10/25/2015 1608   EOSABS 0.0 07/30/2022 1322   EOSABS 0.1 10/25/2015 1608   BASOSABS 0.0 07/30/2022 1322   BASOSABS 0.0 10/25/2015 1608     .    Latest Ref Rng & Units 07/30/2022    1:22 PM 01/26/2022    9:11 AM 02/01/2020    1:25 PM  CMP  Glucose 70 - 99 mg/dL 349  164  144   BUN 6 - 20 mg/dL 10  11  7   $ Creatinine 0.44 - 1.00 mg/dL 0.75  0.89  0.88   Sodium 135 - 145 mmol/L 130  137  137   Potassium 3.5 - 5.1 mmol/L 3.9  3.7  3.5   Chloride 98 - 111 mmol/L 98  103  103   CO2 22 - 32 mmol/L 24  28  25   $ Calcium 8.9 - 10.3 mg/dL 9.8  8.9  9.9   Total Protein 6.5 - 8.1 g/dL 6.7  7.5  7.4   Total Bilirubin 0.3 - 1.2 mg/dL 0.3  0.3  <0.2   Alkaline Phos 38 - 126 U/L 55  45  48   AST 15 - 41 U/L 11  11  13   $ ALT 0 - 44 U/L 10  11  12    $ . Lab Results  Component Value Date   IRON 42 07/30/2022   TIBC 417 07/30/2022   IRONPCTSAT 10 (L) 07/30/2022   (Iron and TIBC)  Lab Results  Component Value Date   FERRITIN 3 (L) 07/30/2022   02/20/18 Anemia Profile B:    RADIOGRAPHIC STUDIES: I have  personally reviewed the radiological images as listed and agreed with the findings in the report.  No results found.  ASSESSMENT & PLAN:   37 y.o. female from [place] with  #1 Iron deficiency anemia Current iron deficiency anemia due to heavy menstrual losses.  #2 mild leukopenia WBC count 3.5k  #3 menorrhagia improving with Mirena IUD placed in March 2023 per patient.  PLAN: -Discussed lab results from 07/30/2022 with the patient. CBC shows slightly decreased hemoglobin of 10.8 and hematocrit of 34.4. CMP shows elevated blood glucose of 349 and decreased sodium of 130. Ferritin was 3 and iron saturation was 10. Patient is still iron deficient.  -Dicussed the option of IV Iron infusion weekly x 4 doses.  -Patient agrees with the recommendation.  -Recommended to follow up with PCP regarding diabetes.  -Recommended to follow-up with OBGYN.   FOLLOW-UP: IV venofer 326m weekly x 4 doses Phone visit with Dr KIrene Limboin 6 months with labs the day before the phone visit  The total time spent in the appointment was 20 minutes* .  All of the patient's questions were answered with apparent satisfaction. The patient knows to call the clinic with any problems, questions or concerns.   GSullivan LoneMD MS AAHIVMS SLibertas Green BayCMercy Gilbert Medical CenterHematology/Oncology Physician [facility]Ab[facility] .*Total Encounter Time as defined by the Centers for Medicare and Medicaid Services includes, in addition to the face-to-face time of a patient visit (documented in the note above) non-face-to-face time: obtaining and reviewing outside history, ordering and reviewing medications, tests or procedures, care coordination (communications with other health care professionals or caregivers) and documentation in the medical record.   I, PCleda Mccreedy am acting as a sEducation administratorfor GSullivan Lone MD. .I have reviewed the above documentation for accuracy and completeness, and I agree with the above. .Brunetta GeneraMD

## 2022-08-07 ENCOUNTER — Encounter: Payer: Self-pay | Admitting: Hematology

## 2022-08-07 ENCOUNTER — Other Ambulatory Visit (HOSPITAL_COMMUNITY): Payer: Self-pay

## 2022-08-10 DIAGNOSIS — E669 Obesity, unspecified: Secondary | ICD-10-CM | POA: Diagnosis not present

## 2022-08-10 DIAGNOSIS — D72819 Decreased white blood cell count, unspecified: Secondary | ICD-10-CM | POA: Diagnosis not present

## 2022-08-10 DIAGNOSIS — E559 Vitamin D deficiency, unspecified: Secondary | ICD-10-CM | POA: Diagnosis not present

## 2022-08-10 DIAGNOSIS — E119 Type 2 diabetes mellitus without complications: Secondary | ICD-10-CM | POA: Diagnosis not present

## 2022-08-10 DIAGNOSIS — E785 Hyperlipidemia, unspecified: Secondary | ICD-10-CM | POA: Diagnosis not present

## 2022-08-10 DIAGNOSIS — D649 Anemia, unspecified: Secondary | ICD-10-CM | POA: Diagnosis not present

## 2022-08-20 ENCOUNTER — Other Ambulatory Visit: Payer: Self-pay

## 2022-08-20 ENCOUNTER — Inpatient Hospital Stay: Payer: Commercial Managed Care - PPO

## 2022-08-20 ENCOUNTER — Other Ambulatory Visit: Payer: Self-pay | Admitting: Hematology

## 2022-08-20 VITALS — BP 151/95 | HR 82 | Temp 99.0°F

## 2022-08-20 DIAGNOSIS — E1165 Type 2 diabetes mellitus with hyperglycemia: Secondary | ICD-10-CM | POA: Diagnosis not present

## 2022-08-20 DIAGNOSIS — D509 Iron deficiency anemia, unspecified: Secondary | ICD-10-CM | POA: Diagnosis not present

## 2022-08-20 DIAGNOSIS — D72819 Decreased white blood cell count, unspecified: Secondary | ICD-10-CM | POA: Diagnosis not present

## 2022-08-20 DIAGNOSIS — D508 Other iron deficiency anemias: Secondary | ICD-10-CM

## 2022-08-20 DIAGNOSIS — N920 Excessive and frequent menstruation with regular cycle: Secondary | ICD-10-CM | POA: Diagnosis not present

## 2022-08-20 MED ORDER — SODIUM CHLORIDE 0.9 % IV SOLN: Freq: Once | INTRAVENOUS | Status: AC

## 2022-08-20 MED ORDER — SODIUM CHLORIDE 0.9 % IV SOLN
300.0000 mg | Freq: Once | INTRAVENOUS | Status: AC
  Administered 2022-08-20: 300 mg via INTRAVENOUS
  Filled 2022-08-20: qty 300

## 2022-08-20 MED ORDER — DIPHENHYDRAMINE HCL 25 MG PO CAPS
25.0000 mg | ORAL_CAPSULE | Freq: Once | ORAL | Status: AC
  Administered 2022-08-20: 25 mg via ORAL
  Filled 2022-08-20: qty 1

## 2022-08-20 MED ORDER — ACETAMINOPHEN 325 MG PO TABS
650.0000 mg | ORAL_TABLET | Freq: Once | ORAL | Status: AC
  Administered 2022-08-20: 650 mg via ORAL
  Filled 2022-08-20: qty 2

## 2022-08-20 NOTE — Patient Instructions (Signed)

## 2022-08-20 NOTE — Progress Notes (Signed)
Pt declined to stay for 30 minute post infusion observation, Vital signs stable at discharge. Pt ambulated independently to lobby.

## 2022-08-27 ENCOUNTER — Other Ambulatory Visit: Payer: Self-pay

## 2022-08-27 ENCOUNTER — Inpatient Hospital Stay: Payer: Commercial Managed Care - PPO | Attending: Hematology

## 2022-08-27 VITALS — BP 133/86 | HR 78 | Temp 98.4°F | Resp 18

## 2022-08-27 DIAGNOSIS — D509 Iron deficiency anemia, unspecified: Secondary | ICD-10-CM | POA: Insufficient documentation

## 2022-08-27 DIAGNOSIS — D508 Other iron deficiency anemias: Secondary | ICD-10-CM

## 2022-08-27 MED ORDER — DIPHENHYDRAMINE HCL 25 MG PO CAPS
25.0000 mg | ORAL_CAPSULE | Freq: Once | ORAL | Status: AC
  Administered 2022-08-27: 25 mg via ORAL
  Filled 2022-08-27: qty 1

## 2022-08-27 MED ORDER — SODIUM CHLORIDE 0.9 % IV SOLN
300.0000 mg | Freq: Once | INTRAVENOUS | Status: AC
  Administered 2022-08-27: 300 mg via INTRAVENOUS
  Filled 2022-08-27: qty 300

## 2022-08-27 MED ORDER — ACETAMINOPHEN 325 MG PO TABS
650.0000 mg | ORAL_TABLET | Freq: Once | ORAL | Status: AC
  Administered 2022-08-27: 650 mg via ORAL
  Filled 2022-08-27: qty 2

## 2022-08-27 MED ORDER — SODIUM CHLORIDE 0.9 % IV SOLN: Freq: Once | INTRAVENOUS | Status: AC

## 2022-08-27 NOTE — Progress Notes (Unsigned)
Patient declined to stay for 30 minute post-observation period following iron infusion.   Patient's vital signs retaken prior to discharge and remained within normal parameters.  Patient discharged in stable condition.

## 2022-08-28 ENCOUNTER — Encounter: Payer: Self-pay | Admitting: Hematology

## 2022-09-03 ENCOUNTER — Inpatient Hospital Stay: Payer: Commercial Managed Care - PPO

## 2022-09-03 VITALS — BP 130/86 | HR 91 | Temp 98.2°F | Resp 16

## 2022-09-03 DIAGNOSIS — D508 Other iron deficiency anemias: Secondary | ICD-10-CM

## 2022-09-03 DIAGNOSIS — D509 Iron deficiency anemia, unspecified: Secondary | ICD-10-CM | POA: Diagnosis not present

## 2022-09-03 MED ORDER — SODIUM CHLORIDE 0.9 % IV SOLN
300.0000 mg | Freq: Once | INTRAVENOUS | Status: AC
  Administered 2022-09-03: 300 mg via INTRAVENOUS
  Filled 2022-09-03: qty 300

## 2022-09-03 MED ORDER — ACETAMINOPHEN 325 MG PO TABS
650.0000 mg | ORAL_TABLET | Freq: Once | ORAL | Status: AC
  Administered 2022-09-03: 650 mg via ORAL
  Filled 2022-09-03: qty 2

## 2022-09-03 MED ORDER — DIPHENHYDRAMINE HCL 25 MG PO CAPS
25.0000 mg | ORAL_CAPSULE | Freq: Once | ORAL | Status: AC
  Administered 2022-09-03: 25 mg via ORAL
  Filled 2022-09-03: qty 1

## 2022-09-03 MED ORDER — SODIUM CHLORIDE 0.9 % IV SOLN: Freq: Once | INTRAVENOUS | Status: AC

## 2022-09-03 NOTE — Progress Notes (Signed)
Patient declined to stay for 30 minute post iron observation. 

## 2022-09-07 DIAGNOSIS — Z124 Encounter for screening for malignant neoplasm of cervix: Secondary | ICD-10-CM | POA: Diagnosis not present

## 2022-09-07 DIAGNOSIS — D649 Anemia, unspecified: Secondary | ICD-10-CM | POA: Diagnosis not present

## 2022-09-07 DIAGNOSIS — E119 Type 2 diabetes mellitus without complications: Secondary | ICD-10-CM | POA: Diagnosis not present

## 2022-09-07 DIAGNOSIS — Z3202 Encounter for pregnancy test, result negative: Secondary | ICD-10-CM | POA: Diagnosis not present

## 2022-09-10 ENCOUNTER — Other Ambulatory Visit: Payer: Self-pay

## 2022-09-10 ENCOUNTER — Inpatient Hospital Stay: Payer: Commercial Managed Care - PPO

## 2022-09-10 VITALS — BP 133/92 | HR 78 | Temp 98.1°F | Resp 16

## 2022-09-10 DIAGNOSIS — D508 Other iron deficiency anemias: Secondary | ICD-10-CM

## 2022-09-10 DIAGNOSIS — D509 Iron deficiency anemia, unspecified: Secondary | ICD-10-CM | POA: Diagnosis not present

## 2022-09-10 MED ORDER — DIPHENHYDRAMINE HCL 25 MG PO CAPS
25.0000 mg | ORAL_CAPSULE | Freq: Once | ORAL | Status: AC
  Administered 2022-09-10: 25 mg via ORAL
  Filled 2022-09-10: qty 1

## 2022-09-10 MED ORDER — ACETAMINOPHEN 325 MG PO TABS
650.0000 mg | ORAL_TABLET | Freq: Once | ORAL | Status: AC
  Administered 2022-09-10: 650 mg via ORAL
  Filled 2022-09-10: qty 2

## 2022-09-10 MED ORDER — SODIUM CHLORIDE 0.9 % IV SOLN
300.0000 mg | Freq: Once | INTRAVENOUS | Status: AC
  Administered 2022-09-10: 300 mg via INTRAVENOUS
  Filled 2022-09-10: qty 300

## 2022-09-10 NOTE — Progress Notes (Signed)
Patient declined to stay for 30 minute post infusion observation period. Tolerated infusion well. Vital signs within normal limits on discharge. Ambulated independently to lobby.

## 2022-09-10 NOTE — Patient Instructions (Signed)

## 2022-09-24 ENCOUNTER — Emergency Department (HOSPITAL_COMMUNITY)
Admission: EM | Admit: 2022-09-24 | Discharge: 2022-09-24 | Disposition: A | Discharge status: Home / Self Care | Payer: BLUE CROSS/BLUE SHIELD | Attending: Emergency Medicine | Admitting: Emergency Medicine

## 2022-09-24 ENCOUNTER — Encounter (HOSPITAL_COMMUNITY): Payer: Self-pay

## 2022-09-24 ENCOUNTER — Emergency Department (HOSPITAL_COMMUNITY): Payer: BLUE CROSS/BLUE SHIELD

## 2022-09-24 ENCOUNTER — Other Ambulatory Visit (HOSPITAL_COMMUNITY): Payer: Self-pay

## 2022-09-24 ENCOUNTER — Encounter: Payer: Self-pay | Admitting: Hematology

## 2022-09-24 DIAGNOSIS — E119 Type 2 diabetes mellitus without complications: Secondary | ICD-10-CM | POA: Insufficient documentation

## 2022-09-24 DIAGNOSIS — H44001 Unspecified purulent endophthalmitis, right eye: Secondary | ICD-10-CM

## 2022-09-24 DIAGNOSIS — H00033 Abscess of eyelid right eye, unspecified eyelid: Secondary | ICD-10-CM | POA: Diagnosis not present

## 2022-09-24 DIAGNOSIS — H579 Unspecified disorder of eye and adnexa: Secondary | ICD-10-CM | POA: Diagnosis present

## 2022-09-24 DIAGNOSIS — Z7984 Long term (current) use of oral hypoglycemic drugs: Secondary | ICD-10-CM | POA: Diagnosis not present

## 2022-09-24 DIAGNOSIS — L0201 Cutaneous abscess of face: Secondary | ICD-10-CM | POA: Diagnosis not present

## 2022-09-24 LAB — CBC WITH DIFFERENTIAL/PLATELET
Abs Immature Granulocytes: 0.01 10*3/uL (ref 0.00–0.07)
Basophils Absolute: 0 10*3/uL (ref 0.0–0.1)
Basophils Relative: 0 %
Eosinophils Absolute: 0.1 10*3/uL (ref 0.0–0.5)
Eosinophils Relative: 1 %
HCT: 40.4 % (ref 36.0–46.0)
Hemoglobin: 12.5 g/dL (ref 12.0–15.0)
Immature Granulocytes: 0 %
Lymphocytes Relative: 18 %
Lymphs Abs: 0.9 10*3/uL (ref 0.7–4.0)
MCH: 25.5 pg — ABNORMAL LOW (ref 26.0–34.0)
MCHC: 30.9 g/dL (ref 30.0–36.0)
MCV: 82.4 fL (ref 80.0–100.0)
Monocytes Absolute: 0.4 10*3/uL (ref 0.1–1.0)
Monocytes Relative: 8 %
Neutro Abs: 3.7 10*3/uL (ref 1.7–7.7)
Neutrophils Relative %: 73 %
Platelets: 209 10*3/uL (ref 150–400)
RBC: 4.9 MIL/uL (ref 3.87–5.11)
RDW: 16.2 % — ABNORMAL HIGH (ref 11.5–15.5)
WBC: 5 10*3/uL (ref 4.0–10.5)
nRBC: 0 % (ref 0.0–0.2)

## 2022-09-24 LAB — BASIC METABOLIC PANEL
Anion gap: 6 (ref 5–15)
BUN: 7 mg/dL (ref 6–20)
CO2: 27 mmol/L (ref 22–32)
Calcium: 9.2 mg/dL (ref 8.9–10.3)
Chloride: 102 mmol/L (ref 98–111)
Creatinine, Ser: 0.76 mg/dL (ref 0.44–1.00)
GFR, Estimated: >60 mL/min (ref >60)
Glucose, Bld: 297 mg/dL — ABNORMAL HIGH (ref 70–99)
Potassium: 3.7 mmol/L (ref 3.5–5.1)
Sodium: 135 mmol/L (ref 135–145)

## 2022-09-24 LAB — I-STAT BETA HCG BLOOD, ED (MC, WL, AP ONLY): I-stat hCG, quantitative: <5 m[IU]/mL (ref <5)

## 2022-09-24 MED ORDER — TETRACAINE HCL 0.5 % OP SOLN
1.0000 [drp] | Freq: Once | OPHTHALMIC | Status: DC
  Filled 2022-09-24: qty 4

## 2022-09-24 MED ORDER — SULFAMETHOXAZOLE-TRIMETHOPRIM 800-160 MG PO TABS
1.0000 | ORAL_TABLET | Freq: Two times a day (BID) | ORAL | 0 refills | Status: AC
  Filled 2022-09-24: qty 14, 7d supply, fill #0

## 2022-09-24 MED ORDER — CEFDINIR 300 MG PO CAPS
300.0000 mg | ORAL_CAPSULE | Freq: Two times a day (BID) | ORAL | 0 refills | Status: AC
  Filled 2022-09-24: qty 14, 7d supply, fill #0

## 2022-09-24 MED ORDER — FLUORESCEIN SODIUM 1 MG OP STRP
1.0000 | ORAL_STRIP | Freq: Once | OPHTHALMIC | Status: DC
  Filled 2022-09-24: qty 1

## 2022-09-24 MED ORDER — FLUORESCEIN-BENOXINATE 0.25-0.4 % OP SOLN: 1.0000 [drp] | Freq: Once | OPHTHALMIC | Status: DC

## 2022-09-24 MED ORDER — FLUORESCEIN SODIUM 1 MG OP STRP
ORAL_STRIP | OPHTHALMIC | Status: AC
  Filled 2022-09-24: qty 1

## 2022-09-24 NOTE — ED Provider Notes (Cosign Needed Addendum)
[facility] Provider Note   CSN: [id number] Arrival date & time: 09/24/22  [id number]     History  Chief Complaint  Patient presents with   Eye Problem    Tonya Gilbert is a 37 y.o. female history of anemia requiring transfusions and diabetes presented with right eye swelling that began this morning when she woke up.  Patient states yesterday she had some swelling in the front of her right ear but today woke up with swelling around her right eye to the point where she cannot to keep it open.  Patient denies any eye pain or pain with extraocular movements.  Patient states she can still see out of her right eye and has denied any fever/chills, nausea/vomiting, headaches.  Patient stated she has a history of diabetes was no longer on diabetic medication as per her PCP.  Patient denies chest pain, shortness of breath, abdominal pain, contact use, ocular discharge, eye pruritus, recent bites  Home Medications Prior to Admission medications   Medication Sig Start Date End Date Taking? Authorizing Provider  cefdinir (OMNICEF) 300 MG capsule Take 1 capsule (300 mg total) by mouth 2 (two) times daily for 7 days. 09/24/22 10/01/22 Yes Starlin Steib, Florene Route, PA-C  sulfamethoxazole-trimethoprim (BACTRIM DS) 800-160 MG tablet Take 1 tablet by mouth 2 (two) times daily for 7 days. 09/24/22 10/01/22 Yes Darnette Lampron, Florene Route, PA-C  clindamycin (CLEOCIN) 150 MG capsule Take 1 capsule (150 mg total) by mouth 4 (four) times daily until gone 05/24/22     iron polysaccharides (FERREX 150) 150 MG capsule TAKE 1 CAPSULE BY MOUTH TWO TIMES DAILY 11/29/21 11/29/22  Brunetta Genera, MD  metFORMIN (GLUCOPHAGE) 500 MG tablet Take 1 tablet by mouth twice a day 01/30/21     metFORMIN (GLUCOPHAGE) 500 MG tablet Take 1 tablet by mouth 2 times a day for 30 day(s) 08/06/21     Multiple Vitamins-Minerals (MULTIVITAMIN) tablet Take 1 tablet by mouth daily. 10/26/15   Brunetta Genera, MD   norethindrone-ethinyl estradiol (BLISOVI FE 1/20) 1-20 MG-MCG tablet Take 1 tablet by mouth daily. 05/12/20 08/15/20  Salvadore Dom, MD      Allergies    Bee venom and Penicillins    Review of Systems   Review of Systems Right eye swelling See HPI Physical Exam Updated Vital Signs BP (!) 142/99 (BP Location: Right Arm)   Pulse 81   Temp 98.5 F (36.9 C) (Oral)   Resp 16   Ht 5\' 5"  (1.651 m)   Wt 82.1 kg   LMP 09/10/2022 (Exact Date)   SpO2 100%   BMI 30.12 kg/m  Physical Exam Constitutional:      General: She is not in acute distress. HENT:     Head:     Comments: Edema noted on the right preauricular region, no overlying skin color changes noted Head was nontender to palpation without step-off/crepitus palpated No bite marks noted Patient able to open jaw without pain    Right Ear: Tympanic membrane, ear canal and external ear normal.     Left Ear: Tympanic membrane, ear canal and external ear normal.     Nose: Nose normal.     Mouth/Throat:     Mouth: Mucous membranes are moist.     Pharynx: No posterior oropharyngeal erythema.  Eyes:     Extraocular Movements: Extraocular movements intact.     Conjunctiva/sclera: Conjunctivae normal.     Pupils: Pupils are equal, round, and reactive to light.  Comments: Right eye: Edematous without erythema, area is fluctuant, patient able to open right eye with full extraocular movements without pain, nontender to palpation Visual acuity grossly intact No nystagmus noted  Neurological:     Mental Status: She is alert.     ED Results / Procedures / Treatments   Labs (all labs ordered are listed, but only abnormal results are displayed) Labs Reviewed  BASIC METABOLIC PANEL - Abnormal; Notable for the following components:      Result Value   Glucose, Bld 297 (*)    All other components within normal limits  CBC WITH DIFFERENTIAL/PLATELET - Abnormal; Notable for the following components:   MCH 25.5 (*)    RDW  16.2 (*)    All other components within normal limits  I-STAT BETA HCG BLOOD, ED ([facility], [facility], [facility] ONLY)    EKG None  Radiology CT OrbitsS W/O CM  Result Date: 09/24/2022 CLINICAL DATA:  R eye swelling; R Preauricular edema EXAM: CT HEAD AND ORBITS WITHOUT CONTRAST TECHNIQUE: Contiguous axial images were obtained from the base of the skull through the vertex without contrast. Multidetector CT imaging of the orbits was performed using the standard protocol without intravenous contrast. RADIATION DOSE REDUCTION: This exam was performed according to the departmental dose-optimization program which includes automated exposure control, adjustment of the mA and/or kV according to patient size and/or use of iterative reconstruction technique. COMPARISON:  None Available. FINDINGS: CT HEAD FINDINGS Brain: No evidence of acute infarction, hemorrhage, hydrocephalus, extra-axial collection or mass lesion/mass effect. Vascular: No hyperdense vessel or unexpected calcification. Skull: Normal. Negative for fracture or focal lesion. Other: Elongated fluid collection in the deep subcutaneous soft tissues overlying the right temporal region superficial to the muscles of mastication with approximate measurements of 10.0 x 0.7 x 3.5 cm (series 3, image 19). Adjacent fat stranding and overlying skin thickening. CT ORBITS FINDINGS Orbits: Periorbital/preseptal soft tissue swelling/edema on the right. No postseptal inflammation or fluid. Unremarkable appearance of the left orbital structures. Visualized sinuses: Paranasal sinuses and mastoid air cells are clear. Soft tissues: Soft tissue swelling in the right facial and right periorbital region. IMPRESSION: 1. No acute intracranial abnormality. 2. Elongated fluid collection in the deep subcutaneous soft tissues overlying the right temporal region superficial to the muscles of mastication with approximate measurements of 10.0 x 0.7 x 3.5 cm. Adjacent fat stranding and overlying skin  thickening. Findings are suspicious for abscess. 3. Periorbital/preseptal soft tissue swelling/edema on the right. No postseptal inflammation or fluid. Electronically Signed   By: Davina Poke D.O.   On: 09/24/2022 08:19   CT Head Wo Contrast  Result Date: 09/24/2022 CLINICAL DATA:  R eye swelling; R Preauricular edema EXAM: CT HEAD AND ORBITS WITHOUT CONTRAST TECHNIQUE: Contiguous axial images were obtained from the base of the skull through the vertex without contrast. Multidetector CT imaging of the orbits was performed using the standard protocol without intravenous contrast. RADIATION DOSE REDUCTION: This exam was performed according to the departmental dose-optimization program which includes automated exposure control, adjustment of the mA and/or kV according to patient size and/or use of iterative reconstruction technique. COMPARISON:  None Available. FINDINGS: CT HEAD FINDINGS Brain: No evidence of acute infarction, hemorrhage, hydrocephalus, extra-axial collection or mass lesion/mass effect. Vascular: No hyperdense vessel or unexpected calcification. Skull: Normal. Negative for fracture or focal lesion. Other: Elongated fluid collection in the deep subcutaneous soft tissues overlying the right temporal region superficial to the muscles of mastication with approximate measurements of 10.0 x 0.7 x  3.5 cm (series 3, image 19). Adjacent fat stranding and overlying skin thickening. CT ORBITS FINDINGS Orbits: Periorbital/preseptal soft tissue swelling/edema on the right. No postseptal inflammation or fluid. Unremarkable appearance of the left orbital structures. Visualized sinuses: Paranasal sinuses and mastoid air cells are clear. Soft tissues: Soft tissue swelling in the right facial and right periorbital region. IMPRESSION: 1. No acute intracranial abnormality. 2. Elongated fluid collection in the deep subcutaneous soft tissues overlying the right temporal region superficial to the muscles of  mastication with approximate measurements of 10.0 x 0.7 x 3.5 cm. Adjacent fat stranding and overlying skin thickening. Findings are suspicious for abscess. 3. Periorbital/preseptal soft tissue swelling/edema on the right. No postseptal inflammation or fluid. Electronically Signed   By: Davina Poke D.O.   On: 09/24/2022 08:19    Procedures Procedures    Medications Ordered in ED Medications  tetracaine (PONTOCAINE) 0.5 % ophthalmic solution 1 drop (has no administration in time range)  fluorescein 1 MG ophthalmic strip (has no administration in time range)  fluorescein ophthalmic strip 1 strip (has no administration in time range)    ED Course/ Medical Decision Making/ A&P                             Medical Decision Making Amount and/or Complexity of Data Reviewed Labs: ordered. Radiology: ordered.  Risk Prescription drug management.   Tonya Gilbert 37 y.o. presented today for right eye swelling. Working DDx that I considered at this time includes, but not limited to, abscess/cellulitis, preorbital/orbital cellulitis, conjunctivitis, hordeolum/chalazion.  R/o DDx: Orbital Cellulitis: patient has no pain with extraocular movements Acute Glaucoma: normal pressures bilaterally HSV Infection: no reticular staining pattern Corneal abrasion/ulcer: No fluorescein uptake FB: no FB noted on exam Open Globe Rupture: normal IOP, physical exam is inconsistent Hordeolum/Chalazion: no eyelid abnormalities noted CRAO/CRVO: These are considered less likely due to history of present illness and physical exam findings  Review of prior external notes: 07/31/2022 office visit  Unique Tests and My Interpretation:  Visual Acuity: Left eye 20/40, right eye unable to assess due to swollen eye CT orbits: abscess 10.0 x 0.7 x 3.5 cm around R orbit, no subcutaneous gas, sinuses unremarkable CT head without contrast: Unremarkable BMP: Elevated glucose 297, lower than previous reading CBC:  Unremarkable I-STAT beta-hCG: Negative  Discussion with Independent Historian: None  Discussion of Management of Tests: Constance Holster, MD ENT  Risk:    Medium:  - prescription drug management  Risk Stratification Score: None  Staffed with Roderic Palau, MD  Plan: Patient presented for right eye swelling. On exam patient was in no acute distress and had stable vitals.  On exam patient had swelling over her right eye without cellulitis but was able to move her eye without pain and without nystagmus.  Patient's visual acuity is grossly intact.  Edema around the eye was nontender to palpation.  Patient also had edema in the preauricular area on the right side of her head that was nontender to palpation and without skin color changes. Due to patient history of diabetes a CT orbits was ordered to evaluate for possible periorbital/orbital cellulitis along with basic labs.  Patient denied any eye discharge with me or crusting or any eye pain or eye color changes and so fluorescein stain has not been done at this time.  Stable at this time.  Patient CT came back positive for a large abscess surrounding her right eye.  ENT will be consulted as  patient will need this abscess drained.  Patient stable this time.  ENT called back at 8:53 AM and was updated on patient.  ENT stated they would review her chart and call me back.  ENT called back and stated they reviewed the CT scan and believe patient will be safe for discharge with antibiotics and outpatient follow-up.  Patient be discharged on Bactrim and Omnicef.  Patient is documented allergy to penicillin causing itching and rash and so Omnicef will be ordered instead of Augmentin.  I spoke to the patient about CT results and my talk with the ENT specialist and our plan.  All patient's questions were answered to her satisfaction.  Patient was given return precautions. Patient stable for discharge at this time.  Patient verbalized understanding of  plan.         Final Clinical Impression(s) / ED Diagnoses Final diagnoses:  Abscess of right eye    Rx / DC Orders ED Discharge Orders          Ordered    cefdinir (OMNICEF) 300 MG capsule  2 times daily        09/24/22 0931    sulfamethoxazole-trimethoprim (BACTRIM DS) 800-160 MG tablet  2 times daily        09/24/22 0931              Chuck Hint, PA-C 09/24/22 [id number]    Milton Ferguson, MD 09/26/22 [phone]

## 2022-09-24 NOTE — ED Triage Notes (Addendum)
Pt arrived POV for right eye swelling that started a few days, today woke up with her right eye swollen shut. Pt reports some eye drainage and crust this am. Pt reports had a small "bump" to the side of her head on the right started a few days ago, appears to be starting of an abscess, swelling noted to right upper side of face. VSS, NAD noted

## 2022-09-24 NOTE — Discharge Instructions (Addendum)
Please pick up the antibiotics I have prescribed for you regarding the abscess around your eye.  Please make an appointment with the ENT specialist I have attached your as well to be seen as soon as possible regarding recent symptoms and ER visit.  If you begin to have any itching or rashes on these medications you may take Benadryl for symptoms.  If symptoms worsen please return to the ER.

## 2022-10-15 ENCOUNTER — Encounter: Payer: Self-pay | Admitting: Hematology

## 2022-10-15 ENCOUNTER — Other Ambulatory Visit (HOSPITAL_COMMUNITY): Payer: Self-pay

## 2022-11-30 ENCOUNTER — Other Ambulatory Visit (HOSPITAL_COMMUNITY): Payer: Self-pay

## 2022-11-30 ENCOUNTER — Encounter: Payer: Self-pay | Admitting: Hematology

## 2022-11-30 ENCOUNTER — Other Ambulatory Visit: Payer: Self-pay | Admitting: Hematology

## 2022-11-30 MED ORDER — POLYSACCHARIDE IRON COMPLEX 150 MG PO CAPS
150.0000 mg | ORAL_CAPSULE | Freq: Two times a day (BID) | ORAL | 5 refills | Status: DC
  Filled 2022-11-30: qty 60, 30d supply, fill #0

## 2022-12-01 ENCOUNTER — Encounter: Payer: Self-pay | Admitting: Hematology

## 2022-12-01 ENCOUNTER — Other Ambulatory Visit (HOSPITAL_COMMUNITY): Payer: Self-pay

## 2022-12-05 ENCOUNTER — Other Ambulatory Visit (HOSPITAL_COMMUNITY): Payer: Self-pay

## 2022-12-12 ENCOUNTER — Other Ambulatory Visit (HOSPITAL_COMMUNITY): Payer: Self-pay

## 2022-12-13 ENCOUNTER — Other Ambulatory Visit (HOSPITAL_COMMUNITY): Payer: Self-pay

## 2022-12-14 ENCOUNTER — Encounter: Payer: Self-pay | Admitting: Hematology

## 2022-12-14 ENCOUNTER — Other Ambulatory Visit (HOSPITAL_COMMUNITY): Payer: Self-pay

## 2022-12-14 DIAGNOSIS — R635 Abnormal weight gain: Secondary | ICD-10-CM | POA: Diagnosis not present

## 2022-12-14 DIAGNOSIS — E119 Type 2 diabetes mellitus without complications: Secondary | ICD-10-CM | POA: Diagnosis not present

## 2022-12-14 DIAGNOSIS — E785 Hyperlipidemia, unspecified: Secondary | ICD-10-CM | POA: Diagnosis not present

## 2022-12-14 DIAGNOSIS — E559 Vitamin D deficiency, unspecified: Secondary | ICD-10-CM | POA: Diagnosis not present

## 2022-12-14 DIAGNOSIS — E669 Obesity, unspecified: Secondary | ICD-10-CM | POA: Diagnosis not present

## 2022-12-14 DIAGNOSIS — D649 Anemia, unspecified: Secondary | ICD-10-CM | POA: Diagnosis not present

## 2022-12-14 DIAGNOSIS — Z0000 Encounter for general adult medical examination without abnormal findings: Secondary | ICD-10-CM | POA: Diagnosis not present

## 2022-12-14 MED ORDER — OZEMPIC (0.25 OR 0.5 MG/DOSE) 2 MG/3ML ~~LOC~~ SOPN
2.0000 mg | PEN_INJECTOR | SUBCUTANEOUS | 0 refills | Status: DC
  Filled 2022-12-14: qty 3, 28d supply, fill #0

## 2022-12-14 MED ORDER — METFORMIN HCL 500 MG PO TABS
500.0000 mg | ORAL_TABLET | Freq: Two times a day (BID) | ORAL | 1 refills | Status: DC
  Filled 2022-12-14: qty 180, 90d supply, fill #0
  Filled 2023-09-02 – 2023-10-22 (×2): qty 180, 90d supply, fill #1

## 2022-12-25 ENCOUNTER — Other Ambulatory Visit (HOSPITAL_COMMUNITY): Payer: Self-pay

## 2023-01-25 ENCOUNTER — Other Ambulatory Visit (HOSPITAL_COMMUNITY): Payer: Self-pay

## 2023-01-25 DIAGNOSIS — R635 Abnormal weight gain: Secondary | ICD-10-CM | POA: Diagnosis not present

## 2023-01-25 DIAGNOSIS — E785 Hyperlipidemia, unspecified: Secondary | ICD-10-CM | POA: Diagnosis not present

## 2023-01-25 DIAGNOSIS — E669 Obesity, unspecified: Secondary | ICD-10-CM | POA: Diagnosis not present

## 2023-01-25 DIAGNOSIS — D649 Anemia, unspecified: Secondary | ICD-10-CM | POA: Diagnosis not present

## 2023-01-25 DIAGNOSIS — E119 Type 2 diabetes mellitus without complications: Secondary | ICD-10-CM | POA: Diagnosis not present

## 2023-01-25 DIAGNOSIS — E559 Vitamin D deficiency, unspecified: Secondary | ICD-10-CM | POA: Diagnosis not present

## 2023-01-25 MED ORDER — OZEMPIC (0.25 OR 0.5 MG/DOSE) 2 MG/3ML ~~LOC~~ SOPN
0.5000 mg | PEN_INJECTOR | SUBCUTANEOUS | 0 refills | Status: DC
  Filled 2023-01-25: qty 3, 28d supply, fill #0

## 2023-01-26 ENCOUNTER — Other Ambulatory Visit (HOSPITAL_COMMUNITY): Payer: Self-pay

## 2023-01-28 ENCOUNTER — Other Ambulatory Visit (HOSPITAL_COMMUNITY): Payer: Self-pay

## 2023-01-29 ENCOUNTER — Other Ambulatory Visit (HOSPITAL_COMMUNITY): Payer: Self-pay

## 2023-02-04 ENCOUNTER — Encounter: Payer: Self-pay | Admitting: Hematology

## 2023-02-08 ENCOUNTER — Other Ambulatory Visit: Payer: Self-pay

## 2023-02-08 DIAGNOSIS — D509 Iron deficiency anemia, unspecified: Secondary | ICD-10-CM

## 2023-02-11 ENCOUNTER — Inpatient Hospital Stay: Payer: Commercial Managed Care - PPO | Attending: Hematology

## 2023-02-11 DIAGNOSIS — N920 Excessive and frequent menstruation with regular cycle: Secondary | ICD-10-CM | POA: Diagnosis not present

## 2023-02-11 DIAGNOSIS — D500 Iron deficiency anemia secondary to blood loss (chronic): Secondary | ICD-10-CM | POA: Diagnosis not present

## 2023-02-11 DIAGNOSIS — D509 Iron deficiency anemia, unspecified: Secondary | ICD-10-CM

## 2023-02-11 LAB — CBC WITH DIFFERENTIAL (CANCER CENTER ONLY)
Abs Immature Granulocytes: 0.01 10*3/uL (ref 0.00–0.07)
Basophils Absolute: 0 10*3/uL (ref 0.0–0.1)
Basophils Relative: 1 %
Eosinophils Absolute: 0 10*3/uL (ref 0.0–0.5)
Eosinophils Relative: 1 %
HCT: 28.6 % — ABNORMAL LOW (ref 36.0–46.0)
Hemoglobin: 8.6 g/dL — ABNORMAL LOW (ref 12.0–15.0)
Immature Granulocytes: 0 %
Lymphocytes Relative: 28 %
Lymphs Abs: 1.2 10*3/uL (ref 0.7–4.0)
MCH: 22.5 pg — ABNORMAL LOW (ref 26.0–34.0)
MCHC: 30.1 g/dL (ref 30.0–36.0)
MCV: 74.9 fL — ABNORMAL LOW (ref 80.0–100.0)
Monocytes Absolute: 0.3 10*3/uL (ref 0.1–1.0)
Monocytes Relative: 8 %
Neutro Abs: 2.8 10*3/uL (ref 1.7–7.7)
Neutrophils Relative %: 62 %
Platelet Count: 437 10*3/uL — ABNORMAL HIGH (ref 150–400)
RBC: 3.82 MIL/uL — ABNORMAL LOW (ref 3.87–5.11)
RDW: 15.1 % (ref 11.5–15.5)
WBC Count: 4.4 10*3/uL (ref 4.0–10.5)
nRBC: 0 % (ref 0.0–0.2)

## 2023-02-11 LAB — CMP (CANCER CENTER ONLY)
ALT: 10 U/L (ref 0–44)
AST: 11 U/L — ABNORMAL LOW (ref 15–41)
Albumin: 4.2 g/dL (ref 3.5–5.0)
Alkaline Phosphatase: 37 U/L — ABNORMAL LOW (ref 38–126)
Anion gap: 6 (ref 5–15)
BUN: 9 mg/dL (ref 6–20)
CO2: 29 mmol/L (ref 22–32)
Calcium: 9.2 mg/dL (ref 8.9–10.3)
Chloride: 105 mmol/L (ref 98–111)
Creatinine: 0.74 mg/dL (ref 0.44–1.00)
GFR, Estimated: >60 mL/min (ref >60)
Glucose, Bld: 141 mg/dL — ABNORMAL HIGH (ref 70–99)
Potassium: 4.1 mmol/L (ref 3.5–5.1)
Sodium: 140 mmol/L (ref 135–145)
Total Bilirubin: 0.2 mg/dL — ABNORMAL LOW (ref 0.3–1.2)
Total Protein: 7.3 g/dL (ref 6.5–8.1)

## 2023-02-11 LAB — IRON AND IRON BINDING CAPACITY (CC-WL,HP ONLY)
Iron: 95 ug/dL (ref 28–170)
Saturation Ratios: 20 % (ref 10.4–31.8)
TIBC: 482 ug/dL — ABNORMAL HIGH (ref 250–450)
UIBC: 387 ug/dL (ref 148–442)

## 2023-02-11 LAB — FERRITIN: Ferritin: 3 ng/mL — ABNORMAL LOW (ref 11–307)

## 2023-02-13 ENCOUNTER — Inpatient Hospital Stay (HOSPITAL_BASED_OUTPATIENT_CLINIC_OR_DEPARTMENT_OTHER): Payer: Commercial Managed Care - PPO | Admitting: Hematology

## 2023-02-13 ENCOUNTER — Other Ambulatory Visit (HOSPITAL_COMMUNITY): Payer: Self-pay

## 2023-02-13 ENCOUNTER — Encounter: Payer: Self-pay | Admitting: Hematology

## 2023-02-13 DIAGNOSIS — N920 Excessive and frequent menstruation with regular cycle: Secondary | ICD-10-CM | POA: Diagnosis not present

## 2023-02-13 DIAGNOSIS — D500 Iron deficiency anemia secondary to blood loss (chronic): Secondary | ICD-10-CM | POA: Diagnosis not present

## 2023-02-13 MED ORDER — POLYSACCHARIDE IRON COMPLEX 150 MG PO CAPS
150.0000 mg | ORAL_CAPSULE | Freq: Every day | ORAL | 5 refills | Status: DC
  Filled 2023-02-13: qty 60, 60d supply, fill #0
  Filled 2023-04-15: qty 60, 60d supply, fill #1
  Filled 2023-06-13: qty 60, 60d supply, fill #2
  Filled 2023-09-02 – 2023-09-05 (×2): qty 60, 60d supply, fill #3
  Filled 2023-10-22: qty 60, 60d supply, fill #4
  Filled 2024-02-03: qty 60, 60d supply, fill #5

## 2023-02-13 NOTE — Progress Notes (Signed)
Tonya Gilbert    HEMATOLOGY/ONCOLOGY PHONE VISIT NOTE  Date of Service: 02/13/23  PCP: Amada Kingfisher Bonsu MD  CHIEF COMPLAINTS/PURPOSE OF CONSULTATION:   Follow-up for continued evaluation and management of iron deficiency anemia.  HISTORY OF PRESENTING ILLNESS:   Please see previous note for details on initial presentation Interval History:   Tonya Gilbert is a 37 y.o. female here for continued evaluation and management of iron deficiency anemia. I last connected with her via telemedicine visit on 07/31/2022 and was doing well overall. She noted that she was recently diagnosed with diabetes mellitus by her PCP.   She presented to the ED on 09/24/2022 for abscess of the right eye.   I connected with Kathleene Hazel on 02/13/23 at  8:40 AM EDT by telephone visit and verified that I am speaking with the correct person using two identifiers.   I discussed the limitations, risks, security and privacy concerns of performing an evaluation and management service by telemedicine and the availability of in-person appointments. I also discussed with the patient that there may be a patient responsible charge related to this service. The patient expressed understanding and agreed to proceed.   Other persons participating in the visit and their role in the encounter: none   Patient's location: home  Provider's location: [facility]   Chief Complaint: continued evaluation and management of iron deficiency anemia    Today, she reports that she has been feeling well overall since her last clinical visit. She was seen by ENT for abscess of right eye which has since resolved with antibiotics.   Patient continues to follow with her OB/GYN to manage her heavy periods. She had a Mirena IUD inserted one year ago, and she reports that she no longer endorses significant blood clots, but continues to have regular menstrual cycles lasting 7 days. She does take oral iron regularly.   MEDICAL HISTORY:   #1 obesity #2  iron deficiency anemia #3 hypercholesterolemia not on medications  #4 obesity - on phentermine for weight loss #5 history of vitamin D deficiency #6 diabetes  SURGICAL HISTORY: No previous surgeries  SOCIAL HISTORY: Social History   Socioeconomic History   Marital status: Single    Spouse name: Not on file   Number of children: Not on file   Years of education: Not on file   Highest education level: Not on file  Occupational History   Not on file  Tobacco Use   Smoking status: Never   Smokeless tobacco: Never  Vaping Use   Vaping status: Never Used  Substance and Sexual Activity   Alcohol use: No   Drug use: No   Sexual activity: Not Currently  Other Topics Concern   Not on file  Social History Narrative   Not on file   Social Determinants of Health   Financial Resource Strain: Not on file  Food Insecurity: Not on file  Transportation Needs: Not on file  Physical Activity: Not on file  Stress: Not on file  Social Connections: Not on file  Intimate Partner Violence: Not on file    FAMILY HISTORY: No family history of blood disorders or cancer that she is aware of.  ALLERGIES:  is allergic to bee venom and penicillins.  MEDICATIONS:  Current Outpatient Medications  Medication Sig Dispense Refill   clindamycin (CLEOCIN) 150 MG capsule Take 1 capsule (150 mg total) by mouth 4 (four) times daily until gone 40 capsule 0   iron polysaccharides (FERREX 150) 150 MG capsule Take 1 capsule (150  mg total) by mouth 2 (two) times daily. 60 capsule 5   metFORMIN (GLUCOPHAGE) 500 MG tablet Take 1 tablet by mouth twice a day 60 tablet 0   metFORMIN (GLUCOPHAGE) 500 MG tablet Take 1 tablet by mouth 2 times a day for 30 day(s) 60 tablet 2   metFORMIN (GLUCOPHAGE) 500 MG tablet Take 1 tablet (500 mg total) by mouth 2 (two) times daily. 180 tablet 1   Multiple Vitamins-Minerals (MULTIVITAMIN) tablet Take 1 tablet by mouth daily.     Semaglutide,0.25 or 0.5MG /DOS, (OZEMPIC, 0.25  OR 0.5 MG/DOSE,) 2 MG/3ML SOPN Inject 0.5 mg into the skin once a week. 3 mL 0   No current facility-administered medications for this visit.    REVIEW OF SYSTEMS:   10 Point review of Systems was done is negative except as noted above.   PHYSICAL EXAMINATION: Telemedicine visit  LABORATORY DATA:  I have reviewed the data as listed  .    Latest Ref Rng & Units 02/11/2023    1:09 PM 09/24/2022    7:19 AM 07/30/2022    1:22 PM  CBC  WBC 4.0 - 10.5 K/uL 4.4  5.0  4.5   Hemoglobin 12.0 - 15.0 g/dL 8.6  34.7  42.5   Hematocrit 36.0 - 46.0 % 28.6  40.4  34.4   Platelets 150 - 400 K/uL 437  209  325    . CBC    Component Value Date/Time   WBC 4.4 02/11/2023 1309   WBC 5.0 09/24/2022 0719   RBC 3.82 (L) 02/11/2023 1309   HGB 8.6 (L) 02/11/2023 1309   HGB 8.6 (L) 05/12/2020 1005   HGB 12.5 10/25/2015 1608   HCT 28.6 (L) 02/11/2023 1309   HCT 27.3 (L) 05/12/2020 1005   HCT 38.2 10/25/2015 1608   PLT 437 (H) 02/11/2023 1309   PLT 262 05/12/2020 1005   MCV 74.9 (L) 02/11/2023 1309   MCV 82 05/12/2020 1005   MCV 81.3 10/25/2015 1608   MCH 22.5 (L) 02/11/2023 1309   MCHC 30.1 02/11/2023 1309   RDW 15.1 02/11/2023 1309   RDW 15.5 (H) 05/12/2020 1005   RDW 13.9 10/25/2015 1608   LYMPHSABS 1.2 02/11/2023 1309   LYMPHSABS 1.8 10/25/2015 1608   MONOABS 0.3 02/11/2023 1309   MONOABS 0.5 10/25/2015 1608   EOSABS 0.0 02/11/2023 1309   EOSABS 0.1 10/25/2015 1608   BASOSABS 0.0 02/11/2023 1309   BASOSABS 0.0 10/25/2015 1608     .    Latest Ref Rng & Units 02/11/2023    1:09 PM 09/24/2022    7:19 AM 07/30/2022    1:22 PM  CMP  Glucose 70 - 99 mg/dL 956  387  564   BUN 6 - 20 mg/dL 9  7  10    Creatinine 0.44 - 1.00 mg/dL 3.32  9.51  8.84   Sodium 135 - 145 mmol/L 140  135  130   Potassium 3.5 - 5.1 mmol/L 4.1  3.7  3.9   Chloride 98 - 111 mmol/L 105  102  98   CO2 22 - 32 mmol/L 29  27  24    Calcium 8.9 - 10.3 mg/dL 9.2  9.2  9.8   Total Protein 6.5 - 8.1 g/dL 7.3   6.7   Total  Bilirubin 0.3 - 1.2 mg/dL 0.2   0.3   Alkaline Phos 38 - 126 U/L 37   55   AST 15 - 41 U/L 11   11   ALT 0 -  44 U/L 10   10    . Lab Results  Component Value Date   IRON 95 02/11/2023   TIBC 482 (H) 02/11/2023   IRONPCTSAT 20 02/11/2023   (Iron and TIBC)  Lab Results  Component Value Date   FERRITIN 3 (L) 02/11/2023  RADIOGRAPHIC STUDIES: I have personally reviewed the radiological images as listed and agreed with the findings in the report. No results found.  ASSESSMENT & PLAN:   37 y.o. female from [place] with  #1 Iron deficiency anemia Current iron deficiency anemia due to heavy menstrual losses.  #2 mild leukopenia WBC count 3.5k  #3 menorrhagia improving with Mirena IUD placed in March 2023 per patient.  PLAN:  -Discussed lab results on 02/11/2023 in detail with patient. CBC showed WBC of 4.4K, hemoglobin of 8.6, and platelets of 437K. -hemoglobin was previously normal at 12.5 in April. Hemoglobin has since decreased to 8.6 -iron saturation 20% -ferritin level 3 (L). Discussed ferritin goal of at least 50 -iron levels nearly undetectable at this time -despite patient having IUD Mirena inserted one year ago, her menstrual cycles continue to be heavy enough to drop her hemoglobin levels.  -Patient does take oral iron which is not holding hemoglobin at this time -Dicussed the option of IV Iron infusion weekly x 3 doses, which patient is agreeable with starting  -advised patient to connect with OB/GYN to discuss optimal management of menstrual cycles -continue to follow up with PCP regarding diabetes.   FOLLOW-UP: IV venofer 300mg  weekly x 3 doses starting in 2 weeks Phone visit with Dr Candise Che in 6 months with labs the day before the phone visit    The total time spent in the appointment was 20 minutes* .  All of the patient's questions were answered with apparent satisfaction. The patient knows to call the clinic with any problems, questions or  concerns.   Wyvonnia Lora MD MS AAHIVMS [facility] Hematology/Oncology Physician [facility]  .*Total Encounter Time as defined by the Centers for Medicare and Medicaid Services includes, in addition to the face-to-face time of a patient visit (documented in the note above) non-face-to-face time: obtaining and reviewing outside history, ordering and reviewing medications, tests or procedures, care coordination (communications with other health care professionals or caregivers) and documentation in the medical record.    I,Mitra Faeizi,acting as a Neurosurgeon for Wyvonnia Lora, MD.,have documented all relevant documentation on the behalf of Wyvonnia Lora, MD,as directed by  Wyvonnia Lora, MD while in the presence of Wyvonnia Lora, MD.  .I have reviewed the above documentation for accuracy and completeness, and I agree with the above. Johney Maine MD

## 2023-02-15 ENCOUNTER — Telehealth: Payer: Self-pay | Admitting: Hematology

## 2023-02-15 NOTE — Telephone Encounter (Signed)
Left patient a message in regards to scheduling future appointments, left patient callback number to reach for scheduling

## 2023-02-19 ENCOUNTER — Encounter: Payer: Self-pay | Admitting: Hematology

## 2023-03-01 ENCOUNTER — Other Ambulatory Visit (HOSPITAL_COMMUNITY): Payer: Self-pay

## 2023-03-01 DIAGNOSIS — R635 Abnormal weight gain: Secondary | ICD-10-CM | POA: Diagnosis not present

## 2023-03-01 DIAGNOSIS — E559 Vitamin D deficiency, unspecified: Secondary | ICD-10-CM | POA: Diagnosis not present

## 2023-03-01 DIAGNOSIS — D649 Anemia, unspecified: Secondary | ICD-10-CM | POA: Diagnosis not present

## 2023-03-01 DIAGNOSIS — E119 Type 2 diabetes mellitus without complications: Secondary | ICD-10-CM | POA: Diagnosis not present

## 2023-03-01 DIAGNOSIS — E669 Obesity, unspecified: Secondary | ICD-10-CM | POA: Diagnosis not present

## 2023-03-01 DIAGNOSIS — N289 Disorder of kidney and ureter, unspecified: Secondary | ICD-10-CM | POA: Diagnosis not present

## 2023-03-01 DIAGNOSIS — E785 Hyperlipidemia, unspecified: Secondary | ICD-10-CM | POA: Diagnosis not present

## 2023-03-01 MED ORDER — OZEMPIC (1 MG/DOSE) 4 MG/3ML ~~LOC~~ SOPN
4.0000 mg | PEN_INJECTOR | SUBCUTANEOUS | 0 refills | Status: DC
  Filled 2023-03-01: qty 3, 28d supply, fill #0

## 2023-03-04 ENCOUNTER — Other Ambulatory Visit (HOSPITAL_COMMUNITY): Payer: Self-pay

## 2023-03-16 ENCOUNTER — Telehealth: Payer: Self-pay | Admitting: Hematology

## 2023-03-23 ENCOUNTER — Inpatient Hospital Stay: Payer: Commercial Managed Care - PPO | Attending: Hematology

## 2023-03-23 VITALS — BP 148/99 | HR 106 | Temp 98.5°F | Resp 17

## 2023-03-23 DIAGNOSIS — D508 Other iron deficiency anemias: Secondary | ICD-10-CM

## 2023-03-23 DIAGNOSIS — D500 Iron deficiency anemia secondary to blood loss (chronic): Secondary | ICD-10-CM | POA: Insufficient documentation

## 2023-03-23 DIAGNOSIS — D509 Iron deficiency anemia, unspecified: Secondary | ICD-10-CM

## 2023-03-23 MED ORDER — SODIUM CHLORIDE 0.9 % IV SOLN: INTRAVENOUS | Status: DC

## 2023-03-23 MED ORDER — SODIUM CHLORIDE 0.9 % IV SOLN
300.0000 mg | Freq: Once | INTRAVENOUS | Status: AC
  Administered 2023-03-23: 300 mg via INTRAVENOUS
  Filled 2023-03-23: qty 300

## 2023-03-23 MED ORDER — ACETAMINOPHEN 325 MG PO TABS
650.0000 mg | ORAL_TABLET | Freq: Once | ORAL | Status: AC
  Administered 2023-03-23: 650 mg via ORAL
  Filled 2023-03-23: qty 2

## 2023-03-23 MED ORDER — DIPHENHYDRAMINE HCL 25 MG PO CAPS
25.0000 mg | ORAL_CAPSULE | Freq: Once | ORAL | Status: AC
  Administered 2023-03-23: 25 mg via ORAL
  Filled 2023-03-23: qty 1

## 2023-03-29 ENCOUNTER — Encounter: Payer: Self-pay | Admitting: Hematology

## 2023-03-29 ENCOUNTER — Other Ambulatory Visit (HOSPITAL_COMMUNITY): Payer: Self-pay

## 2023-03-29 DIAGNOSIS — E559 Vitamin D deficiency, unspecified: Secondary | ICD-10-CM | POA: Diagnosis not present

## 2023-03-29 DIAGNOSIS — D649 Anemia, unspecified: Secondary | ICD-10-CM | POA: Diagnosis not present

## 2023-03-29 DIAGNOSIS — N289 Disorder of kidney and ureter, unspecified: Secondary | ICD-10-CM | POA: Diagnosis not present

## 2023-03-29 DIAGNOSIS — R635 Abnormal weight gain: Secondary | ICD-10-CM | POA: Diagnosis not present

## 2023-03-29 DIAGNOSIS — E785 Hyperlipidemia, unspecified: Secondary | ICD-10-CM | POA: Diagnosis not present

## 2023-03-29 DIAGNOSIS — E669 Obesity, unspecified: Secondary | ICD-10-CM | POA: Diagnosis not present

## 2023-03-29 DIAGNOSIS — E119 Type 2 diabetes mellitus without complications: Secondary | ICD-10-CM | POA: Diagnosis not present

## 2023-03-29 MED ORDER — OZEMPIC (1 MG/DOSE) 4 MG/3ML ~~LOC~~ SOPN
1.0000 mg | PEN_INJECTOR | SUBCUTANEOUS | 3 refills | Status: DC
  Filled 2023-03-29: qty 9, 84d supply, fill #0
  Filled 2023-09-02: qty 9, 84d supply, fill #1

## 2023-03-29 MED FILL — Iron Sucrose Inj 20 MG/ML (Fe Equiv): INTRAVENOUS | Qty: 15 | Status: AC

## 2023-03-30 ENCOUNTER — Inpatient Hospital Stay: Payer: Commercial Managed Care - PPO | Attending: Hematology

## 2023-03-30 VITALS — BP 142/89 | HR 83 | Temp 98.7°F | Resp 18

## 2023-03-30 DIAGNOSIS — N920 Excessive and frequent menstruation with regular cycle: Secondary | ICD-10-CM | POA: Insufficient documentation

## 2023-03-30 DIAGNOSIS — D509 Iron deficiency anemia, unspecified: Secondary | ICD-10-CM

## 2023-03-30 DIAGNOSIS — D500 Iron deficiency anemia secondary to blood loss (chronic): Secondary | ICD-10-CM | POA: Diagnosis not present

## 2023-03-30 DIAGNOSIS — D508 Other iron deficiency anemias: Secondary | ICD-10-CM

## 2023-03-30 MED ORDER — ACETAMINOPHEN 325 MG PO TABS
650.0000 mg | ORAL_TABLET | Freq: Once | ORAL | Status: AC
  Administered 2023-03-30: 650 mg via ORAL
  Filled 2023-03-30: qty 2

## 2023-03-30 MED ORDER — SODIUM CHLORIDE 0.9 % IV SOLN: Freq: Once | INTRAVENOUS | Status: AC

## 2023-03-30 MED ORDER — SODIUM CHLORIDE 0.9 % IV SOLN
300.0000 mg | Freq: Once | INTRAVENOUS | Status: AC
  Administered 2023-03-30: 300 mg via INTRAVENOUS
  Filled 2023-03-30: qty 300

## 2023-03-30 MED ORDER — DIPHENHYDRAMINE HCL 25 MG PO CAPS
25.0000 mg | ORAL_CAPSULE | Freq: Once | ORAL | Status: AC
  Administered 2023-03-30: 25 mg via ORAL
  Filled 2023-03-30: qty 1

## 2023-03-30 NOTE — Patient Instructions (Signed)
Iron Sucrose Injection What is this medication? IRON SUCROSE (EYE ern SOO krose) treats low levels of iron (iron deficiency anemia) in people with kidney disease. Iron is a mineral that plays an important role in making red blood cells, which carry oxygen from your lungs to the rest of your body. This medicine may be used for other purposes; ask your health care provider or pharmacist if you have questions. COMMON BRAND NAME(S): Venofer What should I tell my care team before I take this medication? They need to know if you have any of these conditions: Anemia not caused by low iron levels Heart disease High levels of iron in the blood Kidney disease Liver disease An unusual or allergic reaction to iron, other medications, foods, dyes, or preservatives Pregnant or trying to get pregnant Breastfeeding How should I use this medication? This medication is for infusion into a vein. It is given in a hospital or clinic setting. Talk to your care team about the use of this medication in children. While this medication may be prescribed for children as young as 2 years for selected conditions, precautions do apply. Overdosage: If you think you have taken too much of this medicine contact a poison control center or emergency room at once. NOTE: This medicine is only for you. Do not share this medicine with others. What if I miss a dose? Keep appointments for follow-up doses. It is important not to miss your dose. Call your care team if you are unable to keep an appointment. What may interact with this medication? Do not take this medication with any of the following: Deferoxamine Dimercaprol Other iron products This medication may also interact with the following: Chloramphenicol Deferasirox This list may not describe all possible interactions. Give your health care provider a list of all the medicines, herbs, non-prescription drugs, or dietary supplements you use. Also tell them if you smoke,  drink alcohol, or use illegal drugs. Some items may interact with your medicine. What should I watch for while using this medication? Visit your care team regularly. Tell your care team if your symptoms do not start to get better or if they get worse. You may need blood work done while you are taking this medication. You may need to follow a special diet. Talk to your care team. Foods that contain iron include: whole grains/cereals, dried fruits, beans, or peas, leafy green vegetables, and organ meats (liver, kidney). What side effects may I notice from receiving this medication? Side effects that you should report to your care team as soon as possible: Allergic reactions--skin rash, itching, hives, swelling of the face, lips, tongue, or throat Low blood pressure--dizziness, feeling faint or lightheaded, blurry vision Shortness of breath Side effects that usually do not require medical attention (report to your care team if they continue or are bothersome): Flushing Headache Joint pain Muscle pain Nausea Pain, redness, or irritation at injection site This list may not describe all possible side effects. Call your doctor for medical advice about side effects. You may report side effects to FDA at 1-800-FDA-1088. Where should I keep my medication? This medication is given in a hospital or clinic. It will not be stored at home. NOTE: This sheet is a summary. It may not cover all possible information. If you have questions about this medicine, talk to your doctor, pharmacist, or health care provider.  2024 Elsevier/Gold Standard (2022-11-16 00:00:00)

## 2023-03-30 NOTE — Progress Notes (Signed)
Pt declined to stay for 30 minute post observation following Venofer infusion. Pt tolerated Tx well w/out incident. VSS at discharge.  Ambulatory to lobby.

## 2023-04-01 ENCOUNTER — Other Ambulatory Visit (HOSPITAL_COMMUNITY): Payer: Self-pay

## 2023-04-04 ENCOUNTER — Other Ambulatory Visit (HOSPITAL_COMMUNITY): Payer: Self-pay

## 2023-04-05 MED FILL — Iron Sucrose Inj 20 MG/ML (Fe Equiv): INTRAVENOUS | Qty: 15 | Status: AC

## 2023-04-06 ENCOUNTER — Inpatient Hospital Stay: Payer: Commercial Managed Care - PPO

## 2023-04-06 VITALS — BP 136/89 | HR 92 | Temp 98.4°F | Resp 16

## 2023-04-06 DIAGNOSIS — N920 Excessive and frequent menstruation with regular cycle: Secondary | ICD-10-CM | POA: Diagnosis not present

## 2023-04-06 DIAGNOSIS — D500 Iron deficiency anemia secondary to blood loss (chronic): Secondary | ICD-10-CM | POA: Diagnosis not present

## 2023-04-06 DIAGNOSIS — D508 Other iron deficiency anemias: Secondary | ICD-10-CM

## 2023-04-06 DIAGNOSIS — D509 Iron deficiency anemia, unspecified: Secondary | ICD-10-CM

## 2023-04-06 MED ORDER — SODIUM CHLORIDE 0.9 % IV SOLN
300.0000 mg | Freq: Once | INTRAVENOUS | Status: AC
  Administered 2023-04-06: 300 mg via INTRAVENOUS
  Filled 2023-04-06: qty 300

## 2023-04-06 MED ORDER — DIPHENHYDRAMINE HCL 25 MG PO CAPS
25.0000 mg | ORAL_CAPSULE | Freq: Once | ORAL | Status: AC
  Administered 2023-04-06: 25 mg via ORAL
  Filled 2023-04-06: qty 1

## 2023-04-06 MED ORDER — ACETAMINOPHEN 325 MG PO TABS
650.0000 mg | ORAL_TABLET | Freq: Once | ORAL | Status: AC
  Administered 2023-04-06: 650 mg via ORAL
  Filled 2023-04-06: qty 2

## 2023-04-06 MED ORDER — SODIUM CHLORIDE 0.9 % IV SOLN: INTRAVENOUS | Status: DC

## 2023-04-06 NOTE — Patient Instructions (Signed)
Iron Sucrose Injection What is this medication? IRON SUCROSE (EYE ern SOO krose) treats low levels of iron (iron deficiency anemia) in people with kidney disease. Iron is a mineral that plays an important role in making red blood cells, which carry oxygen from your lungs to the rest of your body. This medicine may be used for other purposes; ask your health care provider or pharmacist if you have questions. COMMON BRAND NAME(S): Venofer What should I tell my care team before I take this medication? They need to know if you have any of these conditions: Anemia not caused by low iron levels Heart disease High levels of iron in the blood Kidney disease Liver disease An unusual or allergic reaction to iron, other medications, foods, dyes, or preservatives Pregnant or trying to get pregnant Breastfeeding How should I use this medication? This medication is for infusion into a vein. It is given in a hospital or clinic setting. Talk to your care team about the use of this medication in children. While this medication may be prescribed for children as young as 2 years for selected conditions, precautions do apply. Overdosage: If you think you have taken too much of this medicine contact a poison control center or emergency room at once. NOTE: This medicine is only for you. Do not share this medicine with others. What if I miss a dose? Keep appointments for follow-up doses. It is important not to miss your dose. Call your care team if you are unable to keep an appointment. What may interact with this medication? Do not take this medication with any of the following: Deferoxamine Dimercaprol Other iron products This medication may also interact with the following: Chloramphenicol Deferasirox This list may not describe all possible interactions. Give your health care provider a list of all the medicines, herbs, non-prescription drugs, or dietary supplements you use. Also tell them if you smoke,  drink alcohol, or use illegal drugs. Some items may interact with your medicine. What should I watch for while using this medication? Visit your care team regularly. Tell your care team if your symptoms do not start to get better or if they get worse. You may need blood work done while you are taking this medication. You may need to follow a special diet. Talk to your care team. Foods that contain iron include: whole grains/cereals, dried fruits, beans, or peas, leafy green vegetables, and organ meats (liver, kidney). What side effects may I notice from receiving this medication? Side effects that you should report to your care team as soon as possible: Allergic reactions--skin rash, itching, hives, swelling of the face, lips, tongue, or throat Low blood pressure--dizziness, feeling faint or lightheaded, blurry vision Shortness of breath Side effects that usually do not require medical attention (report to your care team if they continue or are bothersome): Flushing Headache Joint pain Muscle pain Nausea Pain, redness, or irritation at injection site This list may not describe all possible side effects. Call your doctor for medical advice about side effects. You may report side effects to FDA at 1-800-FDA-1088. Where should I keep my medication? This medication is given in a hospital or clinic. It will not be stored at home. NOTE: This sheet is a summary. It may not cover all possible information. If you have questions about this medicine, talk to your doctor, pharmacist, or health care provider.  2024 Elsevier/Gold Standard (2022-11-16 00:00:00)

## 2023-04-09 ENCOUNTER — Other Ambulatory Visit (HOSPITAL_COMMUNITY): Payer: Self-pay

## 2023-04-12 ENCOUNTER — Other Ambulatory Visit (HOSPITAL_COMMUNITY): Payer: Self-pay

## 2023-04-13 ENCOUNTER — Other Ambulatory Visit (HOSPITAL_COMMUNITY): Payer: Self-pay

## 2023-04-15 ENCOUNTER — Encounter: Payer: Self-pay | Admitting: Hematology

## 2023-04-15 ENCOUNTER — Other Ambulatory Visit (HOSPITAL_COMMUNITY): Payer: Self-pay

## 2023-04-24 ENCOUNTER — Emergency Department (HOSPITAL_COMMUNITY)
Admission: EM | Admit: 2023-04-24 | Discharge: 2023-04-24 | Disposition: A | Discharge status: Home / Self Care | Payer: Commercial Managed Care - PPO | Attending: Emergency Medicine | Admitting: Emergency Medicine

## 2023-04-24 ENCOUNTER — Encounter (HOSPITAL_COMMUNITY): Payer: Self-pay | Admitting: Emergency Medicine

## 2023-04-24 ENCOUNTER — Other Ambulatory Visit: Payer: Self-pay

## 2023-04-24 ENCOUNTER — Other Ambulatory Visit (HOSPITAL_COMMUNITY): Payer: Self-pay

## 2023-04-24 DIAGNOSIS — D649 Anemia, unspecified: Secondary | ICD-10-CM | POA: Insufficient documentation

## 2023-04-24 DIAGNOSIS — T7840XA Allergy, unspecified, initial encounter: Secondary | ICD-10-CM | POA: Diagnosis not present

## 2023-04-24 DIAGNOSIS — R000 Tachycardia, unspecified: Secondary | ICD-10-CM | POA: Insufficient documentation

## 2023-04-24 MED ORDER — PREDNISONE 10 MG PO TABS
20.0000 mg | ORAL_TABLET | Freq: Every day | ORAL | 0 refills | Status: DC
  Filled 2023-04-24: qty 15, 7d supply, fill #0

## 2023-04-24 MED ORDER — DIPHENHYDRAMINE HCL 50 MG/ML IJ SOLN
50.0000 mg | Freq: Once | INTRAMUSCULAR | Status: AC
  Administered 2023-04-24: 50 mg via INTRAVENOUS

## 2023-04-24 MED ORDER — FAMOTIDINE IN NACL 20-0.9 MG/50ML-% IV SOLN
20.0000 mg | Freq: Once | INTRAVENOUS | Status: AC
  Administered 2023-04-24: 20 mg via INTRAVENOUS
  Filled 2023-04-24: qty 50

## 2023-04-24 MED ORDER — METHYLPREDNISOLONE SODIUM SUCC 125 MG IJ SOLR
125.0000 mg | Freq: Once | INTRAMUSCULAR | Status: AC
  Administered 2023-04-24: 125 mg via INTRAVENOUS
  Filled 2023-04-24: qty 2

## 2023-04-24 MED ORDER — DIPHENHYDRAMINE HCL 50 MG/ML IJ SOLN
25.0000 mg | Freq: Once | INTRAMUSCULAR | Status: DC
  Filled 2023-04-24: qty 1

## 2023-04-24 NOTE — ED Triage Notes (Signed)
Patient brought from cafeteria- was eating sand which and realized it had mushrooms in it. Patient labored breathing but does not feel like her tongue is swollen.

## 2023-04-24 NOTE — ED Provider Notes (Signed)
[place] EMERGENCY DEPARTMENT AT [facility] Provider Note   CSN: [id number] Arrival date & time: 04/24/23  1316     History  Chief Complaint  Patient presents with   Allergic Reaction    Tonya Gilbert is a 37 y.o. 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  Patient states that her her sandwich and another for symptoms got switched and she took a bite and notes that there were mushrooms present.  Patient started having itching and so she was brought to the ER for evaluation.  Patient does note that she has EpiPen at home however does not use it and states that she only needs Benadryl as this has helped her in the past.  Patient denies chest pain, shortness of breath, abdominal pain, nauseous vomiting but notes that she does have a cough afterwards but does not feel that her throat is closing up or her esophagus is swelling.  Home Medications Prior to Admission medications   Medication Sig Start Date End Date Taking? Authorizing Provider  predniSONE (DELTASONE) 10 MG tablet Take 2 tablets (20 mg total) by mouth daily. 04/24/23  Yes Netta Corrigan, PA-C  clindamycin (CLEOCIN) 150 MG capsule Take 1 capsule (150 mg total) by mouth 4 (four) times daily until gone 05/24/22     iron polysaccharides (FERREX 150) 150 MG capsule Take 1 capsule (150 mg total) by mouth daily. 02/13/23   Johney Maine, MD  metFORMIN (GLUCOPHAGE) 500 MG tablet Take 1 tablet by mouth twice a day 01/30/21     metFORMIN (GLUCOPHAGE) 500 MG tablet Take 1 tablet by mouth 2 times a day for 30 day(s) 08/06/21     metFORMIN (GLUCOPHAGE) 500 MG tablet Take 1 tablet (500 mg total) by mouth 2 (two) times daily. 12/14/22     Multiple Vitamins-Minerals (MULTIVITAMIN) tablet Take 1 tablet by mouth daily. 10/26/15   Johney Maine, MD  Semaglutide, 1 MG/DOSE, (OZEMPIC, 1 MG/DOSE,) 4 MG/3ML SOPN Inject 1 mg into the skin once a week.  03/29/23     Semaglutide,0.25 or 0.5MG /DOS, (OZEMPIC, 0.25 OR 0.5 MG/DOSE,) 2 MG/3ML SOPN Inject 0.5 mg into the skin once a week. 01/25/23     norethindrone-ethinyl estradiol (BLISOVI FE 1/20) 1-20 MG-MCG tablet Take 1 tablet by mouth daily. 05/12/20 08/15/20  Romualdo Bolk, MD      Allergies    Bee venom and Penicillins    Review of Systems   Review of Systems  Physical Exam Updated Vital Signs BP (!) 141/97   Pulse 83   Temp 98.3 F (36.8 C) (Oral)   Resp 18   Ht 5\' 5"  (1.651 m)   Wt 82.1 kg   SpO2 100%   BMI 30.12 kg/m  Physical Exam Constitutional:      General: She is not in acute distress. HENT:     Head: Normocephalic and atraumatic.     Comments: No facial or neck swelling noted No rash noted    Nose: Nose normal.     Mouth/Throat:     Mouth: Mucous membranes are moist.     Pharynx: No posterior oropharyngeal erythema.     Comments: No swelling in the oropharynx noted No muffled voice Tolerating secretions Neck:     Comments: No neck swelling Cardiovascular:     Rate and Rhythm: Regular rhythm. Tachycardia present.     Pulses: Normal pulses.     Heart sounds: Normal heart sounds.  Pulmonary:     Effort: Pulmonary effort is normal. No respiratory distress.     Breath sounds: Normal breath sounds.  Abdominal:     Palpations: Abdomen is soft.     Tenderness: There is no abdominal tenderness. There is no guarding or rebound.  Musculoskeletal:        General: Normal range of motion.     Cervical back: Normal range of motion and neck supple. No tenderness.  Skin:    General: Skin is warm and dry.     Capillary Refill: Capillary refill takes less than 2 seconds.     Comments: No overlying skin color changes  Neurological:     Mental Status: She is alert and oriented to person, place, and time.  Psychiatric:        Mood and Affect: Mood normal.     ED Results / Procedures / Treatments   Labs (all labs ordered are listed, but only abnormal results  are displayed) Labs Reviewed - No data to display  EKG None  Radiology No results found.  Procedures Procedures    Medications Ordered in ED Medications  methylPREDNISolone sodium succinate (SOLU-MEDROL) 125 mg/2 mL injection 125 mg (125 mg Intravenous Given 04/24/23 1328)  diphenhydrAMINE (BENADRYL) injection 50 mg (50 mg Intravenous Given 04/24/23 1327)  famotidine (PEPCID) IVPB 20 mg premix (0 mg Intravenous Stopped 04/24/23 1441)    ED Course/ Medical Decision Making/ A&P                                 Medical Decision Making Risk Prescription drug management.   Tonya Gilbert 37 y.o. presented today for allergic reaction.  Working DDx that I considered at this time includes, but not limited to, simple allergic reaction, anaphylaxis, anaphylactic shock, airway compromise, SJS/TEN, medication induced.  R/o DDx: anaphylaxis, anaphylactic shock, airway compromise, SJS/TEN, medication induced: These are considered less likely due to history of present illness, physical exam, labs/imaging findings  Review of prior external notes: 09/24/2022 ED  Unique Tests and My Interpretation: None  Social Determinants of Health: none  Discussion with Independent Historian: None  Discussion of Management of Tests: None  Risk: Medium: prescription drug management  Risk Stratification Score: none  Plan: On exam patient was transposed mildly tachycardic at 101 in the room.  Patient's exam shows that she is clear lungs auscultation bilaterally with no wheezing muffled voice or facial or neck swelling or rashes.  Patient states that she normally gets Benadryl and this resolved her symptoms as she only had a bite of her sandwich that included mushrooms.  Patient states that she does not have any shortness of breath chest pain or wheezing and does not normally need an EpiPen and does not want EpiPen at this time as she gets better with Benadryl.  Patient was given Benadryl and  Solu-Medrol by the triage staff however I will add on Pepcid for any abdominal discomfort patient may have.  Will monitor.  On recheck after receiving the Pepcid patient states that her cough is resolved she is completely asymptomatic.  Will continue to monitor until is been her past time of ingestion and patient states symptomatic will discharge with prednisone and have her follow-up with her primary care provider.  Patient states her her EpiPen is not out of date and does not need a refill.  On recheck patient states that she wants to be discharged she feels completely asymptomatic.  Will  discharge her primary care follow-up and prednisone as per the plan above.  Patient declined work note at this time.  Patient was given return precautions. Patient stable for discharge at this time.  Patient verbalized understanding of plan.  This chart was dictated using voice recognition software.  Despite best efforts to proofread,  errors can occur which can change the documentation meaning.         Final Clinical Impression(s) / ED Diagnoses Final diagnoses:  Allergic reaction, initial encounter    Rx / DC Orders ED Discharge Orders          Ordered    predniSONE (DELTASONE) 10 MG tablet  Daily        04/24/23 1413              Netta Corrigan, [place] 04/24/23 1504    Rolan Bucco, MD 04/24/23 1510

## 2023-04-24 NOTE — Discharge Instructions (Signed)
Please pick up the steroids I prescribed and follow-up with your primary care provider regards resumes ER visit.  Today you are given medications for an allergic reaction and improved.  Please use your EpiPen at home if you can have trouble breathing or facial or neck swelling.  If symptoms change or worsen please return to ER.

## 2023-04-24 NOTE — ED Provider Triage Note (Signed)
Emergency Medicine Provider Triage Evaluation Note  Tonya Gilbert , a 37 y.o. female  was evaluated in triage.  Pt complains of allergic reaction.  Review of Systems  Positive:  Negative:   Physical Exam  BP (!) 178/147 (BP Location: Right Arm)   Pulse (!) 106   Temp 98.4 F (36.9 C) (Oral)   Resp (!) 25   Ht 5\' 5"  (1.651 m)   Wt 82.1 kg   SpO2 100%   BMI 30.12 kg/m  Gen:   Awake, no distress   Resp:  Normal effort  MSK:   Moves extremities without difficulty  Other:    Medical Decision Making  Medically screening exam initiated at 1:19 PM.  Appropriate orders placed.  Tonya Gilbert was informed that the remainder of the evaluation will be completed by another provider, this initial triage assessment does not replace that evaluation, and the importance of remaining in the ED until their evaluation is complete.  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 Started eating a sandwich today and states that the sandwich has mushrooms in it. States that she usually takes benadryl when this happens but she does not currently have any benadryl. Providing patient with benadryl and steroids. Patient stating that she usually takes 50mg  of benadryl.  Patient denying throat swelling - states that her allergic reaction involves a lot of coughing. Currently 100% on RA. No angioedema. Lungs CTABL.   Patient will be moved back to room ASAP.   Tonya Gilbert F, [place] 04/24/23 1324

## 2023-04-25 ENCOUNTER — Other Ambulatory Visit (HOSPITAL_COMMUNITY): Payer: Self-pay

## 2023-06-13 ENCOUNTER — Other Ambulatory Visit (HOSPITAL_COMMUNITY): Payer: Self-pay

## 2023-06-13 ENCOUNTER — Encounter: Payer: Self-pay | Admitting: Hematology

## 2023-06-28 ENCOUNTER — Encounter: Payer: Self-pay | Admitting: Hematology

## 2023-06-28 ENCOUNTER — Other Ambulatory Visit (HOSPITAL_BASED_OUTPATIENT_CLINIC_OR_DEPARTMENT_OTHER): Payer: Self-pay

## 2023-06-28 ENCOUNTER — Other Ambulatory Visit (HOSPITAL_COMMUNITY): Payer: Self-pay

## 2023-06-28 DIAGNOSIS — E559 Vitamin D deficiency, unspecified: Secondary | ICD-10-CM | POA: Diagnosis not present

## 2023-06-28 DIAGNOSIS — E669 Obesity, unspecified: Secondary | ICD-10-CM | POA: Diagnosis not present

## 2023-06-28 DIAGNOSIS — R635 Abnormal weight gain: Secondary | ICD-10-CM | POA: Diagnosis not present

## 2023-06-28 DIAGNOSIS — E119 Type 2 diabetes mellitus without complications: Secondary | ICD-10-CM | POA: Diagnosis not present

## 2023-06-28 DIAGNOSIS — E785 Hyperlipidemia, unspecified: Secondary | ICD-10-CM | POA: Diagnosis not present

## 2023-06-28 DIAGNOSIS — D649 Anemia, unspecified: Secondary | ICD-10-CM | POA: Diagnosis not present

## 2023-06-28 MED ORDER — OZEMPIC (2 MG/DOSE) 8 MG/3ML ~~LOC~~ SOPN
2.0000 mg | PEN_INJECTOR | SUBCUTANEOUS | 0 refills | Status: DC
  Filled 2023-06-28: qty 3, 28d supply, fill #0

## 2023-07-01 ENCOUNTER — Other Ambulatory Visit (HOSPITAL_COMMUNITY): Payer: Self-pay

## 2023-07-05 ENCOUNTER — Other Ambulatory Visit (HOSPITAL_COMMUNITY): Payer: Self-pay

## 2023-07-26 ENCOUNTER — Other Ambulatory Visit (HOSPITAL_COMMUNITY): Payer: Self-pay

## 2023-07-26 DIAGNOSIS — E559 Vitamin D deficiency, unspecified: Secondary | ICD-10-CM | POA: Diagnosis not present

## 2023-07-26 DIAGNOSIS — E119 Type 2 diabetes mellitus without complications: Secondary | ICD-10-CM | POA: Diagnosis not present

## 2023-07-26 DIAGNOSIS — D649 Anemia, unspecified: Secondary | ICD-10-CM | POA: Diagnosis not present

## 2023-07-26 DIAGNOSIS — E669 Obesity, unspecified: Secondary | ICD-10-CM | POA: Diagnosis not present

## 2023-07-26 DIAGNOSIS — R635 Abnormal weight gain: Secondary | ICD-10-CM | POA: Diagnosis not present

## 2023-07-26 DIAGNOSIS — E785 Hyperlipidemia, unspecified: Secondary | ICD-10-CM | POA: Diagnosis not present

## 2023-07-26 MED ORDER — OZEMPIC (2 MG/DOSE) 8 MG/3ML ~~LOC~~ SOPN
2.0000 mg | PEN_INJECTOR | SUBCUTANEOUS | 2 refills | Status: DC
  Filled 2023-07-26: qty 3, 28d supply, fill #0
  Filled 2023-09-02: qty 3, 28d supply, fill #1
  Filled 2023-09-28: qty 3, 28d supply, fill #2

## 2023-07-26 MED ORDER — ROSUVASTATIN CALCIUM 20 MG PO TABS
20.0000 mg | ORAL_TABLET | Freq: Every day | ORAL | 1 refills | Status: DC
  Filled 2023-07-26: qty 90, 90d supply, fill #0
  Filled 2023-12-12: qty 90, 90d supply, fill #1

## 2023-08-02 ENCOUNTER — Other Ambulatory Visit: Payer: Self-pay

## 2023-08-02 DIAGNOSIS — D508 Other iron deficiency anemias: Secondary | ICD-10-CM

## 2023-08-02 DIAGNOSIS — D509 Iron deficiency anemia, unspecified: Secondary | ICD-10-CM

## 2023-08-02 DIAGNOSIS — D500 Iron deficiency anemia secondary to blood loss (chronic): Secondary | ICD-10-CM

## 2023-08-05 ENCOUNTER — Ambulatory Visit
Admission: EM | Admit: 2023-08-05 | Discharge: 2023-08-05 | Disposition: A | Discharge status: Home / Self Care | Payer: Commercial Managed Care - PPO | Attending: Family Medicine | Admitting: Family Medicine

## 2023-08-05 ENCOUNTER — Other Ambulatory Visit: Payer: Self-pay

## 2023-08-05 ENCOUNTER — Other Ambulatory Visit (HOSPITAL_COMMUNITY): Payer: Self-pay

## 2023-08-05 ENCOUNTER — Inpatient Hospital Stay: Payer: Commercial Managed Care - PPO | Attending: Hematology

## 2023-08-05 DIAGNOSIS — J189 Pneumonia, unspecified organism: Secondary | ICD-10-CM | POA: Diagnosis not present

## 2023-08-05 DIAGNOSIS — B349 Viral infection, unspecified: Secondary | ICD-10-CM

## 2023-08-05 DIAGNOSIS — D500 Iron deficiency anemia secondary to blood loss (chronic): Secondary | ICD-10-CM | POA: Insufficient documentation

## 2023-08-05 DIAGNOSIS — D509 Iron deficiency anemia, unspecified: Secondary | ICD-10-CM

## 2023-08-05 DIAGNOSIS — D508 Other iron deficiency anemias: Secondary | ICD-10-CM

## 2023-08-05 DIAGNOSIS — N920 Excessive and frequent menstruation with regular cycle: Secondary | ICD-10-CM | POA: Diagnosis not present

## 2023-08-05 DIAGNOSIS — D72819 Decreased white blood cell count, unspecified: Secondary | ICD-10-CM | POA: Insufficient documentation

## 2023-08-05 LAB — CBC WITH DIFFERENTIAL (CANCER CENTER ONLY)
Abs Immature Granulocytes: 0 10*3/uL (ref 0.00–0.07)
Basophils Absolute: 0 10*3/uL (ref 0.0–0.1)
Basophils Relative: 0 %
Eosinophils Absolute: 0.1 10*3/uL (ref 0.0–0.5)
Eosinophils Relative: 3 %
HCT: 33.4 % — ABNORMAL LOW (ref 36.0–46.0)
Hemoglobin: 10.2 g/dL — ABNORMAL LOW (ref 12.0–15.0)
Immature Granulocytes: 0 %
Lymphocytes Relative: 34 %
Lymphs Abs: 1.1 10*3/uL (ref 0.7–4.0)
MCH: 23.6 pg — ABNORMAL LOW (ref 26.0–34.0)
MCHC: 30.5 g/dL (ref 30.0–36.0)
MCV: 77.3 fL — ABNORMAL LOW (ref 80.0–100.0)
Monocytes Absolute: 0.3 10*3/uL (ref 0.1–1.0)
Monocytes Relative: 10 %
Neutro Abs: 1.6 10*3/uL — ABNORMAL LOW (ref 1.7–7.7)
Neutrophils Relative %: 53 %
Platelet Count: 325 10*3/uL (ref 150–400)
RBC: 4.32 MIL/uL (ref 3.87–5.11)
RDW: 13.4 % (ref 11.5–15.5)
WBC Count: 3.1 10*3/uL — ABNORMAL LOW (ref 4.0–10.5)
nRBC: 0 % (ref 0.0–0.2)

## 2023-08-05 LAB — CMP (CANCER CENTER ONLY)
ALT: 14 U/L (ref 0–44)
AST: 16 U/L (ref 15–41)
Albumin: 4.2 g/dL (ref 3.5–5.0)
Alkaline Phosphatase: 43 U/L (ref 38–126)
Anion gap: 5 (ref 5–15)
BUN: 9 mg/dL (ref 6–20)
CO2: 29 mmol/L (ref 22–32)
Calcium: 9.6 mg/dL (ref 8.9–10.3)
Chloride: 104 mmol/L (ref 98–111)
Creatinine: 0.98 mg/dL (ref 0.44–1.00)
GFR, Estimated: >60 mL/min (ref >60)
Glucose, Bld: 117 mg/dL — ABNORMAL HIGH (ref 70–99)
Potassium: 3.2 mmol/L — ABNORMAL LOW (ref 3.5–5.1)
Sodium: 138 mmol/L (ref 135–145)
Total Bilirubin: 0.2 mg/dL (ref 0.0–1.2)
Total Protein: 7.4 g/dL (ref 6.5–8.1)

## 2023-08-05 LAB — IRON AND IRON BINDING CAPACITY (CC-WL,HP ONLY)
Iron: 16 ug/dL — ABNORMAL LOW (ref 28–170)
Saturation Ratios: 4 % — ABNORMAL LOW (ref 10.4–31.8)
TIBC: 435 ug/dL (ref 250–450)
UIBC: 419 ug/dL (ref 148–442)

## 2023-08-05 LAB — FERRITIN: Ferritin: 13 ng/mL (ref 11–307)

## 2023-08-05 MED ORDER — PROMETHAZINE-DM 6.25-15 MG/5ML PO SYRP
5.0000 mL | ORAL_SOLUTION | Freq: Three times a day (TID) | ORAL | 0 refills | Status: DC | PRN
  Filled 2023-08-05: qty 200, 14d supply, fill #0

## 2023-08-05 MED ORDER — AZITHROMYCIN 250 MG PO TABS
ORAL_TABLET | ORAL | 0 refills | Status: DC
  Filled 2023-08-05: qty 6, 5d supply, fill #0

## 2023-08-05 MED ORDER — CEFDINIR 300 MG PO CAPS
300.0000 mg | ORAL_CAPSULE | Freq: Two times a day (BID) | ORAL | 0 refills | Status: DC
  Filled 2023-08-05: qty 20, 10d supply, fill #0

## 2023-08-05 NOTE — ED Triage Notes (Signed)
 Pt is here with cough and bodyaches that started Thursday, pt has taken OTC meds to relieve discomfort.

## 2023-08-05 NOTE — ED Provider Notes (Signed)
[facility]  Note:  This document was prepared using Conservation officer, historic buildings and may include unintentional dictation errors.  MRN: [id number] DOB: 02/14/1986  Subjective:   Tonya Gilbert is a 38 y.o. female presenting for 4-day history of acute onset malaise, coughing, body pains.  Has used over-the-counter medications with minimal relief.  No current facility-administered medications for this encounter.  Current Outpatient Medications:    metFORMIN (GLUCOPHAGE) 500 MG tablet, Take by mouth., Disp: , Rfl:    clindamycin (CLEOCIN) 150 MG capsule, Take 1 capsule (150 mg total) by mouth 4 (four) times daily until gone, Disp: 40 capsule, Rfl: 0   iron polysaccharides (FERREX 150) 150 MG capsule, Take 1 capsule (150 mg total) by mouth daily., Disp: 60 capsule, Rfl: 5   metFORMIN (GLUCOPHAGE) 500 MG tablet, Take 1 tablet by mouth twice a day, Disp: 60 tablet, Rfl: 0   metFORMIN (GLUCOPHAGE) 500 MG tablet, Take 1 tablet by mouth 2 times a day for 30 day(s), Disp: 60 tablet, Rfl: 2   metFORMIN (GLUCOPHAGE) 500 MG tablet, Take 1 tablet (500 mg total) by mouth 2 (two) times daily., Disp: 180 tablet, Rfl: 1   Multiple Vitamins-Minerals (MULTIVITAMIN) tablet, Take 1 tablet by mouth daily., Disp: , Rfl:    predniSONE (DELTASONE) 10 MG tablet, Take 2 tablets (20 mg total) by mouth daily., Disp: 15 tablet, Rfl: 0   rosuvastatin (CRESTOR) 20 MG tablet, Take 1 tablet (20 mg total) by mouth daily., Disp: 90 tablet, Rfl: 1   Semaglutide, 1 MG/DOSE, (OZEMPIC, 1 MG/DOSE,) 4 MG/3ML SOPN, Inject 1 mg into the skin once a week., Disp: 12 mL, Rfl: 3   Semaglutide, 2 MG/DOSE, (OZEMPIC, 2 MG/DOSE,) 8 MG/3ML SOPN, Inject 2 mg into the skin once a week., Disp: 3 mL, Rfl: 2   Semaglutide,0.25 or 0.5MG /DOS, (OZEMPIC, 0.25 OR 0.5 MG/DOSE,) 2 MG/3ML SOPN, Inject 0.5 mg into the skin once a week., Disp: 3 mL, Rfl: 0   Allergies  Allergen Reactions   Bee Venom Anaphylaxis    Penicillins Itching and Rash    Past Medical History:  Diagnosis Date   Anemia      History reviewed. No pertinent surgical history.  Family History  Problem Relation Age of Onset   Hypertension Mother     Social History   Tobacco Use   Smoking status: Never   Smokeless tobacco: Never  Vaping Use   Vaping status: Never Used  Substance Use Topics   Alcohol use: No   Drug use: No    ROS   Objective:   Vitals: BP (!) 148/98 (BP Location: Right Arm) Comment: notified provider  Pulse 75   Temp 99.1 F (37.3 C) (Oral)   Resp 17   LMP 07/17/2023 (Exact Date)   SpO2 98%   Physical Exam Constitutional:      General: She is not in acute distress.    Appearance: Normal appearance. She is well-developed and normal weight. She is not ill-appearing, toxic-appearing or diaphoretic.  HENT:     Head: Normocephalic and atraumatic.     Right Ear: Tympanic membrane, ear canal and external ear normal. No drainage or tenderness. No middle ear effusion. There is no impacted cerumen. Tympanic membrane is not erythematous or bulging.     Left Ear: Tympanic membrane, ear canal and external ear normal. No drainage or tenderness.  No middle ear effusion. There is no impacted cerumen. Tympanic membrane is not erythematous or bulging.     Nose:  Nose normal. No congestion or rhinorrhea.     Mouth/Throat:     Mouth: Mucous membranes are moist. No oral lesions.     Pharynx: No pharyngeal swelling, oropharyngeal exudate, posterior oropharyngeal erythema or uvula swelling.     Tonsils: No tonsillar exudate or tonsillar abscesses.  Eyes:     General: No scleral icterus.       Right eye: No discharge.        Left eye: No discharge.     Extraocular Movements: Extraocular movements intact.     Right eye: Normal extraocular motion.     Left eye: Normal extraocular motion.     Conjunctiva/sclera: Conjunctivae normal.  Cardiovascular:     Rate and Rhythm: Normal rate and regular rhythm.      Heart sounds: Normal heart sounds. No murmur heard.    No friction rub. No gallop.  Pulmonary:     Effort: Pulmonary effort is normal. No respiratory distress.     Breath sounds: No stridor. Examination of the right-middle field reveals rales. Examination of the right-lower field reveals rales. Rales present. No wheezing or rhonchi.  Chest:     Chest wall: No tenderness.  Musculoskeletal:     Cervical back: Normal range of motion and neck supple.  Lymphadenopathy:     Cervical: No cervical adenopathy.  Skin:    General: Skin is warm and dry.  Neurological:     General: No focal deficit present.     Mental Status: She is alert and oriented to person, place, and time.  Psychiatric:        Mood and Affect: Mood normal.        Behavior: Behavior normal.     Results for orders placed or performed in visit on 08/05/23 (from the past 24 hours)  Iron and Iron Binding Capacity ([facility] only)     Status: Abnormal   Collection Time: 08/05/23  1:40 PM  Result Value Ref Range   Iron 16 (L) 28 - 170 ug/dL   TIBC 811 914 - 782 ug/dL   Saturation Ratios 4 (L) 10.4 - 31.8 %   UIBC 419 148 - 442 ug/dL  CMP ([facility]cer Center only)     Status: Abnormal   Collection Time: 08/05/23  1:40 PM  Result Value Ref Range   Sodium 138 135 - 145 mmol/L   Potassium 3.2 (L) 3.5 - 5.1 mmol/L   Chloride 104 98 - 111 mmol/L   CO2 29 22 - 32 mmol/L   Glucose, Bld 117 (H) 70 - 99 mg/dL   BUN 9 6 - 20 mg/dL   Creatinine 9.56 2.13 - 1.00 mg/dL   Calcium 9.6 8.9 - 08.6 mg/dL   Total Protein 7.4 6.5 - 8.1 g/dL   Albumin 4.2 3.5 - 5.0 g/dL   AST 16 15 - 41 U/L   ALT 14 0 - 44 U/L   Alkaline Phosphatase 43 38 - 126 U/L   Total Bilirubin 0.2 0.0 - 1.2 mg/dL   GFR, Estimated >57 >84 mL/min   Anion gap 5 5 - 15  CBC with Differential (Cancer Center Only)     Status: Abnormal   Collection Time: 08/05/23  1:40 PM  Result Value Ref Range   WBC Count 3.1 (L) 4.0 - 10.5 K/uL   RBC 4.32 3.87 - 5.11 MIL/uL    Hemoglobin 10.2 (L) 12.0 - 15.0 g/dL   HCT 69.6 (L) 29.5 - 28.4 %   MCV 77.3 (L) 80.0 - 100.0 fL  MCH 23.6 (L) 26.0 - 34.0 pg   MCHC 30.5 30.0 - 36.0 g/dL   RDW 60.4 54.0 - 98.1 %   Platelet Count 325 150 - 400 K/uL   nRBC 0.0 0.0 - 0.2 %   Neutrophils Relative % 53 %   Neutro Abs 1.6 (L) 1.7 - 7.7 K/uL   Lymphocytes Relative 34 %   Lymphs Abs 1.1 0.7 - 4.0 K/uL   Monocytes Relative 10 %   Monocytes Absolute 0.3 0.1 - 1.0 K/uL   Eosinophils Relative 3 %   Eosinophils Absolute 0.1 0.0 - 0.5 K/uL   Basophils Relative 0 %   Basophils Absolute 0.0 0.0 - 0.1 K/uL   Immature Granulocytes 0 %   Abs Immature Granulocytes 0.00 0.00 - 0.07 K/uL    Assessment and Plan :   PDMP not reviewed this encounter.  1. Pneumonia of right lower lobe due to infectious organism   2. Acute viral syndrome    Deferred testing given timeline of illness and pulmonary exam.  Will cover for pneumonia of the right lower lobe with cefdinir, azithromycin.  Recommend supportive care otherwise.  Counseled patient on potential for adverse effects with medications prescribed/recommended today, ER and return-to-clinic precautions discussed, patient verbalized understanding.    Wallis Bamberg, [place] 08/06/23 [phone]

## 2023-08-07 ENCOUNTER — Encounter: Payer: Self-pay | Admitting: Hematology

## 2023-08-07 ENCOUNTER — Inpatient Hospital Stay (HOSPITAL_BASED_OUTPATIENT_CLINIC_OR_DEPARTMENT_OTHER): Payer: Commercial Managed Care - PPO | Admitting: Hematology

## 2023-08-07 DIAGNOSIS — D72819 Decreased white blood cell count, unspecified: Secondary | ICD-10-CM | POA: Diagnosis not present

## 2023-08-07 DIAGNOSIS — D500 Iron deficiency anemia secondary to blood loss (chronic): Secondary | ICD-10-CM | POA: Diagnosis not present

## 2023-08-07 DIAGNOSIS — N920 Excessive and frequent menstruation with regular cycle: Secondary | ICD-10-CM | POA: Diagnosis not present

## 2023-08-07 NOTE — Progress Notes (Signed)
 HEMATOLOGY/ONCOLOGY PHONE VISIT NOTE  Date of Service: 08/07/23  PCP: Amada Kingfisher Bonsu MD  CHIEF COMPLAINTS/PURPOSE OF CONSULTATION:   Follow-up for continued evaluation and management of iron deficiency anemia.  HISTORY OF PRESENTING ILLNESS:   Please see previous note for details on initial presentation Interval History:   Tonya Gilbert is a 38 y.o. female here for continued evaluation and management of iron deficiency anemia. I last connected with her via telemedicine visit on 02/13/2023 and reported normal menstrual cycles managed with Mirena IUD.   I connected with Kathleene Hazel on 08/07/23 at  3:30 PM EST by telephone visit and verified that I am speaking with the correct person using two identifiers.   I discussed the limitations, risks, security and privacy concerns of performing an evaluation and management service by telemedicine and the availability of in-person appointments. I also discussed with the patient that there may be a patient responsible charge related to this service. The patient expressed understanding and agreed to proceed.   Other persons participating in the visit and their role in the encounter: none   Patient's location: home  Provider's location: [facility]   Chief Complaint: continued evaluation and management of iron deficiency anemia    Today, she reports that she has been feeling well overall since her last iron infusion. However she reports an active bacterial Pneumonia infection at this time. She denies any fever at this time. Patient has been taking two antibiotics, Omnicef and Z-Pak, since Monday, 08/05/2023.   Patient reports that she does have Mirena IUD in place which has improved her heavy menstrual flow, but has not stopped her periods. Her menstrual cycles typically last five days.   The results of her recent lab workup was discussed with her in detail.   MEDICAL HISTORY:   #1 obesity #2 iron deficiency anemia #3  hypercholesterolemia not on medications  #4 obesity - on phentermine for weight loss #5 history of vitamin D deficiency #6 diabetes  SURGICAL HISTORY: No previous surgeries  SOCIAL HISTORY: Social History   Socioeconomic History   Marital status: Single    Spouse name: Not on file   Number of children: Not on file   Years of education: Not on file   Highest education level: Not on file  Occupational History   Not on file  Tobacco Use   Smoking status: Never   Smokeless tobacco: Never  Vaping Use   Vaping status: Never Used  Substance and Sexual Activity   Alcohol use: No   Drug use: No   Sexual activity: Not Currently  Other Topics Concern   Not on file  Social History Narrative   Not on file   Social Drivers of Health   Financial Resource Strain: Not on file  Food Insecurity: Not on file  Transportation Needs: Not on file  Physical Activity: Not on file  Stress: Not on file  Social Connections: Not on file  Intimate Partner Violence: Not on file    FAMILY HISTORY: No family history of blood disorders or cancer that she is aware of.  ALLERGIES:  is allergic to bee venom and penicillins.  MEDICATIONS:  Current Outpatient Medications  Medication Sig Dispense Refill   azithromycin (ZITHROMAX) 250 MG tablet Take 2 tablets by mouth on day 1 then take 1 tablet daily for 4 days 6 tablet 0   cefdinir (OMNICEF) 300 MG capsule Take 1 capsule (300 mg total) by mouth 2 (two) times daily. 20 capsule 0   clindamycin (  CLEOCIN) 150 MG capsule Take 1 capsule (150 mg total) by mouth 4 (four) times daily until gone 40 capsule 0   iron polysaccharides (FERREX 150) 150 MG capsule Take 1 capsule (150 mg total) by mouth daily. 60 capsule 5   metFORMIN (GLUCOPHAGE) 500 MG tablet Take 1 tablet by mouth twice a day 60 tablet 0   metFORMIN (GLUCOPHAGE) 500 MG tablet Take 1 tablet by mouth 2 times a day for 30 day(s) 60 tablet 2   metFORMIN (GLUCOPHAGE) 500 MG tablet Take 1 tablet (500  mg total) by mouth 2 (two) times daily. 180 tablet 1   metFORMIN (GLUCOPHAGE) 500 MG tablet Take by mouth.     Multiple Vitamins-Minerals (MULTIVITAMIN) tablet Take 1 tablet by mouth daily.     predniSONE (DELTASONE) 10 MG tablet Take 2 tablets (20 mg total) by mouth daily. 15 tablet 0   promethazine-dextromethorphan (PROMETHAZINE-DM) 6.25-15 MG/5ML syrup Take 5 mLs by mouth 3 (three) times daily as needed for cough. 200 mL 0   rosuvastatin (CRESTOR) 20 MG tablet Take 1 tablet (20 mg total) by mouth daily. 90 tablet 1   Semaglutide, 1 MG/DOSE, (OZEMPIC, 1 MG/DOSE,) 4 MG/3ML SOPN Inject 1 mg into the skin once a week. 12 mL 3   Semaglutide, 2 MG/DOSE, (OZEMPIC, 2 MG/DOSE,) 8 MG/3ML SOPN Inject 2 mg into the skin once a week. 3 mL 2   Semaglutide,0.25 or 0.5MG /DOS, (OZEMPIC, 0.25 OR 0.5 MG/DOSE,) 2 MG/3ML SOPN Inject 0.5 mg into the skin once a week. 3 mL 0   No current facility-administered medications for this visit.    REVIEW OF SYSTEMS:    10 Point review of Systems was done is negative except as noted above.   PHYSICAL EXAMINATION: Telemedicine visit  LABORATORY DATA:  I have reviewed the data as listed  .    Latest Ref Rng & Units 08/05/2023    1:40 PM 02/11/2023    1:09 PM 09/24/2022    7:19 AM  CBC  WBC 4.0 - 10.5 K/uL 3.1  4.4  5.0   Hemoglobin 12.0 - 15.0 g/dL 16.1  8.6  09.6   Hematocrit 36.0 - 46.0 % 33.4  28.6  40.4   Platelets 150 - 400 K/uL 325  437  209    . CBC    Component Value Date/Time   WBC 3.1 (L) 08/05/2023 1340   WBC 5.0 09/24/2022 0719   RBC 4.32 08/05/2023 1340   HGB 10.2 (L) 08/05/2023 1340   HGB 8.6 (L) 05/12/2020 1005   HGB 12.5 10/25/2015 1608   HCT 33.4 (L) 08/05/2023 1340   HCT 27.3 (L) 05/12/2020 1005   HCT 38.2 10/25/2015 1608   PLT 325 08/05/2023 1340   PLT 262 05/12/2020 1005   MCV 77.3 (L) 08/05/2023 1340   MCV 82 05/12/2020 1005   MCV 81.3 10/25/2015 1608   MCH 23.6 (L) 08/05/2023 1340   MCHC 30.5 08/05/2023 1340   RDW 13.4  08/05/2023 1340   RDW 15.5 (H) 05/12/2020 1005   RDW 13.9 10/25/2015 1608   LYMPHSABS 1.1 08/05/2023 1340   LYMPHSABS 1.8 10/25/2015 1608   MONOABS 0.3 08/05/2023 1340   MONOABS 0.5 10/25/2015 1608   EOSABS 0.1 08/05/2023 1340   EOSABS 0.1 10/25/2015 1608   BASOSABS 0.0 08/05/2023 1340   BASOSABS 0.0 10/25/2015 1608     .    Latest Ref Rng & Units 08/05/2023    1:40 PM 02/11/2023    1:09 PM 09/24/2022    7:19  AM  CMP  Glucose 70 - 99 mg/dL 409  811  914   BUN 6 - 20 mg/dL 9  9  7    Creatinine 0.44 - 1.00 mg/dL 7.82  9.56  2.13   Sodium 135 - 145 mmol/L 138  140  135   Potassium 3.5 - 5.1 mmol/L 3.2  4.1  3.7   Chloride 98 - 111 mmol/L 104  105  102   CO2 22 - 32 mmol/L 29  29  27    Calcium 8.9 - 10.3 mg/dL 9.6  9.2  9.2   Total Protein 6.5 - 8.1 g/dL 7.4  7.3    Total Bilirubin 0.0 - 1.2 mg/dL 0.2  0.2    Alkaline Phos 38 - 126 U/L 43  37    AST 15 - 41 U/L 16  11    ALT 0 - 44 U/L 14  10     . Lab Results  Component Value Date   IRON 16 (L) 08/05/2023   TIBC 435 08/05/2023   IRONPCTSAT 4 (L) 08/05/2023   (Iron and TIBC)  Lab Results  Component Value Date   FERRITIN 13 08/05/2023   RADIOGRAPHIC STUDIES: I have personally reviewed the radiological images as listed and agreed with the findings in the report. No results found.  ASSESSMENT & PLAN:   38 y.o. female from [place] with  #1 Iron deficiency anemia Current iron deficiency anemia due to heavy menstrual losses.  #2 mild leukopenia WBC count 3.5k  #3 menorrhagia improving with Mirena IUD placed in March 2023 per patient.  PLAN:  -Discussed lab results from 08/05/2023 in detail with patient. CBC showed WBC of 3.1K, hemoglobin of 10.2, and platelets of 325K. -hgb mildly improved with IVIG from 8.6 five months ago to 10.2 currently -WBC and platelets normal -ferritin from his iron deficiency has improved from 3 to 13 but still low -iron saturation 4% -patient is agreeable to receiving additional  IV iron infusions in 2 weeks after her pneumonia resolves to correct her iron deficiency -will set up IV feraheme infusions at [facility] -answered all of patient's questions in detail -patient shall return to clinic in 6 months  -continue to follow with OB/GYN for menstrual blood loss management  FOLLOW-UP: IV feraheme 510mg  weekly x 2 doses starting in  2-3 weeks. Phone visit with Dr Candise Che in 6 months with labs the day before the phone visit  The total time spent in the appointment was 20 minutes* .  All of the patient's questions were answered with apparent satisfaction. The patient knows to call the clinic with any problems, questions or concerns.   Wyvonnia Lora MD MS AAHIVMS [facility] Hematology/Oncology Physician [facility]  .*Total Encounter Time as defined by the Centers for Medicare and Medicaid Services includes, in addition to the face-to-face time of a patient visit (documented in the note above) non-face-to-face time: obtaining and reviewing outside history, ordering and reviewing medications, tests or procedures, care coordination (communications with other health care professionals or caregivers) and documentation in the medical record.    I,Mitra Faeizi,acting as a Neurosurgeon for Wyvonnia Lora, MD.,have documented all relevant documentation on the behalf of Wyvonnia Lora, MD,as directed by  Wyvonnia Lora, MD while in the presence of Wyvonnia Lora, MD.  .I have reviewed the above documentation for accuracy and completeness, and I agree with the above. Johney Maine MD

## 2023-08-08 ENCOUNTER — Telehealth: Payer: Self-pay | Admitting: Hematology

## 2023-08-08 NOTE — Telephone Encounter (Signed)
Left patient a vm regarding upcoming appointment

## 2023-08-12 ENCOUNTER — Telehealth: Payer: Self-pay

## 2023-08-12 NOTE — Telephone Encounter (Signed)
 Auth Submission: DENIED Site of care: Site of care: [facility] Payer: [employer] development officer Medication & CPT/J Code(s) submitted: Feraheme (ferumoxytol) [phone] Route of submission (phone, fax, portal): portal Phone # Fax # Auth type: Buy/Bill PB  Authorization has been DENIED because the patients plan will only cover Feraheme if the patient has tried Infed. We do not provide infed here at [facility] infusion.

## 2023-08-13 ENCOUNTER — Encounter: Payer: Self-pay | Admitting: Hematology

## 2023-08-21 NOTE — Telephone Encounter (Signed)
 I called [employer] and they stated that  Venofer J1756 - No PA required Ferrelicit [phone] - No PA required  Would you like to one of these irons?  Ref # for the call: [id number]

## 2023-08-22 ENCOUNTER — Other Ambulatory Visit: Payer: Self-pay | Admitting: Hematology

## 2023-08-28 ENCOUNTER — Telehealth: Payer: Self-pay

## 2023-08-28 ENCOUNTER — Other Ambulatory Visit: Payer: Self-pay | Admitting: Hematology

## 2023-08-28 ENCOUNTER — Telehealth: Payer: Self-pay | Admitting: Hematology

## 2023-08-28 NOTE — Telephone Encounter (Signed)
 Dr. Candise Che and [facility], patient will be scheduled as soon as possible.  Auth Submission: NO AUTH NEEDED Site of care: Site of care: [facility] Payer: [employer] commercial Medication & CPT/J Code(s) submitted: Venofer (Iron Sucrose) J1756 Route of submission (phone, fax, portal):  Phone # Fax # Auth type: Buy/Bill PB Units/visits requested: 300mg  x 3 doses Reference number:  Approval from: 08/28/23 to 02/28/24

## 2023-08-28 NOTE — Addendum Note (Signed)
 Addended by: Ihor Austin D on: 08/28/2023 10:23 AM   Modules accepted: Orders

## 2023-08-30 ENCOUNTER — Other Ambulatory Visit: Payer: Self-pay | Admitting: Hematology

## 2023-08-30 ENCOUNTER — Inpatient Hospital Stay: Payer: Commercial Managed Care - PPO | Attending: Hematology

## 2023-08-30 ENCOUNTER — Other Ambulatory Visit: Payer: Self-pay | Admitting: Physician Assistant

## 2023-08-30 VITALS — BP 141/87 | HR 86 | Temp 98.2°F | Resp 17

## 2023-08-30 DIAGNOSIS — N920 Excessive and frequent menstruation with regular cycle: Secondary | ICD-10-CM | POA: Insufficient documentation

## 2023-08-30 DIAGNOSIS — D500 Iron deficiency anemia secondary to blood loss (chronic): Secondary | ICD-10-CM | POA: Diagnosis not present

## 2023-08-30 DIAGNOSIS — D509 Iron deficiency anemia, unspecified: Secondary | ICD-10-CM

## 2023-08-30 DIAGNOSIS — D508 Other iron deficiency anemias: Secondary | ICD-10-CM

## 2023-08-30 MED ORDER — SODIUM CHLORIDE 0.9 % IV SOLN: INTRAVENOUS | Status: DC

## 2023-08-30 MED ORDER — DIPHENHYDRAMINE HCL 25 MG PO CAPS
25.0000 mg | ORAL_CAPSULE | Freq: Once | ORAL | Status: AC
  Administered 2023-08-30: 25 mg via ORAL
  Filled 2023-08-30: qty 1

## 2023-08-30 MED ORDER — ACETAMINOPHEN 325 MG PO TABS
650.0000 mg | ORAL_TABLET | Freq: Once | ORAL | Status: AC
  Administered 2023-08-30: 650 mg via ORAL
  Filled 2023-08-30: qty 2

## 2023-08-30 MED ORDER — SODIUM CHLORIDE 0.9 % IV SOLN
300.0000 mg | Freq: Once | INTRAVENOUS | Status: AC
  Administered 2023-08-30: 300 mg via INTRAVENOUS
  Filled 2023-08-30: qty 300

## 2023-08-30 NOTE — Progress Notes (Signed)
 Pt declined 30 minute Pt tolerated Tx well w/out incident. VSS at discharge.  Ambulatory to lobby.

## 2023-08-30 NOTE — Patient Instructions (Signed)

## 2023-09-02 ENCOUNTER — Other Ambulatory Visit (HOSPITAL_COMMUNITY): Payer: Self-pay | Admitting: Urgent Care

## 2023-09-04 ENCOUNTER — Other Ambulatory Visit (HOSPITAL_COMMUNITY): Payer: Self-pay

## 2023-09-05 ENCOUNTER — Other Ambulatory Visit (HOSPITAL_COMMUNITY): Payer: Self-pay

## 2023-09-05 ENCOUNTER — Encounter: Payer: Self-pay | Admitting: Physician Assistant

## 2023-09-05 ENCOUNTER — Other Ambulatory Visit: Payer: Self-pay

## 2023-09-06 ENCOUNTER — Inpatient Hospital Stay: Payer: Commercial Managed Care - PPO

## 2023-09-06 VITALS — BP 140/86 | HR 83 | Temp 98.4°F | Resp 18

## 2023-09-06 DIAGNOSIS — N920 Excessive and frequent menstruation with regular cycle: Secondary | ICD-10-CM | POA: Diagnosis not present

## 2023-09-06 DIAGNOSIS — D500 Iron deficiency anemia secondary to blood loss (chronic): Secondary | ICD-10-CM | POA: Diagnosis not present

## 2023-09-06 DIAGNOSIS — D508 Other iron deficiency anemias: Secondary | ICD-10-CM

## 2023-09-06 DIAGNOSIS — D509 Iron deficiency anemia, unspecified: Secondary | ICD-10-CM

## 2023-09-06 MED ORDER — DIPHENHYDRAMINE HCL 25 MG PO CAPS
25.0000 mg | ORAL_CAPSULE | Freq: Once | ORAL | Status: AC
  Administered 2023-09-06: 25 mg via ORAL
  Filled 2023-09-06: qty 1

## 2023-09-06 MED ORDER — SODIUM CHLORIDE 0.9 % IV SOLN: INTRAVENOUS | Status: DC

## 2023-09-06 MED ORDER — SODIUM CHLORIDE 0.9 % IV SOLN
300.0000 mg | Freq: Once | INTRAVENOUS | Status: AC
  Administered 2023-09-06: 300 mg via INTRAVENOUS
  Filled 2023-09-06: qty 15

## 2023-09-06 MED ORDER — ACETAMINOPHEN 325 MG PO TABS
650.0000 mg | ORAL_TABLET | Freq: Once | ORAL | Status: AC
  Administered 2023-09-06: 650 mg via ORAL
  Filled 2023-09-06: qty 2

## 2023-09-06 NOTE — Patient Instructions (Signed)
 Iron Sucrose Injection What is this medication? IRON SUCROSE (EYE ern SOO krose) treats low levels of iron (iron deficiency anemia) in people with kidney disease. Iron is a mineral that plays an important role in making red blood cells, which carry oxygen from your lungs to the rest of your body. This medicine may be used for other purposes; ask your health care provider or pharmacist if you have questions. COMMON BRAND NAME(S): Venofer What should I tell my care team before I take this medication? They need to know if you have any of these conditions: Anemia not caused by low iron levels Heart disease High levels of iron in the blood Kidney disease Liver disease An unusual or allergic reaction to iron, other medications, foods, dyes, or preservatives Pregnant or trying to get pregnant Breastfeeding How should I use this medication? This medication is for infusion into a vein. It is given in a hospital or clinic setting. Talk to your care team about the use of this medication in children. While this medication may be prescribed for children as young as 2 years for selected conditions, precautions do apply. Overdosage: If you think you have taken too much of this medicine contact a poison control center or emergency room at once. NOTE: This medicine is only for you. Do not share this medicine with others. What if I miss a dose? Keep appointments for follow-up doses. It is important not to miss your dose. Call your care team if you are unable to keep an appointment. What may interact with this medication? Do not take this medication with any of the following: Deferoxamine Dimercaprol Other iron products This medication may also interact with the following: Chloramphenicol Deferasirox This list may not describe all possible interactions. Give your health care provider a list of all the medicines, herbs, non-prescription drugs, or dietary supplements you use. Also tell them if you smoke,  drink alcohol, or use illegal drugs. Some items may interact with your medicine. What should I watch for while using this medication? Visit your care team regularly. Tell your care team if your symptoms do not start to get better or if they get worse. You may need blood work done while you are taking this medication. You may need to follow a special diet. Talk to your care team. Foods that contain iron include: whole grains/cereals, dried fruits, beans, or peas, leafy green vegetables, and organ meats (liver, kidney). What side effects may I notice from receiving this medication? Side effects that you should report to your care team as soon as possible: Allergic reactions--skin rash, itching, hives, swelling of the face, lips, tongue, or throat Low blood pressure--dizziness, feeling faint or lightheaded, blurry vision Shortness of breath Side effects that usually do not require medical attention (report to your care team if they continue or are bothersome): Flushing Headache Joint pain Muscle pain Nausea Pain, redness, or irritation at injection site This list may not describe all possible side effects. Call your doctor for medical advice about side effects. You may report side effects to FDA at 1-800-FDA-1088. Where should I keep my medication? This medication is given in a hospital or clinic. It will not be stored at home. NOTE: This sheet is a summary. It may not cover all possible information. If you have questions about this medicine, talk to your doctor, pharmacist, or health care provider.  2024 Elsevier/Gold Standard (2022-11-16 00:00:00)

## 2023-09-06 NOTE — Progress Notes (Signed)
 Patient declined post iron observation.  Tolerated treatment well without incident.  Ambulated to lobby.

## 2023-10-22 ENCOUNTER — Other Ambulatory Visit (HOSPITAL_COMMUNITY): Payer: Self-pay

## 2023-10-23 ENCOUNTER — Other Ambulatory Visit: Payer: Self-pay

## 2023-10-23 ENCOUNTER — Other Ambulatory Visit (HOSPITAL_COMMUNITY): Payer: Self-pay

## 2023-10-23 ENCOUNTER — Encounter: Payer: Self-pay | Admitting: Physician Assistant

## 2023-10-24 ENCOUNTER — Other Ambulatory Visit (HOSPITAL_COMMUNITY): Payer: Self-pay

## 2023-10-25 ENCOUNTER — Other Ambulatory Visit (HOSPITAL_COMMUNITY): Payer: Self-pay

## 2023-10-28 ENCOUNTER — Other Ambulatory Visit (HOSPITAL_COMMUNITY): Payer: Self-pay

## 2023-10-31 ENCOUNTER — Other Ambulatory Visit (HOSPITAL_COMMUNITY): Payer: Self-pay

## 2023-11-01 ENCOUNTER — Other Ambulatory Visit (HOSPITAL_COMMUNITY): Payer: Self-pay

## 2023-11-05 ENCOUNTER — Other Ambulatory Visit (HOSPITAL_COMMUNITY): Payer: Self-pay

## 2023-11-08 ENCOUNTER — Other Ambulatory Visit (HOSPITAL_COMMUNITY): Payer: Self-pay

## 2023-11-08 DIAGNOSIS — E785 Hyperlipidemia, unspecified: Secondary | ICD-10-CM | POA: Diagnosis not present

## 2023-11-08 DIAGNOSIS — E559 Vitamin D deficiency, unspecified: Secondary | ICD-10-CM | POA: Diagnosis not present

## 2023-11-08 DIAGNOSIS — D649 Anemia, unspecified: Secondary | ICD-10-CM | POA: Diagnosis not present

## 2023-11-08 DIAGNOSIS — E669 Obesity, unspecified: Secondary | ICD-10-CM | POA: Diagnosis not present

## 2023-11-08 DIAGNOSIS — E119 Type 2 diabetes mellitus without complications: Secondary | ICD-10-CM | POA: Diagnosis not present

## 2023-11-08 MED ORDER — OZEMPIC (2 MG/DOSE) 8 MG/3ML ~~LOC~~ SOPN
2.0000 mg | PEN_INJECTOR | SUBCUTANEOUS | 3 refills | Status: DC
  Filled 2023-11-08 – 2023-11-29 (×3): qty 3, 28d supply, fill #0
  Filled 2024-01-01: qty 3, 28d supply, fill #1
  Filled 2024-02-03: qty 3, 28d supply, fill #2

## 2023-11-11 ENCOUNTER — Other Ambulatory Visit (HOSPITAL_COMMUNITY): Payer: Self-pay

## 2023-11-29 ENCOUNTER — Other Ambulatory Visit (HOSPITAL_COMMUNITY): Payer: Self-pay

## 2023-12-06 ENCOUNTER — Encounter: Payer: Self-pay | Admitting: Physician Assistant

## 2023-12-06 ENCOUNTER — Other Ambulatory Visit (HOSPITAL_COMMUNITY): Payer: Self-pay

## 2023-12-09 ENCOUNTER — Other Ambulatory Visit (HOSPITAL_COMMUNITY): Payer: Self-pay

## 2023-12-12 ENCOUNTER — Other Ambulatory Visit (HOSPITAL_COMMUNITY): Payer: Self-pay

## 2023-12-18 ENCOUNTER — Encounter: Payer: Self-pay | Admitting: Radiology

## 2023-12-18 ENCOUNTER — Other Ambulatory Visit (HOSPITAL_COMMUNITY)
Admission: RE | Admit: 2023-12-18 | Discharge: 2023-12-18 | Disposition: A | Discharge status: Home / Self Care | Source: Ambulatory Visit | Attending: Obstetrics and Gynecology | Admitting: Obstetrics and Gynecology

## 2023-12-18 ENCOUNTER — Ambulatory Visit (INDEPENDENT_AMBULATORY_CARE_PROVIDER_SITE_OTHER): Admitting: Radiology

## 2023-12-18 VITALS — BP 116/78 | HR 105 | Ht 66.5 in | Wt 215.6 lb

## 2023-12-18 DIAGNOSIS — Z01419 Encounter for gynecological examination (general) (routine) without abnormal findings: Secondary | ICD-10-CM

## 2023-12-18 DIAGNOSIS — Z1331 Encounter for screening for depression: Secondary | ICD-10-CM | POA: Diagnosis not present

## 2023-12-18 DIAGNOSIS — D219 Benign neoplasm of connective and other soft tissue, unspecified: Secondary | ICD-10-CM

## 2023-12-18 NOTE — Patient Instructions (Signed)

## 2023-12-18 NOTE — Progress Notes (Signed)
 Tonya Gilbert [birth year] [id number]   History:  38 y.o. G0 presents for annual exam. Hx of multiple fibroids, previously expelled IUD, interested in pregnancy.  Gynecologic History Patient's last menstrual period was 12/11/2023 (approximate). Period Cycle (Days):  (21-30) Period Duration (Days): 5 Period Pattern: (!) Irregular Menstrual Flow: Light, Heavy Menstrual Control: Thin pad, Maxi pad Dysmenorrhea: (!) Mild Dysmenorrhea Symptoms: Cramping Contraception/Family planning: none Sexually active: yes Last Pap: 2022 per pt.   Obstetric History OB History  Gravida Para Term Preterm AB Living  0 0 0 0 0 0  SAB IAB Ectopic Multiple Live Births  0 0 0 0 0       12/18/2023    2:42 PM  Depression screen PHQ 2/9  Decreased Interest 0  Down, Depressed, Hopeless 0  PHQ - 2 Score 0     The following portions of the patient's history were reviewed and updated as appropriate: allergies, current medications, past family history, past medical history, past social history, past surgical history, and problem list.  Review of Systems  All other systems reviewed and are negative.   Past medical history, past surgical history, family history and social history were all reviewed and documented in the EPIC chart.  Exam:  Vitals:   12/18/23 1440  BP: 116/78  Pulse: (!) 105  SpO2: 97%  Weight: 215 lb 9.6 oz (97.8 kg)  Height: 5' 6.5 (1.689 m)   Body mass index is 34.28 kg/m.  Physical Exam Vitals and nursing note reviewed. Exam conducted with a chaperone present.  Constitutional:      Appearance: Normal appearance. She is normal weight.  HENT:     Head: Normocephalic and atraumatic.  Neck:     Thyroid : No thyroid  mass, thyromegaly or thyroid  tenderness.   Cardiovascular:     Rate and Rhythm: Regular rhythm.     Heart sounds: Normal heart sounds.  Pulmonary:     Effort: Pulmonary effort is normal.     Breath sounds: Normal breath sounds.  Chest:  Breasts:     Breasts are symmetrical.     Right: Normal. No inverted nipple, mass, nipple discharge, skin change or tenderness.     Left: Normal. No inverted nipple, mass, nipple discharge, skin change or tenderness.  Abdominal:     General: Abdomen is flat. Bowel sounds are normal.     Palpations: Abdomen is soft.  Genitourinary:    General: Normal vulva.     Vagina: Normal. No vaginal discharge, bleeding or lesions.     Cervix: Normal. No discharge or lesion.     Uterus: Normal. Enlarged. Not tender.      Adnexa: Right adnexa normal and left adnexa normal.       Right: No mass, tenderness or fullness.         Left: No mass, tenderness or fullness.       Comments: 18 week size Lymphadenopathy:     Upper Body:     Right upper body: No axillary adenopathy.     Left upper body: No axillary adenopathy.   Skin:    General: Skin is warm and dry.   Neurological:     Mental Status: She is alert and oriented to person, place, and time.   Psychiatric:        Mood and Affect: Mood normal.        Thought Content: Thought content normal.        Judgment: Judgment normal.      Darice Hoit, CMA present  for exam  Assessment/Plan:   1. Well woman exam with routine gynecological exam (Primary) - Cytology - PAP( [facility])  2. Fibroids - US  PELVIS TRANSVAGINAL NON-OB (TV ONLY); Future meet with EB after to talk about removal of fibroids and fertility  3. Depression screen negative      Return in about 1 year (around 12/17/2024) for Annual.  GINETTE COZIER B WHNP-BC 3:02 PM 12/18/2023

## 2023-12-31 LAB — CYTOLOGY - PAP
Comment: NEGATIVE
Comment: NEGATIVE
Comment: NEGATIVE
Diagnosis: UNDETERMINED — AB
HPV 16: NEGATIVE
HPV 18 / 45: NEGATIVE
High risk HPV: POSITIVE — AB

## 2024-01-01 ENCOUNTER — Ambulatory Visit: Payer: Self-pay | Admitting: Radiology

## 2024-01-01 ENCOUNTER — Other Ambulatory Visit (HOSPITAL_COMMUNITY): Payer: Self-pay

## 2024-01-01 DIAGNOSIS — R87610 Atypical squamous cells of undetermined significance on cytologic smear of cervix (ASC-US): Secondary | ICD-10-CM

## 2024-01-16 ENCOUNTER — Other Ambulatory Visit (HOSPITAL_COMMUNITY): Payer: Self-pay

## 2024-01-16 ENCOUNTER — Encounter: Payer: Self-pay | Admitting: Obstetrics and Gynecology

## 2024-01-16 ENCOUNTER — Other Ambulatory Visit: Payer: Self-pay | Admitting: Radiology

## 2024-01-16 ENCOUNTER — Ambulatory Visit (INDEPENDENT_AMBULATORY_CARE_PROVIDER_SITE_OTHER)

## 2024-01-16 ENCOUNTER — Ambulatory Visit (INDEPENDENT_AMBULATORY_CARE_PROVIDER_SITE_OTHER): Admitting: Obstetrics and Gynecology

## 2024-01-16 VITALS — BP 126/84 | HR 80

## 2024-01-16 DIAGNOSIS — Z1331 Encounter for screening for depression: Secondary | ICD-10-CM

## 2024-01-16 DIAGNOSIS — D219 Benign neoplasm of connective and other soft tissue, unspecified: Secondary | ICD-10-CM | POA: Diagnosis not present

## 2024-01-16 DIAGNOSIS — N920 Excessive and frequent menstruation with regular cycle: Secondary | ICD-10-CM

## 2024-01-16 DIAGNOSIS — E2839 Other primary ovarian failure: Secondary | ICD-10-CM

## 2024-01-16 DIAGNOSIS — Z01419 Encounter for gynecological examination (general) (routine) without abnormal findings: Secondary | ICD-10-CM

## 2024-01-16 MED ORDER — NORETHINDRONE 0.35 MG PO TABS
1.0000 | ORAL_TABLET | Freq: Every day | ORAL | 6 refills | Status: DC
  Filled 2024-01-16: qty 84, 84d supply, fill #0
  Filled 2024-03-23: qty 84, 84d supply, fill #1

## 2024-01-16 NOTE — Progress Notes (Signed)
 Tonya Gilbert 1986-01-09 [id number]  Surgical Consult from [facility] And PUS results  Per her last visit: History:  38 y.o. G0 presents for annual exam. Hx of multiple fibroids, previously expelled IUD, interested in pregnancy. Would like to do Egg retrieval and IVF with donor sperm. Not currently sexually active and would like to move forward with a pregnancy.  Today, patient reports her periods last for 7 days each month.  The first day she is using a pad an hour and the second day she is changing a pad Q2-3 hours and the third changing Q3-4 hours and the last few days she is using 2ppd. She denies any dysmenorrhea and her blood count has been as low as hg 5.4. Last one in Feb was at 10.2    Gynecologic History Patient's last menstrual period was 01/04/2024 (exact date). Period Cycle (Days): 28 Period Duration (Days): 7 Menstrual Flow: Heavy, Moderate Menstrual Control: Maxi pad Dysmenorrhea: (!) Mild Dysmenorrhea Symptoms: Cramping Contraception/Family planning: none Sexually active: yes Last Pap: 2022 per pt.   Obstetric History OB History  Gravida Para Term Preterm AB Living  0 0 0 0 0 0  SAB IAB Ectopic Multiple Live Births  0 0 0 0 0       12/18/2023    2:42 PM  Depression screen PHQ 2/9  Decreased Interest 0  Down, Depressed, Hopeless 0  PHQ - 2 Score 0     Past Medical History:  Diagnosis Date   Anemia    Diabetes mellitus without complication (HCC)    History reviewed. No pertinent surgical history. Social History   Socioeconomic History   Marital status: Single    Spouse name: Not on file   Number of children: Not on file   Years of education: Not on file   Highest education level: Not on file  Occupational History   Not on file  Tobacco Use   Smoking status: Never    Passive exposure: Never   Smokeless tobacco: Never  Vaping Use   Vaping status: Never Used  Substance and Sexual Activity   Alcohol use: No   Drug use: No   Sexual  activity: Not Currently    Partners: Male    Birth control/protection: Abstinence    Comment: menarche 38yo, sexual debut 38yo  Other Topics Concern   Not on file  Social History Narrative   Not on file   Social Drivers of Health   Financial Resource Strain: Not on file  Food Insecurity: Not on file  Transportation Needs: Not on file  Physical Activity: Not on file  Stress: Not on file  Social Connections: Not on file   OB History     Gravida  0   Para  0   Term  0   Preterm  0   AB  0   Living  0      SAB  0   IAB  0   Ectopic  0   Multiple  0   Live Births  0          Current Outpatient Medications on File Prior to Visit  Medication Sig Dispense Refill   iron  polysaccharides (FERREX 150) 150 MG capsule Take 1 capsule (150 mg total) by mouth daily. 60 capsule 5   metFORMIN  (GLUCOPHAGE ) 500 MG tablet Take 1 tablet (500 mg total) by mouth 2 (two) times daily. 180 tablet 1   Multiple Vitamins-Minerals (MULTIVITAMIN) tablet Take 1 tablet by mouth daily.  Semaglutide , 2 MG/DOSE, (OZEMPIC , 2 MG/DOSE,) 8 MG/3ML SOPN Inject 2 mg into the skin once a week. 3 mL 3   azithromycin  (ZITHROMAX ) 250 MG tablet Take 2 tablets by mouth on day 1 then take 1 tablet daily for 4 days (Patient not taking: Reported on 01/16/2024) 6 tablet 0   cefdinir  (OMNICEF ) 300 MG capsule Take 1 capsule (300 mg total) by mouth 2 (two) times daily. 20 capsule 0   clindamycin  (CLEOCIN ) 150 MG capsule Take 1 capsule (150 mg total) by mouth 4 (four) times daily until gone (Patient not taking: Reported on 01/16/2024) 40 capsule 0   metFORMIN  (GLUCOPHAGE ) 500 MG tablet Take 1 tablet by mouth twice a day (Patient not taking: Reported on 01/16/2024) 60 tablet 0   metFORMIN  (GLUCOPHAGE ) 500 MG tablet Take 1 tablet by mouth 2 times a day for 30 day(s) (Patient not taking: Reported on 01/16/2024) 60 tablet 2   metFORMIN  (GLUCOPHAGE ) 500 MG tablet Take by mouth. (Patient not taking: Reported on 01/16/2024)      predniSONE  (DELTASONE ) 10 MG tablet Take 2 tablets (20 mg total) by mouth daily. (Patient not taking: Reported on 01/16/2024) 15 tablet 0   promethazine -dextromethorphan (PROMETHAZINE -DM) 6.25-15 MG/5ML syrup Take 5 mLs by mouth 3 (three) times daily as needed for cough. (Patient not taking: Reported on 01/16/2024) 200 mL 0   rosuvastatin  (CRESTOR ) 20 MG tablet Take 1 tablet (20 mg total) by mouth daily. (Patient not taking: Reported on 01/16/2024) 90 tablet 1   Semaglutide , 1 MG/DOSE, (OZEMPIC , 1 MG/DOSE,) 4 MG/3ML SOPN Inject 1 mg into the skin once a week. (Patient not taking: Reported on 12/18/2023) 12 mL 3   Semaglutide ,0.25 or 0.5MG /DOS, (OZEMPIC , 0.25 OR 0.5 MG/DOSE,) 2 MG/3ML SOPN Inject 0.5 mg into the skin once a week. (Patient not taking: Reported on 01/16/2024) 3 mL 0   [DISCONTINUED] norethindrone -ethinyl estradiol (BLISOVI FE 1/20) 1-20 MG-MCG tablet Take 1 tablet by mouth daily. 28 tablet 0   No current facility-administered medications on file prior to visit.   Allergies  Allergen Reactions   Bee Venom Anaphylaxis   Penicillins Itching and Rash     Review of Systems  All other systems reviewed and are negative.   Past medical history, past surgical history, family history and social history were all reviewed and documented in the EPIC chart.  Exam:  Vitals:   01/16/24 0823  BP: 126/84  Pulse: 80  SpO2: 98%   There is no height or weight on file to calculate BMI.  Last exam with Jami uterus enlarged 18cm PUS  13.94cm Multiple fibroids. Six measured fibroids: 4.97, 4.33, 3.31, 4.46, 1.36, 2.06cm Endometrial lining 5.54mm RO 25x43mm simple avascular cyst LO WNL No adnexal masses No free fluid  Assessment/Plan:   Symptomatic fibroids Anemia Desires future fertility  Counseled on the robotic myomectomy and the risks and benefits were discussed in detail.  Counseled on the risk of 50% of recurrent fibroids in 5 years, risk for bleeding, risk for an additional  surgery, or laparotomy.  Discussed using the MyoSure D&C and performing chromopertubation as well.  The chromopertubation is important as there can be crush injuries from the current fibroids, and help to flush any debris out of the fallopian tubes before conception.  We also discussed using the Olympus bag and morcellator, which is the only method used for and approved with the FDA for morcellation.  Discussed she will need to be on birth control and allow her uterus to heal after the myomectomy for  6 months afterwards before trying to conceive.  Discussed invasion of 50% of the Uterine muscle would require a C-section and delivery at 37 weeks.  She voiced understanding and wanted to proceed with the procedure.  A surgical request was sent. Referral has been placed with Dr. Yalcinkaya with [facility] as well. To begin micronor  daily to help reduce her current bleeding and increase her hg before surgery.  Discussed she will have abnormal bleeding after surgery, as the uterus heals.  30 minutes spent on reviewing records, imaging,  and one on one patient time and counseling patient and documentation Dr. Glennon   No follow-ups on file.  Almarie MARLA Glennon [facility] 9:12 AM 01/16/2024

## 2024-01-16 NOTE — Patient Instructions (Signed)
  [facility] [address]. Johnston [address] [place] [phone]  Dr. Glennon

## 2024-01-17 ENCOUNTER — Encounter: Payer: Self-pay | Admitting: Radiology

## 2024-01-17 ENCOUNTER — Other Ambulatory Visit (HOSPITAL_COMMUNITY)
Admission: RE | Admit: 2024-01-17 | Discharge: 2024-01-17 | Disposition: A | Discharge status: Home / Self Care | Source: Ambulatory Visit | Attending: Radiology | Admitting: Radiology

## 2024-01-17 ENCOUNTER — Other Ambulatory Visit: Payer: Self-pay | Admitting: *Deleted

## 2024-01-17 ENCOUNTER — Ambulatory Visit (INDEPENDENT_AMBULATORY_CARE_PROVIDER_SITE_OTHER): Admitting: Radiology

## 2024-01-17 ENCOUNTER — Telehealth: Payer: Self-pay | Admitting: *Deleted

## 2024-01-17 VITALS — BP 138/82 | Wt 214.6 lb

## 2024-01-17 DIAGNOSIS — R87610 Atypical squamous cells of undetermined significance on cytologic smear of cervix (ASC-US): Secondary | ICD-10-CM | POA: Diagnosis not present

## 2024-01-17 DIAGNOSIS — R87810 Cervical high risk human papillomavirus (HPV) DNA test positive: Secondary | ICD-10-CM | POA: Diagnosis not present

## 2024-01-17 DIAGNOSIS — Z01812 Encounter for preprocedural laboratory examination: Secondary | ICD-10-CM | POA: Diagnosis not present

## 2024-01-17 DIAGNOSIS — D508 Other iron deficiency anemias: Secondary | ICD-10-CM

## 2024-01-17 DIAGNOSIS — N920 Excessive and frequent menstruation with regular cycle: Secondary | ICD-10-CM | POA: Diagnosis not present

## 2024-01-17 DIAGNOSIS — N879 Dysplasia of cervix uteri, unspecified: Secondary | ICD-10-CM | POA: Diagnosis not present

## 2024-01-17 LAB — PREGNANCY, URINE: Preg Test, Ur: NEGATIVE

## 2024-01-17 NOTE — Telephone Encounter (Signed)
 Spoke with patient.   Patient is scheduled for colpo this morning with Jami, will have labs drawn while in office.   CBC and iron ,TIBC, ferritin panel added.   Patient is already established with hematology, scheduled for f/u with Dr. Onesimo on 02/07/24, labs 02/05/24. No additional referral placed.   Patient aware she will be contacted to discuss surgery dates once benefits have been reviewed. Patient verbalizes understanding and is agreeable.   Routing to provider for final review. Patient is agreeable to disposition. Will close encounter.

## 2024-01-17 NOTE — Telephone Encounter (Signed)
-----   Message from [name] sent at 01/17/2024  7:52 AM EDT ----- yes ----- Message ----- From: Brutus Kate SAILOR, RN Sent: 01/16/2024   1:15 PM EDT To: [name], MD  To confirm, she just needs a lab appt and referral to hematology?   [facility] ----- Message ----- From: Carpen Almarie MARLA, MD Sent: 01/16/2024   9:17 AM EDT To: [facility] Triage  I am sorry, Can we get her in for updated labs cbc and iron  studies and hematology consult for iv iron . Longstanding anemia with several fibroids and is going to have the myomectomy and I would like to have her HG as high as possible before to lower her risk of a blood transfusion Dr. Carpen

## 2024-01-17 NOTE — Progress Notes (Signed)
 38 y.o. [name] female here for colposcopy.   Patient's last menstrual period was 01/04/2024 (exact date).   Birth control: none, infertility  Last PAP:    Component Value Date/Time   DIAGPAP (A) 12/18/2023 1458    - Atypical squamous cells of undetermined significance (ASC-US )   HPVHIGH Positive (A) 12/18/2023 1458   ADEQPAP  12/18/2023 1458    Satisfactory for evaluation; transformation zone component PRESENT.     OB History  Gravida Para Term Preterm AB Living  0 0 0 0 0 0  SAB IAB Ectopic Multiple Live Births  0 0 0 0 0    Past Medical History:  Diagnosis Date   Anemia    Diabetes mellitus without complication (HCC)     No past surgical history on file.  Current Outpatient Medications on File Prior to Visit  Medication Sig Dispense Refill   cefdinir  (OMNICEF ) 300 MG capsule Take 1 capsule (300 mg total) by mouth 2 (two) times daily. 20 capsule 0   iron  polysaccharides (FERREX 150) 150 MG capsule Take 1 capsule (150 mg total) by mouth daily. 60 capsule 5   metFORMIN  (GLUCOPHAGE ) 500 MG tablet Take 1 tablet (500 mg total) by mouth 2 (two) times daily. 180 tablet 1   Multiple Vitamins-Minerals (MULTIVITAMIN) tablet Take 1 tablet by mouth daily.     azithromycin  (ZITHROMAX ) 250 MG tablet Take 2 tablets by mouth on day 1 then take 1 tablet daily for 4 days (Patient not taking: Reported on 01/16/2024) 6 tablet 0   clindamycin  (CLEOCIN ) 150 MG capsule Take 1 capsule (150 mg total) by mouth 4 (four) times daily until gone (Patient not taking: Reported on 01/17/2024) 40 capsule 0   metFORMIN  (GLUCOPHAGE ) 500 MG tablet Take 1 tablet by mouth twice a day (Patient not taking: Reported on 01/17/2024) 60 tablet 0   metFORMIN  (GLUCOPHAGE ) 500 MG tablet Take 1 tablet by mouth 2 times a day for 30 day(s) (Patient not taking: Reported on 01/17/2024) 60 tablet 2   metFORMIN  (GLUCOPHAGE ) 500 MG tablet Take by mouth. (Patient not taking: Reported on 01/16/2024)     norethindrone  (MICRONOR )  0.35 MG tablet Take 1 tablet (0.35 mg total) by mouth daily. 84 tablet 6   predniSONE  (DELTASONE ) 10 MG tablet Take 2 tablets (20 mg total) by mouth daily. (Patient not taking: Reported on 01/17/2024) 15 tablet 0   promethazine -dextromethorphan (PROMETHAZINE -DM) 6.25-15 MG/5ML syrup Take 5 mLs by mouth 3 (three) times daily as needed for cough. (Patient not taking: Reported on 01/16/2024) 200 mL 0   rosuvastatin  (CRESTOR ) 20 MG tablet Take 1 tablet (20 mg total) by mouth daily. (Patient not taking: Reported on 01/17/2024) 90 tablet 1   Semaglutide , 1 MG/DOSE, (OZEMPIC , 1 MG/DOSE,) 4 MG/3ML SOPN Inject 1 mg into the skin once a week. (Patient not taking: Reported on 01/17/2024) 12 mL 3   Semaglutide , 2 MG/DOSE, (OZEMPIC , 2 MG/DOSE,) 8 MG/3ML SOPN Inject 2 mg into the skin once a week. (Patient not taking: Reported on 01/17/2024) 3 mL 3   Semaglutide ,0.25 or 0.5MG /DOS, (OZEMPIC , 0.25 OR 0.5 MG/DOSE,) 2 MG/3ML SOPN Inject 0.5 mg into the skin once a week. (Patient not taking: Reported on 01/17/2024) 3 mL 0   [DISCONTINUED] norethindrone -ethinyl estradiol (BLISOVI FE 1/20) 1-20 MG-MCG tablet Take 1 tablet by mouth daily. 28 tablet 0   No current facility-administered medications on file prior to visit.    Allergies  Allergen Reactions   Bee Venom Anaphylaxis   Penicillins Itching and Rash  PE Today's Vitals   01/17/24 1104  BP: 138/82  Weight: 214 lb 9.6 oz (97.3 kg)   Body mass index is 34.12 kg/m.  Physical Exam Vitals and nursing note reviewed.  Constitutional:      Appearance: Normal appearance.  Genitourinary:       Comments: Aceto white changes  11 and 2 oclock Neurological:     Mental Status: She is alert.  Psychiatric:        Mood and Affect: Mood normal.        Thought Content: Thought content normal.        Judgment: Judgment normal.      Colposcopy Procedure Consented for procedure.  Time out performed. Speculum placed in vagina.  Acetic acid 3% was applied to  cervix.  Satisfactory colposcopy. TZ was completely seen. 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 Specimens to pathology separately.  Monsel's applied to biopsy areas.   Good hemostasis.  Minimal EBL. No complications.  Tolerated well.     Assessment and Plan:       1. 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 Encounter for preprocedural laboratory examination - Pregnancy, urine; negative - Surgical pathology( [place]/ [facility])   Abnormal PAP results reviewed. ASCCP guidelines reviewed. Consents signed. Satisfactory colposcopy, ECC and biopsy performed. Aftercare instructions provided.  Will contact with results. I also recommend completion of Gardasil vaccine up to age 45yo, avoidance of smoking, and use of condoms with new sexual partners to prevent progression of disease.   Teruko Joswick B, NP

## 2024-01-18 LAB — CBC
HCT: 32.7 % — ABNORMAL LOW (ref 35.0–45.0)
Hemoglobin: 9 g/dL — ABNORMAL LOW (ref 11.7–15.5)
MCH: 21 pg — ABNORMAL LOW (ref 27.0–33.0)
MCHC: 27.5 g/dL — ABNORMAL LOW (ref 32.0–36.0)
MCV: 76.4 fL — ABNORMAL LOW (ref 80.0–100.0)
MPV: 10.9 fL (ref 7.5–12.5)
Platelets: 416 Thousand/uL — ABNORMAL HIGH (ref 140–400)
RBC: 4.28 Million/uL (ref 3.80–5.10)
RDW: 15.8 % — ABNORMAL HIGH (ref 11.0–15.0)
WBC: 3.9 Thousand/uL (ref 3.8–10.8)

## 2024-01-18 LAB — IRON,TIBC AND FERRITIN PANEL
%SAT: 4 % — ABNORMAL LOW (ref 16–45)
Ferritin: 2 ng/mL — ABNORMAL LOW (ref 16–154)
Iron: 18 ug/dL — ABNORMAL LOW (ref 40–190)
TIBC: 407 ug/dL (ref 250–450)

## 2024-01-20 ENCOUNTER — Ambulatory Visit: Payer: Self-pay | Admitting: Obstetrics and Gynecology

## 2024-01-21 ENCOUNTER — Ambulatory Visit: Payer: Self-pay | Admitting: Radiology

## 2024-01-21 LAB — SURGICAL PATHOLOGY

## 2024-01-27 ENCOUNTER — Other Ambulatory Visit (HOSPITAL_COMMUNITY): Payer: Self-pay

## 2024-02-03 ENCOUNTER — Other Ambulatory Visit (HOSPITAL_COMMUNITY): Payer: Self-pay

## 2024-02-03 ENCOUNTER — Other Ambulatory Visit: Payer: Self-pay

## 2024-02-03 ENCOUNTER — Encounter: Payer: Self-pay | Admitting: Physician Assistant

## 2024-02-04 ENCOUNTER — Other Ambulatory Visit: Payer: Self-pay

## 2024-02-04 DIAGNOSIS — D508 Other iron deficiency anemias: Secondary | ICD-10-CM

## 2024-02-04 DIAGNOSIS — D509 Iron deficiency anemia, unspecified: Secondary | ICD-10-CM

## 2024-02-05 ENCOUNTER — Inpatient Hospital Stay: Payer: Commercial Managed Care - PPO | Attending: Hematology

## 2024-02-05 DIAGNOSIS — D72819 Decreased white blood cell count, unspecified: Secondary | ICD-10-CM | POA: Diagnosis not present

## 2024-02-05 DIAGNOSIS — D508 Other iron deficiency anemias: Secondary | ICD-10-CM

## 2024-02-05 DIAGNOSIS — D500 Iron deficiency anemia secondary to blood loss (chronic): Secondary | ICD-10-CM | POA: Insufficient documentation

## 2024-02-05 DIAGNOSIS — N920 Excessive and frequent menstruation with regular cycle: Secondary | ICD-10-CM | POA: Diagnosis not present

## 2024-02-05 DIAGNOSIS — E875 Hyperkalemia: Secondary | ICD-10-CM | POA: Insufficient documentation

## 2024-02-05 DIAGNOSIS — D509 Iron deficiency anemia, unspecified: Secondary | ICD-10-CM

## 2024-02-05 LAB — CMP (CANCER CENTER ONLY)
ALT: 11 U/L (ref 0–44)
AST: 13 U/L — ABNORMAL LOW (ref 15–41)
Albumin: 4 g/dL (ref 3.5–5.0)
Alkaline Phosphatase: 43 U/L (ref 38–126)
Anion gap: 7 (ref 5–15)
BUN: 9 mg/dL (ref 6–20)
CO2: 28 mmol/L (ref 22–32)
Calcium: 8.8 mg/dL — ABNORMAL LOW (ref 8.9–10.3)
Chloride: 103 mmol/L (ref 98–111)
Creatinine: 0.9 mg/dL (ref 0.44–1.00)
GFR, Estimated: >60 mL/min (ref >60)
Glucose, Bld: 206 mg/dL — ABNORMAL HIGH (ref 70–99)
Potassium: 3.1 mmol/L — ABNORMAL LOW (ref 3.5–5.1)
Sodium: 138 mmol/L (ref 135–145)
Total Bilirubin: 0.2 mg/dL (ref 0.0–1.2)
Total Protein: 6.7 g/dL (ref 6.5–8.1)

## 2024-02-05 LAB — CBC WITH DIFFERENTIAL (CANCER CENTER ONLY)
Abs Immature Granulocytes: 0.02 K/uL (ref 0.00–0.07)
Basophils Absolute: 0 K/uL (ref 0.0–0.1)
Basophils Relative: 1 %
Eosinophils Absolute: 0.1 K/uL (ref 0.0–0.5)
Eosinophils Relative: 2 %
HCT: 27.1 % — ABNORMAL LOW (ref 36.0–46.0)
Hemoglobin: 7.9 g/dL — ABNORMAL LOW (ref 12.0–15.0)
Immature Granulocytes: 1 %
Lymphocytes Relative: 25 %
Lymphs Abs: 1 K/uL (ref 0.7–4.0)
MCH: 20.6 pg — ABNORMAL LOW (ref 26.0–34.0)
MCHC: 29.2 g/dL — ABNORMAL LOW (ref 30.0–36.0)
MCV: 70.6 fL — ABNORMAL LOW (ref 80.0–100.0)
Monocytes Absolute: 0.3 K/uL (ref 0.1–1.0)
Monocytes Relative: 7 %
Neutro Abs: 2.6 K/uL (ref 1.7–7.7)
Neutrophils Relative %: 64 %
Platelet Count: 323 K/uL (ref 150–400)
RBC: 3.84 MIL/uL — ABNORMAL LOW (ref 3.87–5.11)
RDW: 18.4 % — ABNORMAL HIGH (ref 11.5–15.5)
WBC Count: 3.9 K/uL — ABNORMAL LOW (ref 4.0–10.5)
nRBC: 0 % (ref 0.0–0.2)

## 2024-02-05 LAB — IRON AND IRON BINDING CAPACITY (CC-WL,HP ONLY)
Iron: 22 ug/dL — ABNORMAL LOW (ref 28–170)
Saturation Ratios: 5 % — ABNORMAL LOW (ref 10.4–31.8)
TIBC: 469 ug/dL — ABNORMAL HIGH (ref 250–450)
UIBC: 447 ug/dL — ABNORMAL HIGH (ref 148–442)

## 2024-02-05 LAB — FERRITIN: Ferritin: 6 ng/mL — ABNORMAL LOW (ref 11–307)

## 2024-02-07 ENCOUNTER — Other Ambulatory Visit (HOSPITAL_COMMUNITY): Payer: Self-pay

## 2024-02-07 ENCOUNTER — Inpatient Hospital Stay (HOSPITAL_BASED_OUTPATIENT_CLINIC_OR_DEPARTMENT_OTHER): Payer: Commercial Managed Care - PPO | Admitting: Hematology

## 2024-02-07 DIAGNOSIS — D508 Other iron deficiency anemias: Secondary | ICD-10-CM | POA: Diagnosis not present

## 2024-02-07 DIAGNOSIS — D649 Anemia, unspecified: Secondary | ICD-10-CM | POA: Diagnosis not present

## 2024-02-07 DIAGNOSIS — D500 Iron deficiency anemia secondary to blood loss (chronic): Secondary | ICD-10-CM | POA: Diagnosis not present

## 2024-02-07 DIAGNOSIS — D72819 Decreased white blood cell count, unspecified: Secondary | ICD-10-CM | POA: Diagnosis not present

## 2024-02-07 DIAGNOSIS — E119 Type 2 diabetes mellitus without complications: Secondary | ICD-10-CM | POA: Diagnosis not present

## 2024-02-07 DIAGNOSIS — E669 Obesity, unspecified: Secondary | ICD-10-CM | POA: Diagnosis not present

## 2024-02-07 DIAGNOSIS — E785 Hyperlipidemia, unspecified: Secondary | ICD-10-CM | POA: Diagnosis not present

## 2024-02-07 DIAGNOSIS — N920 Excessive and frequent menstruation with regular cycle: Secondary | ICD-10-CM | POA: Diagnosis not present

## 2024-02-07 DIAGNOSIS — E875 Hyperkalemia: Secondary | ICD-10-CM | POA: Diagnosis not present

## 2024-02-07 DIAGNOSIS — E559 Vitamin D deficiency, unspecified: Secondary | ICD-10-CM | POA: Diagnosis not present

## 2024-02-07 MED ORDER — OZEMPIC (2 MG/DOSE) 8 MG/3ML ~~LOC~~ SOPN
2.0000 mg | PEN_INJECTOR | SUBCUTANEOUS | 3 refills | Status: AC
  Filled 2024-02-07 – 2024-03-03 (×2): qty 9, 84d supply, fill #0
  Filled 2024-06-24: qty 9, 84d supply, fill #1

## 2024-02-13 ENCOUNTER — Encounter: Payer: Self-pay | Admitting: Physician Assistant

## 2024-02-13 MED ORDER — POTASSIUM CHLORIDE CRYS ER 20 MEQ PO TBCR
20.0000 meq | EXTENDED_RELEASE_TABLET | Freq: Two times a day (BID) | ORAL | 0 refills | Status: DC
  Filled 2024-02-13: qty 20, 10d supply, fill #0

## 2024-02-13 NOTE — Progress Notes (Signed)
 HEMATOLOGY/ONCOLOGY PHONE VISIT NOTE  Date of Service: .02/07/2024  PCP: Zachary Berg Bonsu MD  CHIEF COMPLAINTS/PURPOSE OF CONSULTATION:   Follow-up for continued evaluation and management of iron  deficiency anemia.  HISTORY OF PRESENTING ILLNESS:   Please see previous note for details on initial presentation Interval History:   Tonya Gilbert is a 38 y.o. female here for continued evaluation and management of iron  deficiency anemia.  I connected with Sherlyn Foil on.02/07/2024 at  3:30 PM EDT by telephone visit and verified that I am speaking with the correct person using two identifiers.   I discussed the limitations, risks, security and privacy concerns of performing an evaluation and management service by telemedicine and the availability of in-person appointments. I also discussed with the patient that there may be a patient responsible charge related to this service. The patient expressed understanding and agreed to proceed.   Other persons participating in the visit and their role in the encounter: none   Patient's location: home  Provider's location: [facility]   Chief Complaint: continued evaluation and management of iron  deficiency anemia    Patient notes that she continues to have heavy periods and started on norethindrone  by her OB/GYN doctor..  She notes that the periods still last 5 to 7 days although which at least 3 to 4 days are quite heavy. She notes some mild fatigue but no lightheadedness dizziness or chest pain. No other evidence of blood loss.  MEDICAL HISTORY:   #1 obesity #2 iron  deficiency anemia #3 hypercholesterolemia not on medications  #4 obesity - on phentermine for weight loss #5 history of vitamin D deficiency #6 diabetes  SURGICAL HISTORY: No previous surgeries  SOCIAL HISTORY: Social History   Socioeconomic History   Marital status: Single    Spouse name: Not on file   Number of children: Not on file   Years of education:  Not on file   Highest education level: Not on file  Occupational History   Not on file  Tobacco Use   Smoking status: Never    Passive exposure: Never   Smokeless tobacco: Never  Vaping Use   Vaping status: Never Used  Substance and Sexual Activity   Alcohol use: No   Drug use: No   Sexual activity: Not Currently    Partners: Male    Birth control/protection: Abstinence    Comment: menarche 38yo, sexual debut 38yo  Other Topics Concern   Not on file  Social History Narrative   Not on file   Social Drivers of Health   Financial Resource Strain: Not on file  Food Insecurity: Not on file  Transportation Needs: Not on file  Physical Activity: Not on file  Stress: Not on file  Social Connections: Not on file  Intimate Partner Violence: Not on file    FAMILY HISTORY: No family history of blood disorders or cancer that she is aware of.  ALLERGIES:  is allergic to bee venom and penicillins.  MEDICATIONS:  Current Outpatient Medications  Medication Sig Dispense Refill   azithromycin  (ZITHROMAX ) 250 MG tablet Take 2 tablets by mouth on day 1 then take 1 tablet daily for 4 days (Patient not taking: Reported on 01/16/2024) 6 tablet 0   cefdinir  (OMNICEF ) 300 MG capsule Take 1 capsule (300 mg total) by mouth 2 (two) times daily. 20 capsule 0   clindamycin  (CLEOCIN ) 150 MG capsule Take 1 capsule (150 mg total) by mouth 4 (four) times daily until gone (Patient not taking: Reported on 01/17/2024)  40 capsule 0   iron  polysaccharides (FERREX 150) 150 MG capsule Take 1 capsule (150 mg total) by mouth daily. 60 capsule 5   metFORMIN  (GLUCOPHAGE ) 500 MG tablet Take 1 tablet by mouth twice a day (Patient not taking: Reported on 01/17/2024) 60 tablet 0   metFORMIN  (GLUCOPHAGE ) 500 MG tablet Take 1 tablet by mouth 2 times a day for 30 day(s) (Patient not taking: Reported on 01/17/2024) 60 tablet 2   metFORMIN  (GLUCOPHAGE ) 500 MG tablet Take 1 tablet (500 mg total) by mouth 2 (two) times daily. 180  tablet 1   metFORMIN  (GLUCOPHAGE ) 500 MG tablet Take by mouth. (Patient not taking: Reported on 01/16/2024)     Multiple Vitamins-Minerals (MULTIVITAMIN) tablet Take 1 tablet by mouth daily.     norethindrone  (MICRONOR ) 0.35 MG tablet Take 1 tablet (0.35 mg total) by mouth daily. 84 tablet 6   predniSONE  (DELTASONE ) 10 MG tablet Take 2 tablets (20 mg total) by mouth daily. (Patient not taking: Reported on 01/17/2024) 15 tablet 0   promethazine -dextromethorphan (PROMETHAZINE -DM) 6.25-15 MG/5ML syrup Take 5 mLs by mouth 3 (three) times daily as needed for cough. (Patient not taking: Reported on 01/16/2024) 200 mL 0   rosuvastatin  (CRESTOR ) 20 MG tablet Take 1 tablet (20 mg total) by mouth daily. (Patient not taking: Reported on 01/17/2024) 90 tablet 1   Semaglutide , 1 MG/DOSE, (OZEMPIC , 1 MG/DOSE,) 4 MG/3ML SOPN Inject 1 mg into the skin once a week. (Patient not taking: Reported on 01/17/2024) 12 mL 3   Semaglutide , 2 MG/DOSE, (OZEMPIC , 2 MG/DOSE,) 8 MG/3ML SOPN Inject 2 mg into the skin once a week. (Patient not taking: Reported on 01/17/2024) 3 mL 3   Semaglutide , 2 MG/DOSE, (OZEMPIC , 2 MG/DOSE,) 8 MG/3ML SOPN Inject 2 mg into the skin once a week. 12 mL 3   Semaglutide ,0.25 or 0.5MG /DOS, (OZEMPIC , 0.25 OR 0.5 MG/DOSE,) 2 MG/3ML SOPN Inject 0.5 mg into the skin once a week. (Patient not taking: Reported on 01/17/2024) 3 mL 0   No current facility-administered medications for this visit.    REVIEW OF SYSTEMS:    No black stools or blood in the stools.  No nosebleeds or gum bleeds.  No other evidence of bleeding.  PHYSICAL EXAMINATION: Telemedicine visit  LABORATORY DATA:  I have reviewed the data as listed  .    Latest Ref Rng & Units 02/05/2024    9:11 AM 01/17/2024   11:29 AM 08/05/2023    1:40 PM  CBC  WBC 4.0 - 10.5 K/uL 3.9  3.9  3.1   Hemoglobin 12.0 - 15.0 g/dL 7.9  9.0  89.7   Hematocrit 36.0 - 46.0 % 27.1  32.7  33.4   Platelets 150 - 400 K/uL 323  416  325    . CBC    Component  Value Date/Time   WBC 3.9 (L) 02/05/2024 0911   WBC 3.9 01/17/2024 1129   RBC 3.84 (L) 02/05/2024 0911   HGB 7.9 (L) 02/05/2024 0911   HGB 8.6 (L) 05/12/2020 1005   HGB 12.5 10/25/2015 1608   HCT 27.1 (L) 02/05/2024 0911   HCT 27.3 (L) 05/12/2020 1005   HCT 38.2 10/25/2015 1608   PLT 323 02/05/2024 0911   PLT 262 05/12/2020 1005   MCV 70.6 (L) 02/05/2024 0911   MCV 82 05/12/2020 1005   MCV 81.3 10/25/2015 1608   MCH 20.6 (L) 02/05/2024 0911   MCHC 29.2 (L) 02/05/2024 0911   RDW 18.4 (H) 02/05/2024 0911   RDW 15.5 (H)  05/12/2020 1005   RDW 13.9 10/25/2015 1608   LYMPHSABS 1.0 02/05/2024 0911   LYMPHSABS 1.8 10/25/2015 1608   MONOABS 0.3 02/05/2024 0911   MONOABS 0.5 10/25/2015 1608   EOSABS 0.1 02/05/2024 0911   EOSABS 0.1 10/25/2015 1608   BASOSABS 0.0 02/05/2024 0911   BASOSABS 0.0 10/25/2015 1608     .    Latest Ref Rng & Units 02/05/2024    9:11 AM 08/05/2023    1:40 PM 02/11/2023    1:09 PM  CMP  Glucose 70 - 99 mg/dL 793  882  858   BUN 6 - 20 mg/dL 9  9  9    Creatinine 0.44 - 1.00 mg/dL 9.09  9.01  9.25   Sodium 135 - 145 mmol/L 138  138  140   Potassium 3.5 - 5.1 mmol/L 3.1  3.2  4.1   Chloride 98 - 111 mmol/L 103  104  105   CO2 22 - 32 mmol/L 28  29  29    Calcium  8.9 - 10.3 mg/dL 8.8  9.6  9.2   Total Protein 6.5 - 8.1 g/dL 6.7  7.4  7.3   Total Bilirubin 0.0 - 1.2 mg/dL 0.2  0.2  0.2   Alkaline Phos 38 - 126 U/L 43  43  37   AST 15 - 41 U/L 13  16  11    ALT 0 - 44 U/L 11  14  10     . Lab Results  Component Value Date   IRON  22 (L) 02/05/2024   TIBC 469 (H) 02/05/2024   IRONPCTSAT 5 (L) 02/05/2024   (Iron  and TIBC)  Lab Results  Component Value Date   FERRITIN 6 (L) 02/05/2024   RADIOGRAPHIC STUDIES: I have personally reviewed the radiological images as listed and agreed with the findings in the report. US  PELVIC COMPLETE WITH TRANSVAGINAL Result Date: 01/16/2024 13.94cm Multiple fibroids. Six measured fibroids: 4.97, 4.33, 3.31, 4.46, 1.36,  2.06cm Endometrial lining 5.20mm RO 25x66mm simple avascular cyst LO WNL No adnexal masses No free fluid    ASSESSMENT & PLAN:   38 y.o. female from [place] with  #1 Iron  deficiency anemia Current iron  deficiency anemia due to heavy menstrual losses.  #2 mild leukopenia WBC count 3.5k  #3 menorrhagia improving with Mirena  IUD placed in March 2023 per patient.  PLAN:  - Discussed labs with the patient down from 02/05/2024 CBC showed drop in hemoglobin to 7.9 with RBC microcytosis MCV of 70.6 with normal platelet count of 323k and normal WBC count. - CMP shows hyperkalemia with a potassium of 3.1 -ferritin 6 -Iron  saturation 5% - Patient has continued severe iron  deficiency and anemia due to continued heavy menstrual losses which are not currently controlled. - She notes that she was recently started on progesterone pills.  Norethindrone . - She was recommended to have close follow-up with her OB/GYN doctor to ensure her periods are controlled for her not to develop complications from severe blood loss anemia. - We discussed and she is agreeable to additional IV iron  replacement we will order Venofer  300 mg weekly x 4 doses.  Patient prefers to get her IV iron  here at [facility] since she works here as a Psychologist, sport and exercise. -  FOLLOW-UP: Venofer  300 mg weekly x 4 doses at at [facility] infusion room per patient's preference Phone visit with Dr. Onesimo in 2 months Repeat labs with PCP in 3 weeks to monitor hemoglobin and potassium   The total time spent  in the appointment was 20 minutes*.  All of the patient's questions were answered with apparent satisfaction. The patient knows to call the clinic with any problems, questions or concerns.   Emaline Saran MD MS AAHIVMS [facility] Hematology/Oncology Physician [facility]  .*Total Encounter Time as defined by the Centers for Medicare and Medicaid Services includes, in addition to the face-to-face time of a  patient visit (documented in the note above) non-face-to-face time: obtaining and reviewing outside history, ordering and reviewing medications, tests or procedures, care coordination (communications with other health care professionals or caregivers) and documentation in the medical record.

## 2024-02-14 ENCOUNTER — Other Ambulatory Visit (HOSPITAL_COMMUNITY): Payer: Self-pay

## 2024-02-19 ENCOUNTER — Other Ambulatory Visit (HOSPITAL_COMMUNITY): Payer: Self-pay

## 2024-02-19 ENCOUNTER — Telehealth: Payer: Self-pay | Admitting: Hematology

## 2024-02-19 NOTE — Telephone Encounter (Signed)
 Scheduled appointment per 8/26 secure chat. Talked with the patient and she is aware of the made appointment.

## 2024-02-28 ENCOUNTER — Other Ambulatory Visit (HOSPITAL_COMMUNITY): Payer: Self-pay

## 2024-02-29 ENCOUNTER — Inpatient Hospital Stay: Attending: Hematology

## 2024-02-29 VITALS — BP 124/71 | HR 90 | Temp 98.1°F | Resp 17

## 2024-02-29 DIAGNOSIS — N920 Excessive and frequent menstruation with regular cycle: Secondary | ICD-10-CM | POA: Insufficient documentation

## 2024-02-29 DIAGNOSIS — D500 Iron deficiency anemia secondary to blood loss (chronic): Secondary | ICD-10-CM | POA: Insufficient documentation

## 2024-02-29 DIAGNOSIS — D508 Other iron deficiency anemias: Secondary | ICD-10-CM

## 2024-02-29 DIAGNOSIS — D509 Iron deficiency anemia, unspecified: Secondary | ICD-10-CM

## 2024-02-29 MED ORDER — ACETAMINOPHEN 325 MG PO TABS
650.0000 mg | ORAL_TABLET | Freq: Once | ORAL | Status: AC
  Administered 2024-02-29: 650 mg via ORAL
  Filled 2024-02-29: qty 2

## 2024-02-29 MED ORDER — DIPHENHYDRAMINE HCL 25 MG PO CAPS
25.0000 mg | ORAL_CAPSULE | Freq: Once | ORAL | Status: AC
  Administered 2024-02-29: 25 mg via ORAL
  Filled 2024-02-29: qty 1

## 2024-02-29 MED ORDER — IRON SUCROSE 300 MG IVPB - SIMPLE MED
300.0000 mg | Freq: Once | Status: AC
  Administered 2024-02-29: 300 mg via INTRAVENOUS
  Filled 2024-02-29: qty 200

## 2024-02-29 MED ORDER — SODIUM CHLORIDE 0.9 % IV SOLN: INTRAVENOUS | Status: DC

## 2024-02-29 NOTE — Patient Instructions (Signed)

## 2024-03-03 ENCOUNTER — Other Ambulatory Visit: Payer: Self-pay | Admitting: Hematology

## 2024-03-03 ENCOUNTER — Other Ambulatory Visit: Payer: Self-pay

## 2024-03-03 ENCOUNTER — Other Ambulatory Visit (HOSPITAL_COMMUNITY): Payer: Self-pay

## 2024-03-03 MED ORDER — POLYSACCHARIDE IRON COMPLEX 150 MG PO CAPS
150.0000 mg | ORAL_CAPSULE | Freq: Every day | ORAL | 5 refills | Status: AC
  Filled 2024-03-03 – 2024-03-09 (×2): qty 60, 60d supply, fill #0
  Filled 2024-05-19: qty 60, 60d supply, fill #1
  Filled 2024-06-24: qty 60, 60d supply, fill #2

## 2024-03-03 MED ORDER — POTASSIUM CHLORIDE CRYS ER 20 MEQ PO TBCR
20.0000 meq | EXTENDED_RELEASE_TABLET | Freq: Two times a day (BID) | ORAL | 0 refills | Status: DC
  Filled 2024-03-03: qty 20, 10d supply, fill #0

## 2024-03-04 ENCOUNTER — Other Ambulatory Visit (HOSPITAL_COMMUNITY): Payer: Self-pay

## 2024-03-05 ENCOUNTER — Other Ambulatory Visit (HOSPITAL_COMMUNITY): Payer: Self-pay

## 2024-03-06 ENCOUNTER — Other Ambulatory Visit (HOSPITAL_COMMUNITY): Payer: Self-pay

## 2024-03-06 ENCOUNTER — Other Ambulatory Visit: Payer: Self-pay | Admitting: Physician Assistant

## 2024-03-06 ENCOUNTER — Telehealth: Admitting: Hematology

## 2024-03-06 MED FILL — Iron Sucrose Inj 20 MG/ML (Fe Equiv): Qty: 265 | Status: AC

## 2024-03-07 ENCOUNTER — Inpatient Hospital Stay

## 2024-03-07 VITALS — BP 134/89 | HR 96 | Temp 87.7°F | Resp 18

## 2024-03-07 DIAGNOSIS — D509 Iron deficiency anemia, unspecified: Secondary | ICD-10-CM

## 2024-03-07 DIAGNOSIS — D508 Other iron deficiency anemias: Secondary | ICD-10-CM

## 2024-03-07 DIAGNOSIS — D500 Iron deficiency anemia secondary to blood loss (chronic): Secondary | ICD-10-CM | POA: Diagnosis not present

## 2024-03-07 DIAGNOSIS — N920 Excessive and frequent menstruation with regular cycle: Secondary | ICD-10-CM | POA: Diagnosis not present

## 2024-03-07 MED ORDER — SODIUM CHLORIDE 0.9 % IV SOLN: INTRAVENOUS | Status: DC

## 2024-03-07 MED ORDER — IRON SUCROSE 300 MG IVPB - SIMPLE MED
300.0000 mg | Freq: Once | Status: AC
  Administered 2024-03-07: 300 mg via INTRAVENOUS
  Filled 2024-03-07: qty 265

## 2024-03-07 MED ORDER — DIPHENHYDRAMINE HCL 25 MG PO CAPS
25.0000 mg | ORAL_CAPSULE | Freq: Once | ORAL | Status: AC
  Administered 2024-03-07: 25 mg via ORAL
  Filled 2024-03-07: qty 1

## 2024-03-07 MED ORDER — ACETAMINOPHEN 325 MG PO TABS
650.0000 mg | ORAL_TABLET | Freq: Once | ORAL | Status: AC
  Administered 2024-03-07: 650 mg via ORAL
  Filled 2024-03-07: qty 2

## 2024-03-09 ENCOUNTER — Encounter (HOSPITAL_COMMUNITY): Payer: Self-pay

## 2024-03-09 ENCOUNTER — Encounter: Payer: Self-pay | Admitting: Physician Assistant

## 2024-03-09 ENCOUNTER — Other Ambulatory Visit (HOSPITAL_COMMUNITY): Payer: Self-pay

## 2024-03-12 ENCOUNTER — Other Ambulatory Visit (HOSPITAL_COMMUNITY): Payer: Self-pay

## 2024-03-13 DIAGNOSIS — Z0289 Encounter for other administrative examinations: Secondary | ICD-10-CM

## 2024-03-13 MED FILL — Iron Sucrose Inj 20 MG/ML (Fe Equiv): Qty: 265 | Status: AC

## 2024-03-14 ENCOUNTER — Inpatient Hospital Stay

## 2024-03-14 VITALS — BP 145/80 | HR 90 | Temp 98.1°F | Resp 17

## 2024-03-14 DIAGNOSIS — D500 Iron deficiency anemia secondary to blood loss (chronic): Secondary | ICD-10-CM | POA: Diagnosis not present

## 2024-03-14 DIAGNOSIS — N920 Excessive and frequent menstruation with regular cycle: Secondary | ICD-10-CM | POA: Diagnosis not present

## 2024-03-14 DIAGNOSIS — D509 Iron deficiency anemia, unspecified: Secondary | ICD-10-CM

## 2024-03-14 DIAGNOSIS — D508 Other iron deficiency anemias: Secondary | ICD-10-CM

## 2024-03-14 MED ORDER — DIPHENHYDRAMINE HCL 25 MG PO CAPS
25.0000 mg | ORAL_CAPSULE | Freq: Once | ORAL | Status: AC
  Administered 2024-03-14: 25 mg via ORAL
  Filled 2024-03-14: qty 1

## 2024-03-14 MED ORDER — ACETAMINOPHEN 325 MG PO TABS
650.0000 mg | ORAL_TABLET | Freq: Once | ORAL | Status: AC
  Administered 2024-03-14: 650 mg via ORAL
  Filled 2024-03-14: qty 2

## 2024-03-14 MED ORDER — SODIUM CHLORIDE 0.9 % IV SOLN: INTRAVENOUS | Status: DC

## 2024-03-14 MED ORDER — IRON SUCROSE 300 MG IVPB - SIMPLE MED
300.0000 mg | Freq: Once | Status: AC
  Administered 2024-03-14: 300 mg via INTRAVENOUS
  Filled 2024-03-14: qty 300

## 2024-03-14 NOTE — Patient Instructions (Signed)

## 2024-03-17 ENCOUNTER — Other Ambulatory Visit: Payer: Self-pay | Admitting: Hematology

## 2024-03-17 ENCOUNTER — Other Ambulatory Visit: Payer: Self-pay

## 2024-03-17 MED ORDER — POTASSIUM CHLORIDE CRYS ER 20 MEQ PO TBCR
20.0000 meq | EXTENDED_RELEASE_TABLET | Freq: Two times a day (BID) | ORAL | 0 refills | Status: DC
  Filled 2024-03-17: qty 20, 10d supply, fill #0

## 2024-03-21 ENCOUNTER — Inpatient Hospital Stay

## 2024-03-23 ENCOUNTER — Other Ambulatory Visit (HOSPITAL_COMMUNITY): Payer: Self-pay

## 2024-03-23 ENCOUNTER — Other Ambulatory Visit: Payer: Self-pay | Admitting: Hematology

## 2024-03-24 ENCOUNTER — Other Ambulatory Visit (HOSPITAL_COMMUNITY): Payer: Self-pay

## 2024-03-24 ENCOUNTER — Inpatient Hospital Stay

## 2024-03-24 DIAGNOSIS — D500 Iron deficiency anemia secondary to blood loss (chronic): Secondary | ICD-10-CM | POA: Diagnosis not present

## 2024-03-24 DIAGNOSIS — N920 Excessive and frequent menstruation with regular cycle: Secondary | ICD-10-CM | POA: Diagnosis not present

## 2024-03-24 DIAGNOSIS — D509 Iron deficiency anemia, unspecified: Secondary | ICD-10-CM

## 2024-03-24 DIAGNOSIS — D508 Other iron deficiency anemias: Secondary | ICD-10-CM

## 2024-03-24 MED ORDER — METFORMIN HCL 500 MG PO TABS
500.0000 mg | ORAL_TABLET | Freq: Two times a day (BID) | ORAL | 1 refills | Status: AC
  Filled 2024-03-24: qty 180, 90d supply, fill #0

## 2024-03-24 MED ORDER — DIPHENHYDRAMINE HCL 25 MG PO CAPS
25.0000 mg | ORAL_CAPSULE | Freq: Once | ORAL | Status: AC
  Administered 2024-03-24: 25 mg via ORAL
  Filled 2024-03-24: qty 1

## 2024-03-24 MED ORDER — SODIUM CHLORIDE 0.9 % IV SOLN: INTRAVENOUS | Status: DC

## 2024-03-24 MED ORDER — IRON SUCROSE 300 MG IVPB - SIMPLE MED
300.0000 mg | Freq: Once | Status: AC
  Administered 2024-03-24: 300 mg via INTRAVENOUS
  Filled 2024-03-24: qty 300

## 2024-03-24 MED ORDER — ACETAMINOPHEN 325 MG PO TABS
650.0000 mg | ORAL_TABLET | Freq: Once | ORAL | Status: AC
  Administered 2024-03-24: 650 mg via ORAL
  Filled 2024-03-24: qty 2

## 2024-03-24 NOTE — Patient Instructions (Signed)

## 2024-03-25 ENCOUNTER — Other Ambulatory Visit (HOSPITAL_COMMUNITY): Payer: Self-pay

## 2024-03-26 ENCOUNTER — Other Ambulatory Visit (HOSPITAL_COMMUNITY): Payer: Self-pay

## 2024-03-26 ENCOUNTER — Encounter: Payer: Self-pay | Admitting: Physician Assistant

## 2024-03-30 ENCOUNTER — Ambulatory Visit (INDEPENDENT_AMBULATORY_CARE_PROVIDER_SITE_OTHER): Admitting: Obstetrics and Gynecology

## 2024-03-30 ENCOUNTER — Other Ambulatory Visit (HOSPITAL_COMMUNITY): Payer: Self-pay

## 2024-03-30 VITALS — BP 140/70 | HR 84 | Ht 64.76 in | Wt 219.8 lb

## 2024-03-30 DIAGNOSIS — D219 Benign neoplasm of connective and other soft tissue, unspecified: Secondary | ICD-10-CM

## 2024-03-30 DIAGNOSIS — N920 Excessive and frequent menstruation with regular cycle: Secondary | ICD-10-CM

## 2024-03-30 DIAGNOSIS — Z01818 Encounter for other preprocedural examination: Secondary | ICD-10-CM | POA: Diagnosis not present

## 2024-03-30 MED ORDER — METOCLOPRAMIDE HCL 10 MG PO TABS
10.0000 mg | ORAL_TABLET | Freq: Three times a day (TID) | ORAL | 0 refills | Status: DC | PRN
  Filled 2024-03-30: qty 5, 2d supply, fill #0

## 2024-03-30 MED ORDER — IBUPROFEN 800 MG PO TABS
800.0000 mg | ORAL_TABLET | Freq: Three times a day (TID) | ORAL | 1 refills | Status: DC | PRN
  Filled 2024-03-30: qty 30, 10d supply, fill #0

## 2024-03-30 MED ORDER — OXYCODONE HCL 5 MG PO TABS
5.0000 mg | ORAL_TABLET | ORAL | 0 refills | Status: DC | PRN
  Filled 2024-03-30: qty 10, 2d supply, fill #0

## 2024-04-01 ENCOUNTER — Other Ambulatory Visit: Payer: Self-pay | Admitting: Hematology

## 2024-04-02 ENCOUNTER — Other Ambulatory Visit (HOSPITAL_COMMUNITY): Payer: Self-pay

## 2024-04-02 MED ORDER — POTASSIUM CHLORIDE CRYS ER 20 MEQ PO TBCR
20.0000 meq | EXTENDED_RELEASE_TABLET | Freq: Two times a day (BID) | ORAL | 0 refills | Status: DC
  Filled 2024-04-02: qty 20, 10d supply, fill #0

## 2024-04-03 ENCOUNTER — Encounter: Payer: Self-pay | Admitting: Obstetrics and Gynecology

## 2024-04-03 NOTE — Patient Instructions (Signed)
 Surgery to Remove Fibroids in the Uterus: What to Know After After surgery to remove fibroids, you may have pain in the belly and in the areas where cuts were made. You may also have bleeding in the vagina. Follow these instructions at home: Medicines Take your medicines only as told. If you were given antibiotics, take them as told. Do not stop taking them even if you start to feel better. You may need to take steps to help treat or prevent trouble pooping (constipation), such as: Taking medicines to help you poop. Eating foods high in fiber, like beans, whole grains, and fresh fruits and vegetables. Drinking more fluids as told. Ask your health care provider if it's safe to drive or use machines while taking your medicine. Incision care  Take care of your cut from surgery as told. Make sure you: Wash your hands with soap and water for at least 20 seconds before and after you change your bandage. If you can't use soap and water, use hand sanitizer. Change your bandage. Leave stitches, staples, or skin glue alone. Leave tape strips alone unless you're told to take them off. You may trim the edges of the tape strips if they curl up. Check the area around your cut from surgery every day for signs of infection. Check for: More redness, swelling, or pain. More fluid or blood. Warmth. Pus or a bad smell. Activity Rest as told. Get up to take short walks at least every 2 hours during the day. This helps you breathe better and keeps your blood flowing. Ask for help if you feel weak or unsteady. Do not douche or use tampons. Do not have sex or put anything into the vagina until your provider says it's OK. You may have to avoid lifting. Ask how much you can safely lift. Ask what things are safe for you to do at home. Ask when you can go back to work or school. General instructions Do not smoke, vape, or use nicotine or tobacco. Do not take baths, swim, or use a hot tub until you're told it's  OK. Ask if you can shower. Wear compression stockings to reduce swelling and help prevent blood clots in your legs. Keep all follow-up visits. Your provider needs to watch for fibroids that come back. They also want to make sure that you're healing well from the surgery. Contact a health care provider if: You have any of the above signs of infection. You have a rash. You have pain when you pee, or you have blood in your pee. You throw up or feel like throwing up. You have watery poop (diarrhea). Get help right away if: You have trouble breathing or shortness of breath. You have chest pain. You feel weak or light-headed. You have pain, swelling, or redness in your legs. You have very bad bleeding from your vagina, or have blood clots that are larger than a quarter in size. Your cuts from surgery open up. You have pain in the belly that's getting worse and medicines don't help. These symptoms may be an emergency. Call 911 right away. Do not wait to see if the symptoms will go away. Do not drive yourself to the hospital. This information is not intended to replace advice given to you by your health care provider. Make sure you discuss any questions you have with your health care provider. Document Revised: 12/11/2022 Document Reviewed: 12/11/2022 Elsevier Patient Education  2024 [employer].

## 2024-04-03 NOTE — H&P (View-Only) (Signed)
 Tonya Gilbert [birth year] [id number]  Surgical Consult from [facility] Here today for preop for robotic operative myomectomy, olympus bag with morcellation, chromopertubation, myosure D&C, ECC  Per her last visit: History:  38 y.o. G0 presents for annual exam. Hx of multiple fibroids, previously expelled IUD, interested in pregnancy. Would like to do Egg retrieval and IVF with donor sperm. Not currently sexually active and would like to move forward with a pregnancy in the following year, but she would like to reduce her blood loss with the myomectomy.  Today, patient reports her periods last for 7 days each month.  The first day she is using a pad an hour and the second day she is changing a pad Q2-3 hours and the third changing Q3-4 hours and the last few days she is using 2ppd. She denies any dysmenorrhea and her blood count has been as low as hg 5.4. Last one in Feb was at 10.2    Gynecologic History Patient's last menstrual period was 03/16/2024 (exact date). Period Cycle (Days): 28 Period Duration (Days): 7 Period Pattern: Regular Menstrual Flow: Heavy, Moderate Menstrual Control: Maxi pad, Thin pad Dysmenorrhea: (!) Mild Dysmenorrhea Symptoms: Cramping, Headache Contraception/Family planning: none Sexually active: yes Last Pap: 2022 per pt.   Obstetric History OB History  Gravida Para Term Preterm AB Living  0 0 0 0 0 0  SAB IAB Ectopic Multiple Live Births  0 0 0 0 0      Past Medical History:  Diagnosis Date   Anemia    Diabetes mellitus without complication (HCC)    No past surgical history on file. Social History   Socioeconomic History   Marital status: Single    Spouse name: Not on file   Number of children: Not on file   Years of education: Not on file   Highest education level: Not on file  Occupational History   Not on file  Tobacco Use   Smoking status: Never    Passive exposure: Never   Smokeless tobacco: Never  Vaping Use   Vaping status:  Never Used  Substance and Sexual Activity   Alcohol use: No   Drug use: No   Sexual activity: Not Currently    Partners: Male    Birth control/protection: Abstinence    Comment: menarche 38yo, sexual debut 38yo  Other Topics Concern   Not on file  Social History Narrative   Not on file   Social Drivers of Health   Financial Resource Strain: Not on file  Food Insecurity: Not on file  Transportation Needs: Not on file  Physical Activity: Not on file  Stress: Not on file  Social Connections: Not on file   OB History     Gravida  0   Para  0   Term  0   Preterm  0   AB  0   Living  0      SAB  0   IAB  0   Ectopic  0   Multiple  0   Live Births  0          Current Outpatient Medications on File Prior to Visit  Medication Sig Dispense Refill   iron  polysaccharides (FERREX 150) 150 MG capsule Take 1 capsule (150 mg total) by mouth daily. 60 capsule 5   metFORMIN  (GLUCOPHAGE ) 500 MG tablet Take 1 tablet (500 mg total) by mouth 2 (two) times daily. 180 tablet 1   Multiple Vitamins-Minerals (MULTIVITAMIN) tablet Take 1 tablet by mouth  daily.     norethindrone  (MICRONOR ) 0.35 MG tablet Take 1 tablet (0.35 mg total) by mouth daily. 84 tablet 6   Semaglutide , 2 MG/DOSE, (OZEMPIC , 2 MG/DOSE,) 8 MG/3ML SOPN Inject 2 mg into the skin once a week. 12 mL 3   [DISCONTINUED] norethindrone -ethinyl estradiol (BLISOVI FE 1/20) 1-20 MG-MCG tablet Take 1 tablet by mouth daily. 28 tablet 0   No current facility-administered medications on file prior to visit.   Allergies  Allergen Reactions   Bee Venom Anaphylaxis   Mushroom Extract Complex (Obsolete) Cough, Itching and Shortness Of Breath   Penicillins Itching and Rash     Review of Systems  All other systems reviewed and are negative.    Exam:  Vitals:   03/30/24 1611  BP: (!) 140/70  Pulse: 84  SpO2: 95%  Weight: 219 lb 12.8 oz (99.7 kg)  Height: 5' 4.76 (1.645 m)   Body mass index is 36.84  kg/m.  Last exam with Jami uterus enlarged 18cm PUS  13.94cm Multiple fibroids. Six measured fibroids: 4.97, 4.33, 3.31, 4.46, 1.36, 2.06cm Endometrial lining 5.38mm RO 25x71mm simple avascular cyst LO WNL No adnexal masses No free fluid  Assessment/Plan:   Symptomatic fibroids Anemia menorrhagia Desires future fertility  Counseled on the robotic myomectomy and the risks and benefits were discussed in detail.  Counseled on the risk of 50% of recurrent fibroids in 5 years, risk for bleeding, risk for an additional surgery, or laparotomy. Discussed that risk that some fibroids may still remain, even with a myomectomy, especially with small fibroids not in view and in the event of excess bleeding.  Discussed using the MyoSure D&C and performing chromopertubation as well.  The chromopertubation is important as there can be crush injuries from the current fibroids, and help to flush any debris out of the fallopian tubes before conception.  We also discussed using the Olympus bag and morcellator, which is the only method used for and approved with the FDA for morcellation.  Discussed she will need to be on birth control and allow her uterus to heal after the myomectomy for  6 months afterwards before trying to conceive.  Discussed invasion of 50% of the Uterine muscle would require a C-section and delivery at 37 weeks.  She voiced understanding and wanted to proceed with the procedure.  A surgical request was sent. Referral has been placed with Dr. Yalcinkaya with [facility] as well. To begin micronor  daily to help reduce her current bleeding and increase her hg before surgery.  Discussed she will have abnormal bleeding after surgery, as the uterus heals.   The risks of surgery were discussed in detail with the patient including but not limited to: bleeding which may require transfusion or reoperation; infection which may require prolonged hospitalization or re-hospitalization and antibiotic therapy;  injury to bowel, bladder, ureters and major vessels or other surrounding organs which may lead to other procedures; formation of adhesions; need for additional procedures including laparotomy or subsequent procedures secondary to intraoperative injury or abnormal pathology; thromboembolic phenomenon; incisional problems and other postoperative or anesthesia complications.  The postoperative expectations were also discussed in detail. The patient also understands the alternative treatment options which were discussed in full. All questions were answered.  Patient would like to proceed with the procedure.   30 minutes spent on reviewing records, imaging,  and one on one patient time and counseling patient and documentation Dr. Glennon   30 minutes spent on reviewing records, imaging,  and one on one patient  time and counseling patient and documentation Dr. Glennon   No follow-ups on file.  Almarie MARLA Glennon WHNP-BC 5:14 PM 04/03/2024

## 2024-04-03 NOTE — Progress Notes (Signed)
 Tonya Gilbert [birth year] [id number]  Surgical Consult from [facility] Here today for preop for robotic operative myomectomy, olympus bag with morcellation, chromopertubation, myosure D&C, ECC  Per her last visit: History:  38 y.o. G0 presents for annual exam. Hx of multiple fibroids, previously expelled IUD, interested in pregnancy. Would like to do Egg retrieval and IVF with donor sperm. Not currently sexually active and would like to move forward with a pregnancy in the following year, but she would like to reduce her blood loss with the myomectomy.  Today, patient reports her periods last for 7 days each month.  The first day she is using a pad an hour and the second day she is changing a pad Q2-3 hours and the third changing Q3-4 hours and the last few days she is using 2ppd. She denies any dysmenorrhea and her blood count has been as low as hg 5.4. Last one in Feb was at 10.2    Gynecologic History Patient's last menstrual period was 03/16/2024 (exact date). Period Cycle (Days): 28 Period Duration (Days): 7 Period Pattern: Regular Menstrual Flow: Heavy, Moderate Menstrual Control: Maxi pad, Thin pad Dysmenorrhea: (!) Mild Dysmenorrhea Symptoms: Cramping, Headache Contraception/Family planning: none Sexually active: yes Last Pap: 2022 per pt.   Obstetric History OB History  Gravida Para Term Preterm AB Living  0 0 0 0 0 0  SAB IAB Ectopic Multiple Live Births  0 0 0 0 0      Past Medical History:  Diagnosis Date   Anemia    Diabetes mellitus without complication (HCC)    No past surgical history on file. Social History   Socioeconomic History   Marital status: Single    Spouse name: Not on file   Number of children: Not on file   Years of education: Not on file   Highest education level: Not on file  Occupational History   Not on file  Tobacco Use   Smoking status: Never    Passive exposure: Never   Smokeless tobacco: Never  Vaping Use   Vaping status:  Never Used  Substance and Sexual Activity   Alcohol use: No   Drug use: No   Sexual activity: Not Currently    Partners: Male    Birth control/protection: Abstinence    Comment: menarche 38yo, sexual debut 38yo  Other Topics Concern   Not on file  Social History Narrative   Not on file   Social Drivers of Health   Financial Resource Strain: Not on file  Food Insecurity: Not on file  Transportation Needs: Not on file  Physical Activity: Not on file  Stress: Not on file  Social Connections: Not on file   OB History     Gravida  0   Para  0   Term  0   Preterm  0   AB  0   Living  0      SAB  0   IAB  0   Ectopic  0   Multiple  0   Live Births  0          Current Outpatient Medications on File Prior to Visit  Medication Sig Dispense Refill   iron  polysaccharides (FERREX 150) 150 MG capsule Take 1 capsule (150 mg total) by mouth daily. 60 capsule 5   metFORMIN  (GLUCOPHAGE ) 500 MG tablet Take 1 tablet (500 mg total) by mouth 2 (two) times daily. 180 tablet 1   Multiple Vitamins-Minerals (MULTIVITAMIN) tablet Take 1 tablet by mouth  daily.     norethindrone  (MICRONOR ) 0.35 MG tablet Take 1 tablet (0.35 mg total) by mouth daily. 84 tablet 6   Semaglutide , 2 MG/DOSE, (OZEMPIC , 2 MG/DOSE,) 8 MG/3ML SOPN Inject 2 mg into the skin once a week. 12 mL 3   [DISCONTINUED] norethindrone -ethinyl estradiol (BLISOVI FE 1/20) 1-20 MG-MCG tablet Take 1 tablet by mouth daily. 28 tablet 0   No current facility-administered medications on file prior to visit.   Allergies  Allergen Reactions   Bee Venom Anaphylaxis   Mushroom Extract Complex (Obsolete) Cough, Itching and Shortness Of Breath   Penicillins Itching and Rash     Review of Systems  All other systems reviewed and are negative.    Exam:  Vitals:   03/30/24 1611  BP: (!) 140/70  Pulse: 84  SpO2: 95%  Weight: 219 lb 12.8 oz (99.7 kg)  Height: 5' 4.76 (1.645 m)   Body mass index is 36.84  kg/m.  Last exam with Jami uterus enlarged 18cm PUS  13.94cm Multiple fibroids. Six measured fibroids: 4.97, 4.33, 3.31, 4.46, 1.36, 2.06cm Endometrial lining 5.38mm RO 25x71mm simple avascular cyst LO WNL No adnexal masses No free fluid  Assessment/Plan:   Symptomatic fibroids Anemia menorrhagia Desires future fertility  Counseled on the robotic myomectomy and the risks and benefits were discussed in detail.  Counseled on the risk of 50% of recurrent fibroids in 5 years, risk for bleeding, risk for an additional surgery, or laparotomy. Discussed that risk that some fibroids may still remain, even with a myomectomy, especially with small fibroids not in view and in the event of excess bleeding.  Discussed using the MyoSure D&C and performing chromopertubation as well.  The chromopertubation is important as there can be crush injuries from the current fibroids, and help to flush any debris out of the fallopian tubes before conception.  We also discussed using the Olympus bag and morcellator, which is the only method used for and approved with the FDA for morcellation.  Discussed she will need to be on birth control and allow her uterus to heal after the myomectomy for  6 months afterwards before trying to conceive.  Discussed invasion of 50% of the Uterine muscle would require a C-section and delivery at 37 weeks.  She voiced understanding and wanted to proceed with the procedure.  A surgical request was sent. Referral has been placed with Dr. Yalcinkaya with [facility] as well. To begin micronor  daily to help reduce her current bleeding and increase her hg before surgery.  Discussed she will have abnormal bleeding after surgery, as the uterus heals.   The risks of surgery were discussed in detail with the patient including but not limited to: bleeding which may require transfusion or reoperation; infection which may require prolonged hospitalization or re-hospitalization and antibiotic therapy;  injury to bowel, bladder, ureters and major vessels or other surrounding organs which may lead to other procedures; formation of adhesions; need for additional procedures including laparotomy or subsequent procedures secondary to intraoperative injury or abnormal pathology; thromboembolic phenomenon; incisional problems and other postoperative or anesthesia complications.  The postoperative expectations were also discussed in detail. The patient also understands the alternative treatment options which were discussed in full. All questions were answered.  Patient would like to proceed with the procedure.   30 minutes spent on reviewing records, imaging,  and one on one patient time and counseling patient and documentation Dr. Glennon   30 minutes spent on reviewing records, imaging,  and one on one patient  time and counseling patient and documentation Dr. Glennon   No follow-ups on file.  Almarie MARLA Glennon WHNP-BC 5:14 PM 04/03/2024

## 2024-04-06 ENCOUNTER — Encounter: Payer: Self-pay | Admitting: *Deleted

## 2024-04-14 ENCOUNTER — Encounter (HOSPITAL_COMMUNITY): Payer: Self-pay | Admitting: Obstetrics and Gynecology

## 2024-04-14 NOTE — Progress Notes (Signed)
 Spoke w/ via phone for pre-op interview--- pt Lab needs dos----  upt / t&s/ prepare RBC      Lab results------ lab appt 04-16-2024 @ 1300 getting CBC/ BMP/ EKG COVID test -----patient states asymptomatic no test needed Arrive at ------- 0530 on 04-21-2024 NPO after MN w/ exception sips of water w/ meds Pre-Surgery Ensure or G2: n/a  Med rec completed Medications to take morning of surgery ----- none Diabetic medication ----- do not take metformin  morning of surgery  GLP1 agonist last dose: 04-08-2024 GLP1 instructions: pt stated was given instructions by dr boswell to stop week prior to surgery  Patient instructed no nail polish to be worn day of surgery Patient instructed to bring photo id and insurance card day of surgery Patient aware to have Driver (ride ) / caregiver    for 24 hours after surgery - brother-n-law, gregory zigbuc  Patient Special Instructions ----- will pick up soap and written instructions at lab appt Pre-Op special Instructions ----- n/a  Patient verbalized understanding of instructions that were given at this phone interview. Patient denies chest pain, sob, fever, cough at the interview.

## 2024-04-14 NOTE — Pre-Procedure Instructions (Signed)
 Surgical Instructions  Your procedure is scheduled on :  Tuesday,  04-21-2024 Report to [facility] Main Entrance A at 5:30 AM, then check in the Admitting office. Any questions or running late day of surgery :  call [phone]  Questions prior to your surgery day:  call [phone], Monday -- Friday 8am - 4pm. If you experience any cold or flu symptoms such as cough, fever, chills, shortness of breath, etc. between now and you scheduled surgery, please notify your surgeon office.   Remember: Do Not eat any food and Do Not drink any liquids after midnight the night before surgery.  This includes No water,  candy,  gum, and mints.  Do Not take oral diabetes medicine , metformin , morning of surgery.  Take these medicines the morning of surgery with A SIPS OF WATER:  NONE   May take these medicines IF NEEDED:  NONE   One week prior to surgery, STOP taking any Aspirin (unless otherwise instructed by your surgeon) Aleve, Naproxen, ibuprofen , Motrin , Advil , Goody's, BC's, all herbal medications/ supplements, fish oil, and non-prescription vitamins.  Do NOT Smoke (tobacco/ vaping) and Do Not drink alcohol for 24 hours prior to your procedure.  For those patients that use a CPAP.  Please bring your CPAP/ mask/ tubing with them day of surgery . Anesthesia may ask recovery room nurse to use and if you stay the night you be asked to use it.  You will be asked to removed any contacts, glasses, piercing's, hearing aid's, dentures/ partials prior to surgery.  Please bring cases/ container/ solution/ etc., for them day of surgery.   Patients discharged the day of surgery will NOT be allowed to drive home.  You must have responsible driver and caregiver to stay at home with you the next 24 hours.  SURGICAL WAITING ROOM VISITATION Patients may have no more than 2 support people in the waiting area - if more than 2 , these visitors may rotate.  Pre-op nurse will coordinate an appropriate time for 1  Adult support person, who may not rotate, to accompany patient in pre-op.  Aware some patients may have certain circumstances, speak to pre-op nurse day of surgery.  Children under the age 11 must have an adult with them who is not the patient and must remain in the main waiting area with an adult.  If the patient needs to stay at the hospital during part of their recovery, the visitor guidelines for inpatient rooms apply.  Please refer to the [facility] website for the visitor guidelines for any additional information.  If you received a COVID test during your pre-op visit it is requested that you wear a mask when out in public, stay away from anyone that may not be feeling well and notify your surgeon if you develop symptoms.  If you have been in contact with anyone that has tested positive in the past 10 days notify your surgeon.     [place] - Preparing for Surgery  Before surgery, you can play an important role. Because skin is not sterile, it needs to be as free of germs as possible. You can reduce the number of germs on your skin by washing with CHG (chlorhexidine gluconate) soap before surgery. CHG is an antiseptic cleaner which kills germs and bonds with the skin to continue killing germs even after washing. Oral hygiene is also important in reducing the risk of infection. Remember to brush your teeth with your regular toothpaste the morning of surgery.  Please DO NOT use if you have an allergy to CHG or antibacterial soaps. If your skin becomes reddened/irritated stop using the CHG and inform your Pre-op nurse day of surgery.  DO NOT shave (including legs and genital area) for at least 48 hours prior to your CHG shower.   Please follow these instructions carefully:  Shower with CHG soap the night before surgery. If you choose to wash your hair, wash your hair first as usual with your normal shampoo. After you shampoo, rinse your hair and body thoroughly to remove the  shampoo. Use CHG as you would any other liquid soap. You can apply CHG directly to the skin and wash gently with a clean washcloth or shower sponge. Apply the CHG soap to your body ONLY FROM THE NECK DOWN. Do not use on open wounds or open sores. Avoid contact with your eyes, ears, mouth, and genitals (private parts). Wash genitals (private parts) with your normal soap. Wash thoroughly, paying special attention to the area where your surgery will be performed. Thoroughly rinse your body with warm water from the neck down. DO NOT shower/wash with your normal soap after using and rinsing off the CHG soap. DO NOT use lotions, oils, etc., after showering with CHG. Pat yourself dry with a clean towel. Wear clean pajamas. Place clean sheets on your bed the night of your CHG shower and do not sleep with pets.  Day of Surgery  DO NOT Apply any lotions,  powder,  oils,  deodorants (may use underarm deodorant),  cologne/  perfumes  or makeup Do Not wear jewelry /  piercing's/  metal/  permanent jewelry must be removed prior to arrival day of surgery. (No plastic piercing) Do Not wear nail polish,  gel polish,  artificial nails, or any other type of covering on natural finger nails (toe nails are okay) Remember to brush your teeth and rinse mouth out. Put on clean / comfortable clothes.  is not responsible for valuables/ personal belongings

## 2024-04-16 ENCOUNTER — Encounter (HOSPITAL_COMMUNITY)
Admission: RE | Admit: 2024-04-16 | Discharge: 2024-04-16 | Disposition: A | Discharge status: Home / Self Care | Source: Ambulatory Visit | Attending: Obstetrics and Gynecology | Admitting: Obstetrics and Gynecology

## 2024-04-16 ENCOUNTER — Ambulatory Visit: Payer: Self-pay | Admitting: Obstetrics and Gynecology

## 2024-04-16 DIAGNOSIS — Z01818 Encounter for other preprocedural examination: Secondary | ICD-10-CM | POA: Insufficient documentation

## 2024-04-16 DIAGNOSIS — D500 Iron deficiency anemia secondary to blood loss (chronic): Secondary | ICD-10-CM | POA: Diagnosis not present

## 2024-04-16 DIAGNOSIS — R7303 Prediabetes: Secondary | ICD-10-CM

## 2024-04-16 LAB — CBC
HCT: 37.7 % (ref 36.0–46.0)
Hemoglobin: 11.4 g/dL — ABNORMAL LOW (ref 12.0–15.0)
MCH: 23.8 pg — ABNORMAL LOW (ref 26.0–34.0)
MCHC: 30.2 g/dL (ref 30.0–36.0)
MCV: 78.5 fL — ABNORMAL LOW (ref 80.0–100.0)
Platelets: 296 10*3/uL (ref 150–400)
RBC: 4.8 MIL/uL (ref 3.87–5.11)
RDW: 20.9 % — ABNORMAL HIGH (ref 11.5–15.5)
WBC: 4.3 10*3/uL (ref 4.0–10.5)
nRBC: 0 % (ref 0.0–0.2)

## 2024-04-16 LAB — BASIC METABOLIC PANEL WITH GFR
Anion gap: 10 (ref 5–15)
BUN: 8 mg/dL (ref 6–20)
CO2: 27 mmol/L (ref 22–32)
Calcium: 9.3 mg/dL (ref 8.9–10.3)
Chloride: 100 mmol/L (ref 98–111)
Creatinine, Ser: 0.83 mg/dL (ref 0.44–1.00)
GFR, Estimated: >60 mL/min
Glucose, Bld: 108 mg/dL — ABNORMAL HIGH (ref 70–99)
Potassium: 3.2 mmol/L — ABNORMAL LOW (ref 3.5–5.1)
Sodium: 137 mmol/L (ref 135–145)

## 2024-04-16 LAB — HEMOGLOBIN A1C
Hgb A1c MFr Bld: 5.9 % — ABNORMAL HIGH (ref 4.8–5.6)
Mean Plasma Glucose: 122.63 mg/dL

## 2024-04-20 NOTE — Anesthesia Preprocedure Evaluation (Signed)
 Anesthesia Evaluation  Patient identified by MRN, date of birth, ID band Patient awake    Reviewed: Allergy & Precautions, H&P , NPO status , Patient's Chart, lab work & pertinent test results  Airway Mallampati: III  TM Distance: >3 FB Neck ROM: Full    Dental no notable dental hx. (+) Teeth Intact, Dental Advisory Given   Pulmonary neg pulmonary ROS   Pulmonary exam normal breath sounds clear to auscultation       Cardiovascular Exercise Tolerance: Good negative cardio ROS  Rhythm:Regular Rate:Normal     Neuro/Psych negative neurological ROS  negative psych ROS   GI/Hepatic negative GI ROS, Neg liver ROS,,,  Endo/Other  diabetes, Type 2, Oral Hypoglycemic Agents  Class 3 obesity  Renal/GU negative Renal ROS  negative genitourinary   Musculoskeletal   Abdominal   Peds  Hematology  (+) Blood dyscrasia, anemia   Anesthesia Other Findings   Reproductive/Obstetrics negative OB ROS                              Anesthesia Physical Anesthesia Plan  ASA: 3  Anesthesia Plan: General   Post-op Pain Management: Tylenol  PO (pre-op)*   Induction: Intravenous  PONV Risk Score and Plan: 4 or greater and Ondansetron, Dexamethasone, Midazolam and Scopolamine patch - Pre-op  Airway Management Planned: Oral ETT  Additional Equipment:   Intra-op Plan:   Post-operative Plan: Extubation in OR  Informed Consent: I have reviewed the patients History and Physical, chart, labs and discussed the procedure including the risks, benefits and alternatives for the proposed anesthesia with the patient or authorized representative who has indicated his/her understanding and acceptance.     Dental advisory given  Plan Discussed with: CRNA  Anesthesia Plan Comments:          Anesthesia Quick Evaluation

## 2024-04-21 ENCOUNTER — Encounter (HOSPITAL_COMMUNITY)
Admission: RE | Disposition: A | Discharge status: Home / Self Care | Payer: Self-pay | Source: Home / Self Care | Attending: Obstetrics and Gynecology

## 2024-04-21 ENCOUNTER — Encounter (HOSPITAL_COMMUNITY): Payer: Self-pay | Admitting: Anesthesiology

## 2024-04-21 ENCOUNTER — Ambulatory Visit (HOSPITAL_COMMUNITY): Payer: Self-pay | Admitting: Anesthesiology

## 2024-04-21 ENCOUNTER — Ambulatory Visit (HOSPITAL_COMMUNITY)

## 2024-04-21 ENCOUNTER — Other Ambulatory Visit: Payer: Self-pay

## 2024-04-21 ENCOUNTER — Encounter (HOSPITAL_COMMUNITY): Payer: Self-pay | Admitting: Obstetrics and Gynecology

## 2024-04-21 ENCOUNTER — Ambulatory Visit (HOSPITAL_COMMUNITY)
Admission: RE | Admit: 2024-04-21 | Discharge: 2024-04-21 | Disposition: A | Discharge status: Home / Self Care | Attending: Obstetrics and Gynecology | Admitting: Obstetrics and Gynecology

## 2024-04-21 DIAGNOSIS — E66813 Obesity, class 3: Secondary | ICD-10-CM | POA: Diagnosis not present

## 2024-04-21 DIAGNOSIS — N946 Dysmenorrhea, unspecified: Secondary | ICD-10-CM | POA: Diagnosis not present

## 2024-04-21 DIAGNOSIS — Z6837 Body mass index (BMI) 37.0-37.9, adult: Secondary | ICD-10-CM | POA: Insufficient documentation

## 2024-04-21 DIAGNOSIS — N920 Excessive and frequent menstruation with regular cycle: Secondary | ICD-10-CM | POA: Insufficient documentation

## 2024-04-21 DIAGNOSIS — E119 Type 2 diabetes mellitus without complications: Secondary | ICD-10-CM | POA: Insufficient documentation

## 2024-04-21 DIAGNOSIS — Z7984 Long term (current) use of oral hypoglycemic drugs: Secondary | ICD-10-CM | POA: Diagnosis not present

## 2024-04-21 DIAGNOSIS — D219 Benign neoplasm of connective and other soft tissue, unspecified: Secondary | ICD-10-CM | POA: Diagnosis not present

## 2024-04-21 DIAGNOSIS — Z7985 Long-term (current) use of injectable non-insulin antidiabetic drugs: Secondary | ICD-10-CM | POA: Diagnosis not present

## 2024-04-21 DIAGNOSIS — D259 Leiomyoma of uterus, unspecified: Secondary | ICD-10-CM | POA: Diagnosis not present

## 2024-04-21 DIAGNOSIS — D500 Iron deficiency anemia secondary to blood loss (chronic): Secondary | ICD-10-CM

## 2024-04-21 DIAGNOSIS — D250 Submucous leiomyoma of uterus: Secondary | ICD-10-CM | POA: Diagnosis not present

## 2024-04-21 DIAGNOSIS — Z01818 Encounter for other preprocedural examination: Secondary | ICD-10-CM

## 2024-04-21 DIAGNOSIS — D649 Anemia, unspecified: Secondary | ICD-10-CM | POA: Diagnosis not present

## 2024-04-21 HISTORY — DX: Type 2 diabetes mellitus without complications: E11.9

## 2024-04-21 HISTORY — DX: Excessive and frequent menstruation with regular cycle: N92.0

## 2024-04-21 HISTORY — DX: Leiomyoma of uterus, unspecified: D25.9

## 2024-04-21 HISTORY — PX: CHROMOPERTUBATION: SHX6288

## 2024-04-21 HISTORY — DX: Hyperlipidemia, unspecified: E78.5

## 2024-04-21 HISTORY — PX: ROBOT ASSISTED MYOMECTOMY: SHX5142

## 2024-04-21 HISTORY — PX: MYOSURE RESECTION: SHX7611

## 2024-04-21 HISTORY — DX: Dysmenorrhea, unspecified: N94.6

## 2024-04-21 HISTORY — DX: Attention-deficit hyperactivity disorder, unspecified type: F90.9

## 2024-04-21 HISTORY — DX: Iron deficiency anemia secondary to blood loss (chronic): D50.0

## 2024-04-21 HISTORY — PX: DILATATION & CURRETTAGE/HYSTEROSCOPY WITH RESECTOCOPE: SHX5572

## 2024-04-21 LAB — GLUCOSE, CAPILLARY
Glucose-Capillary: 137 mg/dL — ABNORMAL HIGH (ref 70–99)
Glucose-Capillary: 253 mg/dL — ABNORMAL HIGH (ref 70–99)
Glucose-Capillary: 241 mg/dL — ABNORMAL HIGH (ref 70–99)
Glucose-Capillary: 243 mg/dL — ABNORMAL HIGH (ref 70–99)

## 2024-04-21 LAB — POCT PREGNANCY, URINE: Preg Test, Ur: NEGATIVE

## 2024-04-21 LAB — PREPARE RBC (CROSSMATCH)

## 2024-04-21 SURGERY — MYOMECTOMY, ROBOT-ASSISTED
Anesthesia: General

## 2024-04-21 MED ORDER — SODIUM CHLORIDE (PF) 0.9 % IJ SOLN
INTRAMUSCULAR | Status: AC
Start: 2024-04-21 — End: 2024-04-21
  Filled 2024-04-21: qty 100

## 2024-04-21 MED ORDER — TRANEXAMIC ACID-NACL 1000-0.7 MG/100ML-% IV SOLN
1000.0000 mg | Freq: Once | INTRAVENOUS | Status: AC
Start: 2024-04-21 — End: 2024-04-21
  Administered 2024-04-21 (×2): 1000 mg via INTRAVENOUS
  Filled 2024-04-21: qty 100

## 2024-04-21 MED ORDER — BUPIVACAINE HCL (PF) 0.5 % IJ SOLN
INTRAMUSCULAR | Status: AC
  Filled 2024-04-21: qty 60

## 2024-04-21 MED ORDER — PROPOFOL 10 MG/ML IV BOLUS
INTRAVENOUS | Status: AC
Start: 2024-04-21 — End: 2024-04-21
  Filled 2024-04-21: qty 20

## 2024-04-21 MED ORDER — PHENYLEPHRINE 80 MCG/ML (10ML) SYRINGE FOR IV PUSH (FOR BLOOD PRESSURE SUPPORT)
PREFILLED_SYRINGE | INTRAVENOUS | Status: AC
  Filled 2024-04-21: qty 10

## 2024-04-21 MED ORDER — LIDOCAINE 2% (20 MG/ML) 5 ML SYRINGE
INTRAMUSCULAR | Status: AC
  Filled 2024-04-21: qty 5

## 2024-04-21 MED ORDER — MISOPROSTOL 200 MCG PO TABS
600.0000 ug | ORAL_TABLET | Freq: Once | ORAL | Status: AC
  Administered 2024-04-21: 600 ug via ORAL
  Filled 2024-04-21: qty 3

## 2024-04-21 MED ORDER — AMISULPRIDE (ANTIEMETIC) 5 MG/2ML IV SOLN
INTRAVENOUS | Status: AC
  Filled 2024-04-21: qty 4

## 2024-04-21 MED ORDER — ROCURONIUM BROMIDE 10 MG/ML (PF) SYRINGE
PREFILLED_SYRINGE | INTRAVENOUS | Status: AC
  Filled 2024-04-21: qty 10

## 2024-04-21 MED ORDER — INSULIN ASPART 100 UNIT/ML IJ SOLN: 0.0000 [IU] | INTRAMUSCULAR | Status: DC | PRN

## 2024-04-21 MED ORDER — ONDANSETRON HCL 4 MG/2ML IJ SOLN
INTRAMUSCULAR | Status: DC | PRN
  Administered 2024-04-21: 4 mg via INTRAVENOUS

## 2024-04-21 MED ORDER — GENTAMICIN SULFATE 40 MG/ML IJ SOLN
500.0000 mg | INTRAVENOUS | Status: AC
  Administered 2024-04-21: 500 mg via INTRAVENOUS
  Filled 2024-04-21: qty 12.5

## 2024-04-21 MED ORDER — HYDROMORPHONE HCL 1 MG/ML IJ SOLN
0.2500 mg | INTRAMUSCULAR | Status: DC | PRN
  Administered 2024-04-21 (×2): 0.5 mg via INTRAVENOUS

## 2024-04-21 MED ORDER — 0.9 % SODIUM CHLORIDE (POUR BTL) OPTIME
TOPICAL | Status: DC | PRN
  Administered 2024-04-21: 1000 mL

## 2024-04-21 MED ORDER — LIDOCAINE 2% (20 MG/ML) 5 ML SYRINGE
INTRAMUSCULAR | Status: DC | PRN
  Administered 2024-04-21: 60 mg via INTRAVENOUS

## 2024-04-21 MED ORDER — PHENYLEPHRINE 80 MCG/ML (10ML) SYRINGE FOR IV PUSH (FOR BLOOD PRESSURE SUPPORT)
PREFILLED_SYRINGE | INTRAVENOUS | Status: DC | PRN
  Administered 2024-04-21 (×2): 80 ug via INTRAVENOUS

## 2024-04-21 MED ORDER — LACTATED RINGERS IV SOLN: INTRAVENOUS | Status: DC

## 2024-04-21 MED ORDER — CLINDAMYCIN PHOSPHATE 900 MG/50ML IV SOLN
INTRAVENOUS | Status: AC
  Filled 2024-04-21: qty 50

## 2024-04-21 MED ORDER — PROPOFOL 10 MG/ML IV BOLUS
INTRAVENOUS | Status: DC | PRN
  Administered 2024-04-21: 40 mg via INTRAVENOUS
  Administered 2024-04-21: 200 mg via INTRAVENOUS

## 2024-04-21 MED ORDER — ALBUMIN HUMAN 5 % IV SOLN: INTRAVENOUS | Status: DC | PRN

## 2024-04-21 MED ORDER — SOD CITRATE-CITRIC ACID 500-334 MG/5ML PO SOLN: 30.0000 mL | ORAL | Status: DC

## 2024-04-21 MED ORDER — FENTANYL CITRATE (PF) 250 MCG/5ML IJ SOLN
INTRAMUSCULAR | Status: DC | PRN
  Administered 2024-04-21 (×5): 50 ug via INTRAVENOUS

## 2024-04-21 MED ORDER — POVIDONE-IODINE 10 % EX SWAB
2.0000 | Freq: Once | CUTANEOUS | Status: AC
  Administered 2024-04-21: 2 via TOPICAL

## 2024-04-21 MED ORDER — BUPIVACAINE HCL (PF) 0.5 % IJ SOLN
INTRAMUSCULAR | Status: DC | PRN
  Administered 2024-04-21: 22 mL

## 2024-04-21 MED ORDER — MIDAZOLAM HCL 2 MG/2ML IJ SOLN
INTRAMUSCULAR | Status: AC
  Filled 2024-04-21: qty 2

## 2024-04-21 MED ORDER — AMISULPRIDE (ANTIEMETIC) 5 MG/2ML IV SOLN
10.0000 mg | Freq: Once | INTRAVENOUS | Status: AC
  Administered 2024-04-21: 10 mg via INTRAVENOUS

## 2024-04-21 MED ORDER — DEXMEDETOMIDINE HCL IN NACL 80 MCG/20ML IV SOLN
INTRAVENOUS | Status: DC | PRN
  Administered 2024-04-21: 12 ug via INTRAVENOUS

## 2024-04-21 MED ORDER — FENTANYL CITRATE (PF) 250 MCG/5ML IJ SOLN
INTRAMUSCULAR | Status: AC
  Filled 2024-04-21: qty 5

## 2024-04-21 MED ORDER — INSULIN ASPART 100 UNIT/ML IJ SOLN
4.0000 [IU] | Freq: Once | INTRAMUSCULAR | Status: AC
  Administered 2024-04-21: 4 [IU] via SUBCUTANEOUS

## 2024-04-21 MED ORDER — VASOPRESSIN 20 UNIT/ML IV SOLN
INTRAVENOUS | Status: AC
  Filled 2024-04-21: qty 1

## 2024-04-21 MED ORDER — ROCURONIUM BROMIDE 10 MG/ML (PF) SYRINGE
PREFILLED_SYRINGE | INTRAVENOUS | Status: DC | PRN
  Administered 2024-04-21: 20 mg via INTRAVENOUS
  Administered 2024-04-21: 80 mg via INTRAVENOUS
  Administered 2024-04-21: 20 mg via INTRAVENOUS

## 2024-04-21 MED ORDER — TRANEXAMIC ACID-NACL 1000-0.7 MG/100ML-% IV SOLN
INTRAVENOUS | Status: AC
  Filled 2024-04-21: qty 100

## 2024-04-21 MED ORDER — SUGAMMADEX SODIUM 200 MG/2ML IV SOLN
INTRAVENOUS | Status: DC | PRN
  Administered 2024-04-21: 200 mg via INTRAVENOUS

## 2024-04-21 MED ORDER — CLINDAMYCIN PHOSPHATE 900 MG/50ML IV SOLN
900.0000 mg | INTRAVENOUS | Status: AC
  Administered 2024-04-21: 900 mg via INTRAVENOUS

## 2024-04-21 MED ORDER — CHLORHEXIDINE GLUCONATE 0.12 % MT SOLN
OROMUCOSAL | Status: AC
  Filled 2024-04-21: qty 15

## 2024-04-21 MED ORDER — MISOPROSTOL 200 MCG PO TABS
ORAL_TABLET | ORAL | Status: AC
  Filled 2024-04-21: qty 3

## 2024-04-21 MED ORDER — HYDROMORPHONE HCL 1 MG/ML IJ SOLN
INTRAMUSCULAR | Status: AC
  Filled 2024-04-21: qty 1

## 2024-04-21 MED ORDER — ONDANSETRON HCL 4 MG/2ML IJ SOLN
INTRAMUSCULAR | Status: AC
  Filled 2024-04-21: qty 2

## 2024-04-21 MED ORDER — CHLORHEXIDINE GLUCONATE 0.12 % MT SOLN
15.0000 mL | Freq: Once | OROMUCOSAL | Status: AC
  Administered 2024-04-21: 15 mL via OROMUCOSAL

## 2024-04-21 MED ORDER — DEXAMETHASONE SOD PHOSPHATE PF 10 MG/ML IJ SOLN
INTRAMUSCULAR | Status: DC | PRN
  Administered 2024-04-21: 10 mg via INTRAVENOUS

## 2024-04-21 MED ORDER — SODIUM CHLORIDE 0.9 % IR SOLN
Status: DC | PRN
  Administered 2024-04-21: 3000 mL
  Administered 2024-04-21: 1000 mL
  Administered 2024-04-21 (×3): 3000 mL

## 2024-04-21 MED ORDER — ACETAMINOPHEN 500 MG PO TABS
1000.0000 mg | ORAL_TABLET | ORAL | Status: AC
  Administered 2024-04-21: 1000 mg via ORAL

## 2024-04-21 MED ORDER — METHYLENE BLUE 20 MG/2ML IV SOSY
PREFILLED_SYRINGE | INTRAVENOUS | Status: AC
  Filled 2024-04-21: qty 2

## 2024-04-21 MED ORDER — MONSELS FERRIC SUBSULFATE EX SOLN
CUTANEOUS | Status: AC
  Filled 2024-04-21: qty 8

## 2024-04-21 MED ORDER — TRANEXAMIC ACID-NACL 1000-0.7 MG/100ML-% IV SOLN: 1000.0000 mg | INTRAVENOUS | Status: DC

## 2024-04-21 MED ORDER — HEMOSTATIC AGENTS (NO CHARGE) OPTIME
TOPICAL | Status: DC | PRN
  Administered 2024-04-21: 1 via TOPICAL

## 2024-04-21 MED ORDER — SODIUM CHLORIDE (PF) 0.9 % IJ SOLN
PREFILLED_SYRINGE | INTRAVENOUS | Status: DC | PRN
  Administered 2024-04-21 (×2): 50 mL via SURGICAL_CAVITY

## 2024-04-21 MED ORDER — VASOPRESSIN 20 UNIT/ML IV SOLN
INTRAVENOUS | Status: DC | PRN
  Administered 2024-04-21: 10 mL via SURGICAL_CAVITY
  Administered 2024-04-21: 62 mL via SURGICAL_CAVITY

## 2024-04-21 MED ORDER — MIDAZOLAM HCL (PF) 2 MG/2ML IJ SOLN
INTRAMUSCULAR | Status: DC | PRN
  Administered 2024-04-21: 2 mg via INTRAVENOUS

## 2024-04-21 MED ORDER — ORAL CARE MOUTH RINSE: 15.0000 mL | Freq: Once | OROMUCOSAL | Status: AC

## 2024-04-21 MED ORDER — ACETAMINOPHEN 500 MG PO TABS
ORAL_TABLET | ORAL | Status: DC
Start: 2024-04-21 — End: 2024-04-21
  Filled 2024-04-21: qty 2

## 2024-04-21 SURGICAL SUPPLY — 67 items
BAG WASTE FLUENT PRO FLT205 (KITS) IMPLANT
COVER BACK TABLE 60X90IN (DRAPES) ×2 IMPLANT
COVER MAYO STAND STRL (DRAPES) ×2 IMPLANT
COVER TIP SHEARS 8 DVNC (MISCELLANEOUS) ×2 IMPLANT
DEFOGGER SCOPE WARM SEASHARP (MISCELLANEOUS) ×2 IMPLANT
DERMABOND ADVANCED .7 DNX12 (GAUZE/BANDAGES/DRESSINGS) ×2 IMPLANT
DRAPE ARM DVNC X/XI (DISPOSABLE) ×8 IMPLANT
DRAPE COLUMN DVNC XI (DISPOSABLE) ×2 IMPLANT
DRAPE SURG IRRIG POUCH 19X23 (DRAPES) ×2 IMPLANT
DRAPE UTILITY XL STRL (DRAPES) ×2 IMPLANT
DRIVER NDL MEGA SUTCUT DVNCXI (INSTRUMENTS) ×2 IMPLANT
DRIVER NDLE MEGA SUTCUT DVNCXI (INSTRUMENTS) ×2 IMPLANT
DURAPREP 26ML APPLICATOR (WOUND CARE) ×2 IMPLANT
ELECTRODE REM PT RTRN 9FT ADLT (ELECTROSURGICAL) ×2 IMPLANT
FORCEPS BPLR 8 MD DVNC XI (FORCEP) IMPLANT
FORCEPS PROGRASP DVNC XI (FORCEP) ×2 IMPLANT
FORCEPS TENACULUM DVNC XI (FORCEP) IMPLANT
GAUZE 4X4 16PLY ~~LOC~~+RFID DBL (SPONGE) IMPLANT
GLOVE BIOGEL PI IND STRL 6 (GLOVE) IMPLANT
GLOVE BIOGEL PI IND STRL 7.0 (GLOVE) ×4 IMPLANT
GLOVE NEODERM STER SZ 7 (GLOVE) ×6 IMPLANT
GLOVE SURG UNDER POLY LF SZ7 (GLOVE) ×2 IMPLANT
GOWN STRL REUS W/ TWL XL LVL3 (GOWN DISPOSABLE) ×2 IMPLANT
GOWN STRL SURGICAL XL XLNG (GOWN DISPOSABLE) IMPLANT
HOLDER FOLEY CATH W/STRAP (MISCELLANEOUS) IMPLANT
IRRIGATION SUCT STRKRFLW 2 WTP (MISCELLANEOUS) ×2 IMPLANT
IV 0.9% NACL 1000 ML (IV SOLUTION) IMPLANT
KIT PINK PAD W/HEAD ARM REST (MISCELLANEOUS) ×2 IMPLANT
KIT PROCED FLUENT PRO FLT212S (KITS) ×2 IMPLANT
KIT TURNOVER KIT B (KITS) ×2 IMPLANT
LEGGING LITHOTOMY PAIR STRL (DRAPES) ×2 IMPLANT
MANIFOLD NEPTUNE II (INSTRUMENTS) ×2 IMPLANT
MORCELLATOR XCISE COR (MISCELLANEOUS) IMPLANT
NDL 18GX1X1/2 (RX/OR ONLY) (NEEDLE) IMPLANT
NDL ASPIRATION 22 (NEEDLE) IMPLANT
NDL SPNL 22GX7 QUINCKE BK (NEEDLE) IMPLANT
NEEDLE 18GX1X1/2 (RX/OR ONLY) (NEEDLE) ×2 IMPLANT
NEEDLE ASPIRATION 22 (NEEDLE) ×2 IMPLANT
NEEDLE SPNL 22GX7 QUINCKE BK (NEEDLE) ×4 IMPLANT
OBTURATOR OPTICALSTD 8 DVNC (TROCAR) ×2 IMPLANT
PACK ROBOT WH (CUSTOM PROCEDURE TRAY) ×2 IMPLANT
PACK ROBOTIC GOWN (GOWN DISPOSABLE) ×2 IMPLANT
PAD OB MATERNITY 11 LF (PERSONAL CARE ITEMS) ×2 IMPLANT
POWDER SURGICEL 3.0 GRAM (HEMOSTASIS) IMPLANT
SCISSORS MNPLR CVD DVNC XI (INSTRUMENTS) ×2 IMPLANT
SCOPETTES 8 STERILE (MISCELLANEOUS) ×2 IMPLANT
SEAL ROD LENS SCOPE MYOSURE (ABLATOR) ×2 IMPLANT
SEAL UNIV 5-12 XI (MISCELLANEOUS) ×6 IMPLANT
SEALER VESSEL EXT DVNC XI (MISCELLANEOUS) IMPLANT
SET CYSTO IRRIGATION (SET/KITS/TRAYS/PACK) ×2 IMPLANT
SET TUBE SMOKE EVAC HIGH FLOW (TUBING) ×2 IMPLANT
SOL .9 NS 3000ML IRR UROMATIC (IV SOLUTION) IMPLANT
SOLN 0.9% NACL POUR BTL 1000ML (IV SOLUTION) ×2 IMPLANT
SPIKE FLUID TRANSFER (MISCELLANEOUS) ×2 IMPLANT
SUT MNCRL AB 4-0 PS2 18 (SUTURE) ×2 IMPLANT
SUT VICRYL 0 UR6 27IN ABS (SUTURE) IMPLANT
SUT VLOC 180 0 9IN GS21 (SUTURE) ×4 IMPLANT
SUT VLOC 180 2-0 9IN GS21 (SUTURE) IMPLANT
SYR 3ML LL SCALE MARK (SYRINGE) IMPLANT
SYR CONTROL 10ML LL (SYRINGE) IMPLANT
SYSTEM TISS REMOVAL MYOSURE XL (MISCELLANEOUS) IMPLANT
SYSTEM TISSUE EXTR PNEUMOLINER (MISCELLANEOUS) IMPLANT
TIP ENDOSCOPIC SURGICEL (TIP) IMPLANT
TIP UTERINE 6.7X10CM GRN DISP (MISCELLANEOUS) IMPLANT
TOWEL GREEN STERILE (TOWEL DISPOSABLE) ×2 IMPLANT
TRAY FOLEY W/BAG SLVR 14FR (SET/KITS/TRAYS/PACK) ×2 IMPLANT
UNDERPAD 30X36 HEAVY ABSORB (UNDERPADS AND DIAPERS) ×2 IMPLANT

## 2024-04-21 NOTE — Op Note (Signed)
 04/21/2024 Thu Demetrios [id number]    OPERATIVE REPORT  Preop Diagnosis: Menorrhagia, dysmenorrhea, pelvic pain and discomfort from fibroids, anemia Post operative diagnosis: same  Procedure: Myosure hysteroscopy Dilation and curettage, endocervical curettage, robotic myomectomy with olympus bag and morcellation under direct visualization, chromopertubation  Surgeon: Dr. Almarie Rollo Carpen Assistant: Judyann Rattler, RN   Fluids: please see anesthesia report Fluid deficit: 400cc  Complications: None Anesthesia: General   Findings: Normal appearing cervix and uterine cavity measuring 10cm with both ostia seen with large submucosal fibroid seen. Portion was removed with the myosure xcel and rest with the robot myomectomy   Normal appendix. No endometriomas or abnormal ovarian cysts seen.  Both ovaries were normal. Both fallopian tubes were normal and had normal fibria and appeared healthy. Right tubal patency, but should repeat with HSG after surgical healing, since left is likely patent as well but could have had a spasm during the procedure 10  total fibroids removed ranging from 1cm to 5cm. Would recommend a cesarean for delivery, if pregnant.   Estimated blood loss: 450cc  Specimens: Endometrial curettings, fibroids  Disposition of specimen: Pathology    Procedure: Patient was taken to the OR where she was placed in dorsal lithotomy in Allen stirrups. SCDs were in place.  The patient was prepped and draped in the usual sterile fashion. An adequate timeout was obtained and everyone agreed. A  foley catheter was used to drain her bladder throughout the procedure. A bivalve speculum was placed inside the vagina and the cervix visualized. The cervix was grasped anteriorly with a single-tooth tenaculum.  The uterus sounded to 10 cm. Sequential dilation was done to a #8 dilator, and the myosure hysteroscope was introduced into the uterine cavity. The above finding were  noted.    A sharp curettage was gently used to sample the endocervical curettage was performed and was sent to pathology. The xcel was used to remove the large portion of the submucosal fibroid. A uterine vcare manipulator was then advanced into the uterus to allow movement of the uterus and for the chromopertubation.  Attention was turned to the abdomen where an umbilical incision was made with the scalpel.  The robotic 8mm trocar and sleeve were then advanced without difficulty with the laparoscope under direct visualization into the abdomen.  The abdomen was then insufflated with carbon dioxide gas and adequate pneumoperitoneum was obtained.   Patient was placed in steep trendelenburg position. A detailed survey of the patient's pelvis and abdomen revealed the findings as mentioned above.  Three additional 8mm robotic ports were placed in the right and left lower quadrant under direct visualization.  The chromopertubation was performed and showed the above findings.  The abdomen was irrigated. Patient was given oral cytotec before surgery and TXA to help reduce blood loss during the case.   A diluted concentration of 20cc vasopressin in 100cc of normal saline was injected in the subserosa of the fibroids to reduce blood loss during the case as well.   Attention was turned to the two larger anterior intramural fibroids and vasopressin was used until blanching was noted at the base.  These were removed and looped together to facilitate removal at the end of the case with the vlock..  Smaller fibroids were also looped in this.  The posterior fibroid was removed and looped together.  The incisions on the uterus were repaired with several zer- v-locks with good hemostasis.  The fibroids were placed in the RUQ.  The robot was undocked.  The umbilical incision was extended to fit the olympus ring and bag.  The bag was inserted and the fibroids were placed in the bag.  The bag was brought up to the incision and  the cap was placed for the camera and Lina morcellator.  The fibroids were removed under direct visualization in pieces and sent pathology.  The remainder was removed manually.  The bag was removed.  The fascia was closed with zero v-lock on UR-6 needle. Surgicel was used for postoperative hemostasis as well. All instruments were then removed from the patient's abdomen under direct visualization. The uterine manipulator was removed without complications.  All incisions were closed with subcutaneous suture using 4.0 monocryl and Dermabond. The patient tolerated the procedures well.  All instruments, needles, and sponge counts were correct x 2. The patient was taken to the recovery room in stable condition.    All instrument, sponge and lap counts were correct x2. The patient was awakened taken to recovery room in stable condition.

## 2024-04-21 NOTE — Interval H&P Note (Signed)
 History and Physical Interval Note:  04/21/2024 7:08 AM  Tonya Gilbert  has presented today for surgery, with the diagnosis of Fibroids, menorrhagia with regular cycle.  The various methods of treatment have been discussed with the patient and family. After consideration of risks, benefits and other options for treatment, the patient has consented to  Procedure(s) with comments: MYOMECTOMY WITH MORCELLATION, ROBOT-ASSISTED (N/A) - with olympus bag and morcellation DILATATION & CURETTAGE/HYSTEROSCOPY WITH RESECTOCOPE (N/A) - Myosure, ECC MYOSURE RESECTION (N/A) CHROMOPERTUBATION, FALLOPIAN TUBE as a surgical intervention.  The patient's history has been reviewed, patient examined, no change in status, stable for surgery.  I have reviewed the patient's chart and labs.  Questions were answered to the patient's satisfaction.     [name]

## 2024-04-21 NOTE — Transfer of Care (Signed)
 Immediate Anesthesia Transfer of Care Note  Patient: Tonya Gilbert  Procedure(s) Performed: MYOMECTOMY WITH MORCELLATION, ROBOT-ASSISTED DILATATION & CURETTAGE/HYSTEROSCOPY MYOSURE RESECTION CHROMOPERTUBATION, FALLOPIAN TUBE  Patient Location: PACU  Anesthesia Type:General  Level of Consciousness: awake and sedated  Airway & Oxygen Therapy: Patient Spontanous Breathing and Patient connected to face mask oxygen  Post-op Assessment: Report given to RN and Post -op Vital signs reviewed and unstable, Anesthesiologist notified  Post vital signs: Reviewed and stable  Last Vitals:  Vitals Value Taken Time  BP 157/85 04/21/24 11:52  Temp    Pulse 88 04/21/24 11:56  Resp 25 04/21/24 11:56  SpO2 100 % 04/21/24 11:56  Vitals shown include unfiled device data.  Last Pain:  Vitals:   04/21/24 0558  TempSrc: Oral  PainSc: 0-No pain      Patients Stated Pain Goal: 6 (04/21/24 0558)  Complications: No notable events documented.

## 2024-04-21 NOTE — Anesthesia Procedure Notes (Signed)
 Procedure Name: Intubation Date/Time: 04/21/2024 7:34 AM  Performed by: Kearra Calkin C, CRNAPre-anesthesia Checklist: Patient identified, Emergency Drugs available, Suction available and Patient being monitored Patient Re-evaluated:Patient Re-evaluated prior to induction Oxygen Delivery Method: Circle System Utilized Preoxygenation: Pre-oxygenation with 100% oxygen Induction Type: IV induction Ventilation: Mask ventilation without difficulty Laryngoscope Size: Mac and 3 Grade View: Grade II Tube type: Oral Tube size: 7.0 mm Number of attempts: 1 Airway Equipment and Method: Stylet and Oral airway Placement Confirmation: ETT inserted through vocal cords under direct vision, positive ETCO2 and breath sounds checked- equal and bilateral Secured at: 21 cm Tube secured with: Tape Dental Injury: Teeth and Oropharynx as per pre-operative assessment

## 2024-04-21 NOTE — Anesthesia Postprocedure Evaluation (Signed)
 Anesthesia Post Note  Patient: Tonya Gilbert  Procedure(s) Performed: MYOMECTOMY WITH MORCELLATION, ROBOT-ASSISTED DILATATION & CURETTAGE/HYSTEROSCOPY MYOSURE RESECTION CHROMOPERTUBATION, FALLOPIAN TUBE     Patient location during evaluation: PACU Anesthesia Type: General Level of consciousness: awake and alert Pain management: pain level controlled Vital Signs Assessment: post-procedure vital signs reviewed and stable Respiratory status: spontaneous breathing, nonlabored ventilation and respiratory function stable Cardiovascular status: blood pressure returned to baseline and stable Postop Assessment: no apparent nausea or vomiting Anesthetic complications: no   No notable events documented.  Last Vitals:  Vitals:   04/21/24 1345 04/21/24 1400  BP: (!) 127/92 (!) 115/90  Pulse: 95 (!) 104  Resp: (!) 24 17  Temp:    SpO2: 94% 100%    Last Pain:  Vitals:   04/21/24 1304  TempSrc:   PainSc: 5                  Saryn Cherry,W. EDMOND

## 2024-04-21 NOTE — Progress Notes (Signed)
 Documentation for  phase II education and AVS  states Cordella is patient's spouse but is actually a relative, not specified.

## 2024-04-21 NOTE — Discharge Instructions (Signed)

## 2024-04-22 ENCOUNTER — Telehealth: Payer: Self-pay | Admitting: Obstetrics and Gynecology

## 2024-04-22 ENCOUNTER — Encounter (HOSPITAL_COMMUNITY): Payer: Self-pay | Admitting: Obstetrics and Gynecology

## 2024-04-22 NOTE — Telephone Encounter (Signed)
 Called for surgery follow-up.  NO answer. Left message to call or return with any concerns and surgery went well Dr. Glennon

## 2024-04-24 ENCOUNTER — Ambulatory Visit: Payer: Self-pay | Admitting: Obstetrics and Gynecology

## 2024-04-24 LAB — SURGICAL PATHOLOGY

## 2024-04-25 LAB — TYPE AND SCREEN
ABO/RH(D): O POS
Antibody Screen: NEGATIVE
Unit division: 0
Unit division: 0
Unit division: 0
Unit division: 0

## 2024-04-25 LAB — BPAM RBC
Blood Product Expiration Date: 202511232359
Blood Product Expiration Date: 202511232359
Blood Product Expiration Date: 202511232359
Blood Product Expiration Date: 202511232359
Unit Type and Rh: 5100
Unit Type and Rh: 5100
Unit Type and Rh: 5100
Unit Type and Rh: 5100

## 2024-05-04 ENCOUNTER — Other Ambulatory Visit: Payer: Self-pay

## 2024-05-04 ENCOUNTER — Encounter: Payer: Self-pay | Admitting: Obstetrics and Gynecology

## 2024-05-04 ENCOUNTER — Other Ambulatory Visit (HOSPITAL_COMMUNITY): Payer: Self-pay

## 2024-05-04 ENCOUNTER — Ambulatory Visit: Admitting: Obstetrics and Gynecology

## 2024-05-04 VITALS — BP 112/74 | HR 105 | Wt 214.2 lb

## 2024-05-04 DIAGNOSIS — Z09 Encounter for follow-up examination after completed treatment for conditions other than malignant neoplasm: Secondary | ICD-10-CM

## 2024-05-04 MED ORDER — NORETHINDRONE 0.35 MG PO TABS
1.0000 | ORAL_TABLET | Freq: Every day | ORAL | 11 refills | Status: DC
  Filled 2024-05-04: qty 28, 28d supply, fill #0

## 2024-05-04 MED ORDER — NORETHINDRONE 0.35 MG PO TABS
1.0000 | ORAL_TABLET | Freq: Every day | ORAL | 6 refills | Status: AC
  Filled 2024-05-04 – 2024-05-19 (×2): qty 84, 84d supply, fill #0
  Filled 2024-06-24: qty 84, 84d supply, fill #1

## 2024-05-04 NOTE — Progress Notes (Signed)
 Patient presents for 2 week postop from robotic myomectomy,  She is doing well. No fevers, VB, dysuria or severe abdominal pain.  BP 112/74   Pulse (!) 105   Wt 214 lb 3.2 oz (97.2 kg)   LMP 04/14/2024 (Exact Date)   SpO2 97%   BMI 36.20 kg/m   Abdomen: incisions I/c/d, NT, ND  A/p PO 2 weeks doing well To begin care with [facility] 3.  May return to work with restrictions with 2 more weeks with no lifting, pushing, pulling 15lbs. Then she is off all restrictions 4. Encouraged patient to continue micronor  until told to do otherwise with the infertility clinic. Dr. Glennon

## 2024-05-08 ENCOUNTER — Inpatient Hospital Stay: Attending: Hematology | Admitting: Hematology

## 2024-05-08 DIAGNOSIS — E119 Type 2 diabetes mellitus without complications: Secondary | ICD-10-CM | POA: Diagnosis not present

## 2024-05-08 DIAGNOSIS — E785 Hyperlipidemia, unspecified: Secondary | ICD-10-CM | POA: Diagnosis not present

## 2024-05-08 DIAGNOSIS — E559 Vitamin D deficiency, unspecified: Secondary | ICD-10-CM | POA: Diagnosis not present

## 2024-05-08 DIAGNOSIS — E669 Obesity, unspecified: Secondary | ICD-10-CM | POA: Diagnosis not present

## 2024-05-08 DIAGNOSIS — D649 Anemia, unspecified: Secondary | ICD-10-CM | POA: Diagnosis not present

## 2024-05-15 ENCOUNTER — Encounter: Payer: Self-pay | Admitting: Physician Assistant

## 2024-05-15 DIAGNOSIS — E559 Vitamin D deficiency, unspecified: Secondary | ICD-10-CM | POA: Diagnosis not present

## 2024-05-15 DIAGNOSIS — E288 Other ovarian dysfunction: Secondary | ICD-10-CM | POA: Diagnosis not present

## 2024-05-15 DIAGNOSIS — Z319 Encounter for procreative management, unspecified: Secondary | ICD-10-CM | POA: Diagnosis not present

## 2024-05-15 NOTE — Progress Notes (Signed)
 This encounter was created in error - please disregard.

## 2024-05-16 ENCOUNTER — Other Ambulatory Visit (HOSPITAL_COMMUNITY): Payer: Self-pay

## 2024-05-19 ENCOUNTER — Other Ambulatory Visit (HOSPITAL_COMMUNITY): Payer: Self-pay

## 2024-05-19 ENCOUNTER — Encounter: Payer: Self-pay | Admitting: Physician Assistant

## 2024-05-19 MED ORDER — METFORMIN HCL 1000 MG PO TABS
ORAL_TABLET | ORAL | 5 refills | Status: AC
Start: 2024-05-19 — End: ?
  Filled 2024-05-19: qty 60, 30d supply, fill #0

## 2024-05-25 ENCOUNTER — Other Ambulatory Visit (HOSPITAL_COMMUNITY): Payer: Self-pay

## 2024-05-25 ENCOUNTER — Encounter: Payer: Self-pay | Admitting: Physician Assistant

## 2024-05-25 MED ORDER — VITAMIN D HIGH POTENCY 1.25 MG (50000 UT) PO CAPS
50000.0000 [IU] | ORAL_CAPSULE | ORAL | 0 refills | Status: AC
  Filled 2024-05-25: qty 8, 56d supply, fill #0

## 2024-06-01 ENCOUNTER — Other Ambulatory Visit (HOSPITAL_COMMUNITY): Payer: Self-pay

## 2024-06-10 ENCOUNTER — Encounter: Admitting: Dietician

## 2024-06-24 ENCOUNTER — Other Ambulatory Visit: Payer: Self-pay | Admitting: Hematology

## 2024-06-25 ENCOUNTER — Other Ambulatory Visit: Payer: Self-pay

## 2024-06-26 ENCOUNTER — Encounter: Payer: Self-pay | Admitting: Physician Assistant

## 2024-06-26 ENCOUNTER — Other Ambulatory Visit (HOSPITAL_COMMUNITY): Payer: Self-pay

## 2024-06-26 ENCOUNTER — Other Ambulatory Visit: Payer: Self-pay

## 2024-06-26 MED ORDER — POTASSIUM CHLORIDE CRYS ER 20 MEQ PO TBCR
20.0000 meq | EXTENDED_RELEASE_TABLET | Freq: Two times a day (BID) | ORAL | 0 refills | Status: AC
  Filled 2024-06-26: qty 20, 10d supply, fill #0

## 2024-08-19 ENCOUNTER — Inpatient Hospital Stay: Admitting: Hematology

## 2024-08-19 ENCOUNTER — Inpatient Hospital Stay: Attending: Hematology

## 2024-12-18 ENCOUNTER — Ambulatory Visit: Admitting: Radiology
